# Patient Record
Sex: Male | Born: 1940 | Race: White | Hispanic: No | Smoking: Never smoker
Health system: Southern US, Community
[De-identification: ages and names within clinical notes are randomized; demographics above are authoritative.]

## PROBLEM LIST (undated history)

## (undated) DIAGNOSIS — I10 Essential (primary) hypertension: Secondary | ICD-10-CM

## (undated) DIAGNOSIS — I639 Cerebral infarction, unspecified: Secondary | ICD-10-CM

## (undated) DIAGNOSIS — I679 Cerebrovascular disease, unspecified: Secondary | ICD-10-CM

## (undated) DIAGNOSIS — K4090 Unilateral inguinal hernia, without obstruction or gangrene, not specified as recurrent: Secondary | ICD-10-CM

## (undated) DIAGNOSIS — N400 Enlarged prostate without lower urinary tract symptoms: Secondary | ICD-10-CM

## (undated) DIAGNOSIS — K625 Hemorrhage of anus and rectum: Secondary | ICD-10-CM

## (undated) DIAGNOSIS — G459 Transient cerebral ischemic attack, unspecified: Secondary | ICD-10-CM

## (undated) DIAGNOSIS — N183 Chronic kidney disease, stage 3 unspecified: Secondary | ICD-10-CM

## (undated) DIAGNOSIS — I8390 Asymptomatic varicose veins of unspecified lower extremity: Secondary | ICD-10-CM

## (undated) DIAGNOSIS — I219 Acute myocardial infarction, unspecified: Secondary | ICD-10-CM

## (undated) DIAGNOSIS — I2510 Atherosclerotic heart disease of native coronary artery without angina pectoris: Secondary | ICD-10-CM

## (undated) DIAGNOSIS — M542 Cervicalgia: Secondary | ICD-10-CM

## (undated) DIAGNOSIS — M79609 Pain in unspecified limb: Secondary | ICD-10-CM

## (undated) DIAGNOSIS — M1990 Unspecified osteoarthritis, unspecified site: Secondary | ICD-10-CM

## (undated) DIAGNOSIS — I6529 Occlusion and stenosis of unspecified carotid artery: Secondary | ICD-10-CM

## (undated) DIAGNOSIS — I509 Heart failure, unspecified: Secondary | ICD-10-CM

## (undated) DIAGNOSIS — F4321 Adjustment disorder with depressed mood: Secondary | ICD-10-CM

## (undated) DIAGNOSIS — L408 Other psoriasis: Secondary | ICD-10-CM

## (undated) DIAGNOSIS — K649 Unspecified hemorrhoids: Secondary | ICD-10-CM

## (undated) DIAGNOSIS — Z950 Presence of cardiac pacemaker: Secondary | ICD-10-CM

## (undated) DIAGNOSIS — F419 Anxiety disorder, unspecified: Secondary | ICD-10-CM

## (undated) HISTORY — DX: Benign prostatic hyperplasia without lower urinary tract symptoms: N40.0

## (undated) HISTORY — PX: COLONOSCOPY: SHX174

## (undated) HISTORY — PX: INGUINAL HERNIA REPAIR: SUR1180

## (undated) HISTORY — DX: Pain in unspecified limb: M79.609

## (undated) HISTORY — DX: Occlusion and stenosis of unspecified carotid artery: I65.29

## (undated) HISTORY — DX: Other psoriasis: L40.8

## (undated) HISTORY — DX: Unilateral inguinal hernia, without obstruction or gangrene, not specified as recurrent: K40.90

## (undated) HISTORY — DX: Cervicalgia: M54.2

## (undated) HISTORY — DX: Essential (primary) hypertension: I10

## (undated) HISTORY — DX: Transient cerebral ischemic attack, unspecified: G45.9

## (undated) HISTORY — PX: INSERT / REPLACE / REMOVE PACEMAKER: SUR710

## (undated) HISTORY — PX: VASCULAR SURGERY: SHX849

## (undated) HISTORY — PX: BACK SURGERY: SHX140

## (undated) HISTORY — DX: Unspecified hemorrhoids: K64.9

## (undated) HISTORY — DX: Asymptomatic varicose veins of unspecified lower extremity: I83.90

## (undated) HISTORY — DX: Cerebrovascular disease, unspecified: I67.9

## (undated) HISTORY — DX: Cerebral infarction, unspecified: I63.9

## (undated) HISTORY — PX: VASECTOMY: SHX75

---

## 1990-10-27 HISTORY — PX: LUMBAR DISC SURGERY: SHX700

## 1999-01-07 ENCOUNTER — Inpatient Hospital Stay (HOSPITAL_COMMUNITY): Admission: AD | Admit: 1999-01-07 | Discharge: 1999-01-15 | Payer: Self-pay | Admitting: *Deleted

## 1999-01-17 ENCOUNTER — Encounter (HOSPITAL_COMMUNITY): Admission: RE | Admit: 1999-01-17 | Discharge: 1999-04-17 | Payer: Self-pay

## 2000-02-19 ENCOUNTER — Inpatient Hospital Stay (HOSPITAL_COMMUNITY): Admission: EM | Admit: 2000-02-19 | Discharge: 2000-02-26 | Payer: Self-pay | Admitting: *Deleted

## 2000-02-27 ENCOUNTER — Other Ambulatory Visit: Admission: RE | Admit: 2000-02-27 | Discharge: 2000-03-06 | Payer: Self-pay

## 2002-03-21 ENCOUNTER — Ambulatory Visit (HOSPITAL_COMMUNITY): Admission: RE | Admit: 2002-03-21 | Discharge: 2002-03-21 | Payer: Self-pay | Admitting: Internal Medicine

## 2009-06-16 ENCOUNTER — Inpatient Hospital Stay (HOSPITAL_COMMUNITY): Admission: EM | Admit: 2009-06-16 | Discharge: 2009-06-19 | Payer: Self-pay | Admitting: Emergency Medicine

## 2009-06-18 ENCOUNTER — Encounter (INDEPENDENT_AMBULATORY_CARE_PROVIDER_SITE_OTHER): Payer: Self-pay | Admitting: Neurology

## 2011-02-01 LAB — LIPID PANEL
Cholesterol: 151 mg/dL (ref 0–200)
HDL: 28 mg/dL — ABNORMAL LOW (ref >39)
Total CHOL/HDL Ratio: 5.4 RATIO
VLDL: 42 mg/dL — ABNORMAL HIGH (ref 0–40)

## 2011-02-01 LAB — COMPREHENSIVE METABOLIC PANEL
AST: 30 U/L (ref 0–37)
BUN: 10 mg/dL (ref 6–23)
CO2: 28 mEq/L (ref 19–32)
Calcium: 8.6 mg/dL (ref 8.4–10.5)
Chloride: 105 mEq/L (ref 96–112)
Creatinine, Ser: 1.03 mg/dL (ref 0.4–1.5)
GFR calc Af Amer: >60 mL/min (ref >60)
GFR calc non Af Amer: >60 mL/min (ref >60)
Glucose, Bld: 179 mg/dL — ABNORMAL HIGH (ref 70–99)
Total Bilirubin: 1.4 mg/dL — ABNORMAL HIGH (ref 0.3–1.2)

## 2011-02-01 LAB — CBC
HCT: 42.6 % (ref 39.0–52.0)
MCHC: 34.7 g/dL (ref 30.0–36.0)
MCV: 92.9 fL (ref 78.0–100.0)
RBC: 4.58 MIL/uL (ref 4.22–5.81)
WBC: 12.8 10*3/uL — ABNORMAL HIGH (ref 4.0–10.5)

## 2011-02-01 LAB — GLUCOSE, CAPILLARY
Glucose-Capillary: 107 mg/dL — ABNORMAL HIGH (ref 70–99)
Glucose-Capillary: 121 mg/dL — ABNORMAL HIGH (ref 70–99)
Glucose-Capillary: 128 mg/dL — ABNORMAL HIGH (ref 70–99)

## 2011-02-01 LAB — PROTIME-INR
INR: 1 (ref 0.00–1.49)
Prothrombin Time: 13.4 seconds (ref 11.6–15.2)

## 2011-02-01 LAB — CARDIAC PANEL(CRET KIN+CKTOT+MB+TROPI): Total CK: 207 U/L (ref 7–232)

## 2011-02-01 LAB — HEMOGLOBIN A1C
Hgb A1c MFr Bld: 5.7 % (ref 4.6–6.1)
Mean Plasma Glucose: 117 mg/dL

## 2011-03-11 NOTE — Discharge Summary (Signed)
NAMEHILDRETH, ROBART               ACCOUNT NO.:  [id number]   MEDICAL RECORD NO.:  [id number]          PATIENT TYPE:  INP   LOCATION:  3017                         FACILITY:  [facility]   PHYSICIAN:  Pramod P. Pearlean Brownie, MD    DATE OF BIRTH:  09/24/1941   DATE OF ADMISSION:  06/16/2009  DATE OF DISCHARGE:  06/19/2009                               DISCHARGE SUMMARY   DIAGNOSES AT THE TIME OF DISCHARGE:  1. 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  2. Hypertension.  3. Benign prostatic hypertrophy.  4. Chronic pain involving right lower extremity.  5. Skin rash, bilateral lower extremities.   MEDICINES AT THE TIME OF DISCHARGE:  1. Amlodipine 5 mg a day.  2. Aspirin 325 mg a day.   STUDIES PERFORMED:  1. CT of the brain initially performed at [facility] shows no      acute abnormality.  2. CT angio of the neck shows 35% stenosis right ICA, 50% stenosis      proximal left ICA.  3. CT angio of the head shows mild chronic microvascular ischemic      changes throughout.  No acute hemorrhage, no significant stenosis,      or large vessel occlusion.  4. Chest x-ray shows no acute disease.  5. MRI of the brain shows scattered small acute infarct in left      hemisphere, 2 largest within the left parietal deep white matter,      cluster of infarct in the left occipital lobe 2 mm or smaller, no      sign of hemorrhage, swelling, or mass effect.  There is no chronic      small vessel disease elsewhere.  6. MRA of the head shows intracranial atherosclerotic diseases      affecting the small vessel, correctable proximal stenosis, fetal      origin of posterior cerebral arteries with dimunitive posterior      circulations.  7. A 2-D echocardiogram shows EF of 55% to 60% with no obvious source      of embolus.  8. Carotid Doppler not performed.  9. EKG shows normal sinus rhythm with sinus  arrhythmia, left axis      deviation.  No old EKGs to compare.   LABORATORY STUDIES:  Homocysteine 11.5, cholesterol 151, triglycerides  210, HDL 28, LDL 81, hemoglobin A1c 5.7.  Coagulations on admission  normal.  Chemistry with glucose 179, potassium 3.3, total bilirubin 1.4.  Liver function tests otherwise normal.  Cardiac enzymes with troponin-I  of 0.15 on admission with CK-MB of 4.8, otherwise normal.  CBC with  white blood cells of 12.8, otherwise normal.   HISTORY OF PRESENT ILLNESS:  [name] is a 70 year old  Caucasian male who experienced sudden onset of weakness involving the  right arm and leg as well as slurred speech.  Symptoms worsened by the  time he got to the hospital.  He had some difficulty with walking.  He  had marked weakness in the function  of right upper extremity.  He had no  previous history of stroke or TIA.  He had not been on antiplatelet  therapy.  CT of the brain showed no acute abnormality.  The patient was  deemed to be a candidate for t-PA at [facility] where he was  initially seen.  They said he was last seen normal around 2 p.m.  He  arrived at their hospital within 1 hour of his symptoms.  He was given  two-thirds t-PA and transferred to [facility] for further evaluation  including possible interventional radiology procedure if the patient did  not improve.  Fortunately, the patient did improve on his ride via  [facility] to [employer].  By the time he arrived, his speech was  essentially back to normal and the patient had good strength of his  right upper extremity and right lower extremity.  He was admitted to the  neuro ICU for further evaluation.   HOSPITAL COURSE:  Time of onset was clarified to be 11:30 when the  patient was last seen normal.  The patient first knew he had symptoms at  2 p.m. when he woke from a nap.  He was taken to [facility] where he was  given the two-thirds dose t-PA.  Per Dr. Marca Ancona admitting note  his  plan was to give an additional one-third t-PA upon arrival to [facility].  There is no documentation that this additional one-third was  given.  The drug was not mixed by the pharmacy nor charted given.  In  the neuro ICU, the patient continued to improve.  24 hours post t-PA, he  had an MRI, which showed no hemorrhage.  His deficits almost 100%  resolved when he was moved to the floor.  He was evaluated by PT and OT  and felt to have no rehab needs.  He was placed on aspirin for secondary  stroke prevention and educated on the IRIS trial for which he may be  involved in the future.   CONDITION AT DISCHARGE:  The patient alert and oriented x3.  No aphasia.  No dysarthria.  Eye movements are full.  Face is symmetric.  Tongue is  midline.  He has no drift in his upper extremities.  No focal weakness.  His heart rate is regular.  His breath sounds are clear.   DISCHARGE PLAN:  1. Discharged home with family.  2. No rehab needs.  3. Aspirin for secondary stroke prevention.  4. Consider IRIS trial.  5. Follow up primary care physician within 1 month.  6. Follow up Dr. Pearlean Brownie in 2 months.      Annie Main, N.P.    ______________________________  Sunny Schlein. Pearlean Brownie, MD    SB/MEDQ  D:  06/19/2009  T:  06/20/2009  Job:  [id number]

## 2011-03-11 NOTE — H&P (Signed)
NAMEJACARI, [name] NO.:  [id number]   MEDICAL RECORD NO.:  [id number]          PATIENT TYPE:  INP   LOCATION:  3111                         FACILITY:  [facility]   PHYSICIAN:  Noel Christmas, MD    DATE OF BIRTH:  [birth year]   DATE OF ADMISSION:  06/16/2009  DATE OF DISCHARGE:                              HISTORY & PHYSICAL   CHIEF COMPLAINT:  Acute onset of right-sided weakness and speech  difficulty.   HISTORY OF PRESENT ILLNESS:  This is a 70 year old man who experienced  sudden onset of weakness involving his right arm and right leg as well  as slurred speech.  Symptoms worsened by the time he came to the  hospital.  He had some difficulty with walking.  He had marked weakness  and dysfunction of his right upper extremity.  No previous history of  stroke or TIA.  He has not been on antiplatelet therapy.  CT scan of his  head showed no acute intracranial abnormality.  The patient was deemed a  candidate for t-PA.  He had last been seen normal at 2 o'clock this  afternoon.  He arrived at the hospital within 1 hour of onset of his  symptoms.  He was given t-PA at two-thirds dose with plans to transfer  to [facility] for further evaluation including possible  interventional radiology procedure if the patient did not improve  significantly with t-PA.  Fortunately, he did improve significantly with  marked improvement in speech as well as right-sided weakness.  By the  time he arrived in the emergency room at [facility], his speech was  essentially back to normal and the patient had good strength of his  right upper extremity as well as right lower extremity.  His only  complaint was residual feeling of numbness in his right upper extremity.   PAST MEDICAL HISTORY:  Remarkable for,  1. Hypertension.  2. Benign prostate hypertrophy.  3. Chronic pain involving his right lower extremity.  4. The patient is also being treated for a skin rash distally in  both      lower extremities.   CURRENT MEDICATIONS:  1. Amlodipine 5 mg per day.  2. Doxycycline 100 mg b.i.d.  3. Darvocet-N 100 one q.4 h. p.r.n. pain.   ALLERGIES:  PENICILLIN.   FAMILY HISTORY:  Positive for coronary artery disease and myocardial  infarctions as well as diabetes mellitus.  Family history is negative  for stroke.   SOCIAL HISTORY:  The patient is widowed.  He is employed as a Holiday representative at  [employer].  There is no history of tobacco abuse nor consumption of  alcohol.  There is also no history of illicit drug use.   REVIEW OF SYSTEMS:  NEUROLOGIC:  As above.  CARDIOVASCULAR:  Negative.  PULMONARY:  Negative.  GASTROINTESTINAL:  Negative.  GENITOURINARY:  Negative except for symptoms of BPH with reduced urinary stream.  MUSCULOSKELETAL:  Chronic right lower extremity pain.  ENDOCRINE:  Negative.  REPRODUCTIVE:  Negative.  HEMATOLOGIC/LYMPHATIC:  Negative.  IMMUNOLOGIC:  Negative. PSYCHIATRIC:  Negative.   PHYSICAL EXAMINATION:  VITAL SIGNS: Temperature was 97.5, blood pressure  141/88, heart rate 61 per minute and regular, respirations 12 per  minute, and oxygen saturation was 98% on 2 L of oxygen by nasal cannula.  GENERAL:  Appearance was that of moderately obese, late middle-aged  slightly elderly-appearing man who was alert and cooperative and in no  acute distress.  He had no receptive nor expressive aphasia.  Mental  status was normal.  HEAD, EYES, EARS, NOSE, AND THROAT:  Normal.  NECK:  Supple without tenderness.  CHEST:  Clear to auscultation.  CARDIAC:  Normal.  ABDOMEN:  Somewhat distended, but soft and nontender.  Bowel sounds were  normal.  SKIN:  Skin and mucosa were normal except for ring distribution area of  erythema involving both lower extremities, the distal legs.  EXTREMITIES:  Normal.  He had 2+ dorsal pedis pulses bilaterally.  GU:  Evaluation of genitalia and rectum was deferred.  NEUROLOGIC:  Pupils were equal.  Extraocular movements  and visual fields  were normal.  There is no facial numbness nor facial weakness.  Hearing  and speech were normal.  Coordination of his extremities was normal.  He  had no pronator drift of his upper extremities.  Strength was normal on  manual testing bilaterally.  Deep tendon reflexes were 2+ and  symmetrical in the upper extremities at the knees and absent at ankles.  Plantar responses were mute.  Sensory exam was normal except for reduced  tactile sensation of his right upper extremity compared to the left.  Carotid auscultation was normal.   LABORATORY STUDIES:  WBC count was 10.7, hemoglobin 15.1, hematocrit  43.9, and platelet count was 286,000.  Serum sodium was 138, potassium  3.9, chloride 106, CO2 of 21, BUN 13, creatinine 0.99, glucose 105, and  calcium 9.08.   IMPRESSION:  Acute ischemic event, transient ischemic attack versus  stroke, involving the left middle cerebral artery territory.  The  patient has had a good response to t-PA.   PLAN:  1. We will give remaining one-third of t-PA IV.  2. MRI of the brain.  3. CTA of the head as well as of the neck to rule out any significant      signs of occlusive disease.  4. Echocardiogram.  5. Aspirin daily after 48 hours.  6. No indications for rehab intervention at this point.      Noel Christmas, MD  Electronically Signed     CS/MEDQ  D:  06/16/2009  T:  06/17/2009  Job:  [id number]

## 2011-03-14 NOTE — Op Note (Signed)
[facility]  Patient:    [name], [name] Visit Number: [id number] MRN: [id number]          Service Type: END Location: DAY Attending Physician:  [name] Dictated by:   [name], [name] Proc. Date: 03/21/02 Admit Date:  03/21/2002   CC:         [name], [name], [place], [place]   Operative Report  PROCEDURE:  Diagnostic EGD, followed by colonoscopy.  INDICATIONS:  The patient is a 70 year old gentleman referred at the courtesy of Dr. Christian Jefferson to further evaluate hematochezia and intermittent dark stools per rectum.  He moves his bowels on the order of 2-3 times daily.  He denies constipation.  He denies frank diarrhea.  He has not had any upper abdominal symptoms such as odynophagia, dysphagia, early satiety, reflux symptoms, nausea, or vomiting.  He has not lost any weight.  He tells me he had some rectal bleeding a couple of years ago for which he underwent a colonoscopy at [facility]. No significant findings were given.  There is no family history of colorectal neoplasia.  He not taking any nonsteroidal agents.  EGD and colonoscopy are now being done to further evaluate his symptoms.  It is notable that on Mar 18, 2002, his white count was 11.3.  H and H was 13.4 and 38.3.  MCV was 87.9.  Platelet count was 316,000.  This approach has been discussed with [name].  The potential risks, benefits, and alternatives have been reviewed and questions answered.  He is at low risk for conscious sedation.  Please see my handwritten for more information.  PROCEDURE NOTE:  O2 saturation, blood pressure, pulse, and respirations were monitored throughout the entire procedure.  Conscious sedation--Versed 3 mg IV, Demerol 75 mg IV in divided doses, Cetacaine spray for topical oropharyngeal anesthesia.  INSTRUMENTS:  Olympus video chip gastroscope and colonoscope.  EGD FINDINGS:  Examination of the tubular esophagus revealed no  mucosal abnormalities.  The EG junction was easily traversed in the stomach.  The gastric cavity was emptied and insufflated well with air.  Thorough examination of the gastric mucosa including retroflexed view of the proximal stomach and esophagogastric junction demonstrated no abnormalities.  The pylorus was patent and easily traversed.  DUODENUM:  The bulb and second portion appeared normal.  THERAPEUTIC/DIAGNOSTIC MANEUVERS PERFORMED:  None.  The patient tolerated the procedure well and was prepared for colonoscopy.  COLONOSCOPY:  Digital rectal examination revealed no abnormalities.  ENDOSCOPIC FINDINGS:  The prep was good.  RECTUM:  Examination of the rectal mucosa including retroflexed view of the anal verge revealed friable internal hemorrhoidal tissue as did the on-face view of the anal canal.  The rectal mucosa otherwise appeared normal.  COLON: The colonic mucosa was surveyed from the rectosigmoid junction through the left transverse and right colon to the area of the appendiceal orifice. The colonic mucosa all the way to the cecum appeared normal.  The cecum, ileocecal valve, and appendiceal orifice were well-seen and photographed for the record.  The terminal ileum, the distal 10 cm, appeared normal.  From this level, the scope was slowly withdrawn.  All previously mentioned mucosal surfaces were again seen, and no other abnormalities were observed.  The patient tolerated both procedures well and was reacted in endoscopy.  IMPRESSION:  Esophagogastroduodenoscopy, normal esophagus, stomach, and duodenum through the second portion.  COLONOSCOPY FINDINGS: 1. Anal canal hemorrhoids; otherwise, normal rectum. 2. Normal colon. 3. Normal terminal ileum.  I suspect the  patient has experienced some bleeding largely from the inner rectum.  RECOMMENDATIONS: 1. Hemorrhoid literature given to [name]. 2. A 10-day course of Anusol-HC suppositories, one per rectum at  bedtime. 3. Followup with [name]. 4. If bleeding recurs, he is to let me know. Dictated by:   [name], [name] Attending Physician:  [name] DD:  03/21/02 TD:  03/22/02 Job: [id number]
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
## 2013-06-07 DIAGNOSIS — R1310 Dysphagia, unspecified: Secondary | ICD-10-CM

## 2013-06-17 ENCOUNTER — Encounter (INDEPENDENT_AMBULATORY_CARE_PROVIDER_SITE_OTHER): Payer: Self-pay | Admitting: *Deleted

## 2013-06-24 ENCOUNTER — Other Ambulatory Visit: Payer: Self-pay | Admitting: *Deleted

## 2013-06-24 ENCOUNTER — Other Ambulatory Visit (INDEPENDENT_AMBULATORY_CARE_PROVIDER_SITE_OTHER): Payer: Self-pay | Admitting: *Deleted

## 2013-06-24 DIAGNOSIS — I6350 Cerebral infarction due to unspecified occlusion or stenosis of unspecified cerebral artery: Secondary | ICD-10-CM

## 2013-06-24 DIAGNOSIS — Z1211 Encounter for screening for malignant neoplasm of colon: Secondary | ICD-10-CM

## 2013-06-28 ENCOUNTER — Encounter: Payer: Self-pay | Admitting: Surgery

## 2013-07-01 ENCOUNTER — Encounter: Payer: Self-pay | Admitting: Surgery

## 2013-07-04 ENCOUNTER — Encounter: Payer: Self-pay | Admitting: Surgery

## 2013-07-04 ENCOUNTER — Ambulatory Visit (INDEPENDENT_AMBULATORY_CARE_PROVIDER_SITE_OTHER): Payer: Medicare Other | Admitting: Surgery

## 2013-07-04 ENCOUNTER — Other Ambulatory Visit: Payer: Self-pay

## 2013-07-04 ENCOUNTER — Other Ambulatory Visit (INDEPENDENT_AMBULATORY_CARE_PROVIDER_SITE_OTHER): Payer: Medicare Other | Admitting: *Deleted

## 2013-07-04 DIAGNOSIS — I6350 Cerebral infarction due to unspecified occlusion or stenosis of unspecified cerebral artery: Secondary | ICD-10-CM

## 2013-07-04 DIAGNOSIS — Z01810 Encounter for preprocedural cardiovascular examination: Secondary | ICD-10-CM

## 2013-07-04 DIAGNOSIS — I6529 Occlusion and stenosis of unspecified carotid artery: Secondary | ICD-10-CM

## 2013-07-04 MED ORDER — ATORVASTATIN CALCIUM 10 MG PO TABS: 10.0000 mg | ORAL_TABLET | Freq: Every day | ORAL | Status: DC

## 2013-07-04 NOTE — Progress Notes (Addendum)
[facility] [place]   Patient name: [name] MRN: [id number] DOB: 1941-07-31 Sex: male   Referred by: Dr. Sherryll Burger  Reason for referral:  Chief Complaint  Patient presents with  . Carotid    new pt, carotid stenosis - Dr. Sherryll Burger    HISTORY OF PRESENT ILLNESS: This is a very pleasant gentleman who was referred for evaluation of carotid stenosis in the setting of a stroke. The patient has a history of a left brain stroke in 2010. At that time he presented with right-sided weakness and slurred speech. He was given TPA. His symptoms resolved. He had a CT angiogram which showed 30% right carotid stenosis and 50% left carotid stenosis. In early August of this year the patient had a similar episode with similar symptoms. An MRI showed a small left brain infarct without mass effect or hemorrhage. Duplex ultrasound showed greater than 70% bilateral carotid stenosis He was started on Plavix. He is continuing to get rehabilitation and physical therapy. His strength has improved. He still has residual numbness in his right arm.  The patient is medically managed for his hypertension. He is now taking Plavix as his antiplatelet agent. He stopped his aspirin. The patient is a nonsmoker.  Past Medical History  Diagnosis Date  . Carotid artery occlusion   . Cervicalgia   . Depressive disorder, not elsewhere classified   . BPH (benign prostatic hypertrophy)   . Pain in limb   . Unspecified transient cerebral ischemia   . Varicose veins   . Inguinal hernia without mention of obstruction or gangrene, unilateral or unspecified, (not specified as recurrent)   . Unspecified hemorrhoids without mention of complication   . Cerebrovascular disease, unspecified   . Hypertension   . Other psoriasis   . Backache, unspecified   . Stroke     Past Surgical History  Procedure Laterality Date  . Spine surgery  1992    Dr. Arletha Grippe  . Back surgery    . Hernia repair      History    Social History  . Marital Status: Widowed    Spouse Name: N/A    Number of Children: N/A  . Years of Education: N/A   Occupational History  . Not on file.   Social History Main Topics  . Smoking status: Never Smoker   . Smokeless tobacco: Never Used  . Alcohol Use: No  . Drug Use: No  . Sexual Activity: Not on file   Other Topics Concern  . Not on file   Social History Narrative  . No narrative on file    Family History  Problem Relation Age of Onset  . Heart attack Father   . Cancer Daughter     Allergies as of 07/04/2013 - Review Complete 07/04/2013  Allergen Reaction Noted  . Ace inhibitors  06/28/2013  . Cardura [doxazosin mesylate] Other (See Comments) 06/28/2013  . Penicillins Rash 06/28/2013    Current Outpatient Prescriptions on File Prior to Visit  Medication Sig Dispense Refill  . amLODipine (NORVASC) 10 MG tablet Take 10 mg by mouth daily.      . clopidogrel (PLAVIX) 75 MG tablet Take 75 mg by mouth daily.      . finasteride (PROSCAR) 5 MG tablet Take 5 mg by mouth daily.      . hydrochlorothiazide (HYDRODIURIL) 25 MG tablet Take 25 mg by mouth daily.      . hydrocortisone (ANUSOL-HC) 2.5 % rectal cream Place rectally 2 (two) times  daily.      . traMADol (ULTRAM) 50 MG tablet Take 50 mg by mouth as needed.       Marland Kitchen aspirin 81 MG tablet Take 81 mg by mouth daily.      . betamethasone dipropionate (DIPROLENE) 0.05 % cream Apply topically 2 (two) times daily.      . fexofenadine (ALLEGRA) 180 MG tablet Take 180 mg by mouth daily.      . metoprolol succinate (TOPROL-XL) 25 MG 24 hr tablet Take 25 mg by mouth daily.      . sertraline (ZOLOFT) 25 MG tablet Take 25 mg by mouth daily.       No current facility-administered medications on file prior to visit.     REVIEW OF SYSTEMS: Positive for pain in his legs with walking as well as swelling. Positive for right arm and leg weakness, slurred speech, dizziness. Positive for blood in his stool.  All other  systems are negative  PHYSICAL EXAMINATION: General: The patient appears their stated age.  Vital signs are BP 139/81  Pulse 75  Ht 5\' 8"  (1.727 m)  Wt 218 lb (98.884 kg)  BMI 33.15 kg/m2  SpO2 96% HEENT:  No gross abnormalities Pulmonary: Respirations are non-labored Abdomen: Soft and non-tender  Musculoskeletal: There are no major deformities.   Neurologic: Right upper extremity and lower extremity weakness Skin: There are no ulcer or rashes noted. Psychiatric: The patient has normal affect. Cardiovascular: There is a regular rate and rhythm without significant murmur appreciated. Palpable pedal pulses. No carotid bruit  Diagnostic Studies: Carotid duplex was repeated here today. This shows greater than 80% bilateral carotid stenosis. He has normal anatomy with a high bifurcation  Medication Changes: I started the patient on Lipitor 10 mg today. I also restarted his baby aspirin  Assessment:  Symptomatic left carotid stenosis Plan: The patient is approximately one month out from a left brain stroke. Imaging studies have revealed bilateral carotid stenosis greater than 80%. For stroke prevention, I have recommended left carotid endarterectomy and possible right carotid endarterectomy at a later date. We discussed the risks and benefits of surgery which include but are not limited to the risk of stroke, the risk of nerve injury, bleeding, cardiopulmonary complications. He is not on a statin, and therefore I started him on Lipitor. In addition I told him to continue taking his Plavix, but restart his baby aspirin. I have scheduled his left carotid endarterectomy for Thursday, September 18. The patient is going out of town this weekend and once to have his operation following his trip to [place]     V. Charlena Cross, [name] [facility] [place] Office: [phone] Pager:  [phone]

## 2013-07-07 ENCOUNTER — Encounter (HOSPITAL_COMMUNITY): Payer: Self-pay | Admitting: Respiratory Therapy

## 2013-07-12 ENCOUNTER — Encounter (HOSPITAL_COMMUNITY): Payer: Self-pay

## 2013-07-12 ENCOUNTER — Encounter (HOSPITAL_COMMUNITY)
Admission: RE | Admit: 2013-07-12 | Discharge: 2013-07-12 | Disposition: A | Discharge status: Home / Self Care | Payer: Medicare Other | Source: Ambulatory Visit | Attending: Surgery | Admitting: Surgery

## 2013-07-12 ENCOUNTER — Encounter (HOSPITAL_COMMUNITY)
Admission: RE | Admit: 2013-07-12 | Discharge: 2013-07-12 | Disposition: A | Discharge status: Home / Self Care | Payer: Medicare Other | Source: Ambulatory Visit | Attending: Anesthesiology | Admitting: Anesthesiology

## 2013-07-12 HISTORY — DX: Anxiety disorder, unspecified: F41.9

## 2013-07-12 HISTORY — DX: Unspecified osteoarthritis, unspecified site: M19.90

## 2013-07-12 HISTORY — DX: Benign prostatic hyperplasia without lower urinary tract symptoms: N40.0

## 2013-07-12 LAB — URINALYSIS, ROUTINE W REFLEX MICROSCOPIC
Bilirubin Urine: NEGATIVE
Hgb urine dipstick: NEGATIVE
Nitrite: NEGATIVE
Protein, ur: NEGATIVE mg/dL
Urobilinogen, UA: 0.2 mg/dL (ref 0.0–1.0)

## 2013-07-12 LAB — CBC
Hemoglobin: 15.4 g/dL (ref 13.0–17.0)
MCH: 31.6 pg (ref 26.0–34.0)
MCHC: 35.4 g/dL (ref 30.0–36.0)
Platelets: 324 10*3/uL (ref 150–400)
RDW: 12.9 % (ref 11.5–15.5)

## 2013-07-12 LAB — TYPE AND SCREEN
ABO/RH(D): O POS
Antibody Screen: NEGATIVE

## 2013-07-12 LAB — COMPREHENSIVE METABOLIC PANEL
ALT: 20 U/L (ref 0–53)
Albumin: 4.5 g/dL (ref 3.5–5.2)
Calcium: 9.8 mg/dL (ref 8.4–10.5)
GFR calc Af Amer: 58 mL/min — ABNORMAL LOW (ref >90)
Glucose, Bld: 113 mg/dL — ABNORMAL HIGH (ref 70–99)
Potassium: 3 mEq/L — ABNORMAL LOW (ref 3.5–5.1)
Sodium: 136 mEq/L (ref 135–145)
Total Protein: 8.2 g/dL (ref 6.0–8.3)

## 2013-07-12 LAB — APTT: aPTT: 31 seconds (ref 24–37)

## 2013-07-12 NOTE — Progress Notes (Signed)
Denies seeing a cardiologist. Denies having a card cath, stress test, ekg, cxr, or echo. PCP is Dr Kirstie Peri Dr [facility] office called (spoke with Darel Hong) about what time the pt needs to be here for surgery.  Darel Hong states that pt could be here at 0930 instead of 0730 as instructed before.  Pt voices understanding of pre-admit orders and the time to be here is 0930.

## 2013-07-12 NOTE — Pre-Procedure Instructions (Addendum)
[name]  07/12/2013   Your procedure is scheduled on:  Sept 18 1135  Report to [facility] at 0930 AM.  Call this number if you have problems the morning of surgery: [phone]   Remember:   Do not eat food or drink liquids after midnight.   Take these medicines the morning of surgery with A SIP OF WATER: Norvasc( amlodipine)  , Proscar (finasteride), Plavix, and Aspirin as directed by your Dr.  Stop taking Aleve, BC's, Goody's, Ibuprofen, Herbal medications, and Fish oil.   Do not wear jewelry, make-up or nail polish.  Do not wear lotions, powders, or perfumes. You may wear deodorant.  Do not shave 48 hours prior to surgery. Men may shave face and neck.  Do not bring valuables to the hospital.  [facility] is not responsible                   for any belongings or valuables.  Contacts, dentures or bridgework may not be worn into surgery.  Leave suitcase in the car. After surgery it may be brought to your room.  For patients admitted to the hospital, checkout time is 11:00 AM the day of  discharge.   Patients discharged the day of surgery will not be allowed to drive  home.    Special Instructions: Shower using CHG 2 nights before surgery and the night before surgery.  If you shower the day of surgery use CHG.  Use special wash - you have one bottle of CHG for all showers.  You should use approximately 1/3 of the bottle for each shower.   Please read over the following fact sheets that you were given: Pain Booklet, Coughing and Deep Breathing, Blood Transfusion Information, MRSA Information and Surgical Site Infection Prevention

## 2013-07-13 ENCOUNTER — Encounter (HOSPITAL_COMMUNITY): Payer: Self-pay

## 2013-07-13 ENCOUNTER — Other Ambulatory Visit: Payer: Self-pay | Admitting: *Deleted

## 2013-07-13 DIAGNOSIS — Z22322 Carrier or suspected carrier of Methicillin resistant Staphylococcus aureus: Secondary | ICD-10-CM

## 2013-07-13 MED ORDER — MUPIROCIN 2 % EX OINT: TOPICAL_OINTMENT | CUTANEOUS | Status: DC

## 2013-07-13 MED ORDER — VANCOMYCIN HCL IN DEXTROSE 1-5 GM/200ML-% IV SOLN
1000.0000 mg | INTRAVENOUS | Status: AC
  Administered 2013-07-14: 1000 mg via INTRAVENOUS
  Filled 2013-07-13: qty 200

## 2013-07-13 NOTE — Progress Notes (Signed)
Anesthesia Chart Review:  Patient is a 72 year old male scheduled for left CEA on 07/14/13 by Dr. Myra Gianotti.  History includes left brain CVA '10 s/p TPA and 05/2013 with mild residual RLE weakness, depression, anxiety, HTN, back and hernia surgeris, BPH, psoriasis.  PCP is Dr. Kirstie Peri.  EKG on 07/12/13 showed NSR, LAD, reverse r wave progression in V2-V3.  He denies history of MI, CHF, known CAD, prior stress or cath.  Echo on 06/23/13 (ordered by Dr. Sherryll Burger @ [facility]): Left ventricle normal in size and shape, EF 60-65%, abnormal diastolic function with E to A reversal, question of grade 1 diastolic dysfunction.  Grossly normal size RV with suspected prominent moderator band.  Mildly thickened MV with trace MR.  Mild TR, RVSP 20-30 mm Hg.  Mild to moderate AV sclerosis.  Aortic root is mildly sclerotic but normal in size.  Interatrial septum was not well seen. Consider repeat echo in 2-3 years or as clinically indicated, consider cardiac MRI to exclude significant pathology involving RV moderator band, consider TEE with saline contrast (Dr. Johny Shears).  Carotid duplex on 07/04/13 showed > 80% bilateral ICA stenosis.  CXR on 07/12/13 showed no acute cardiopulmonary abnormality seen.  Preoperative labs noted.  WBC 16.2, Cr 1.35, glucose 113. UA WNL.  Patient denies fever, cough, diarrhea.  He denies any "sick" symptoms.  Patient has mild leukocytosis but no obvious symptoms of infection.  Echo findings reviewed with anesthesiologist Dr. Sampson Goon.  Patient denies known cardiac history.  He will be further evaluated by his assigned anesthesiologist on the day of surgery.  If no acute changes then I would anticipate that he could proceed from an anesthesia standpoint.  Velna Ochs [facility]/Anesthesiology Phone [phone] 07/13/2013 12:20 PM

## 2013-07-14 ENCOUNTER — Encounter (HOSPITAL_COMMUNITY)
Admission: RE | Disposition: A | Discharge status: Home / Self Care | Payer: Self-pay | Source: Ambulatory Visit | Attending: Surgery

## 2013-07-14 ENCOUNTER — Inpatient Hospital Stay (HOSPITAL_COMMUNITY)
Admission: RE | Admit: 2013-07-14 | Discharge: 2013-07-16 | DRG: 038 | Disposition: A | Discharge status: Home / Self Care | Payer: Medicare Other | Source: Ambulatory Visit | Attending: Surgery | Admitting: Surgery

## 2013-07-14 ENCOUNTER — Encounter (HOSPITAL_COMMUNITY): Payer: Self-pay | Admitting: Surgery

## 2013-07-14 ENCOUNTER — Inpatient Hospital Stay (HOSPITAL_COMMUNITY): Payer: Medicare Other | Admitting: Anesthesiology

## 2013-07-14 ENCOUNTER — Encounter (HOSPITAL_COMMUNITY): Payer: Self-pay | Admitting: Vascular Surgery

## 2013-07-14 DIAGNOSIS — Z8673 Personal history of transient ischemic attack (TIA), and cerebral infarction without residual deficits: Secondary | ICD-10-CM

## 2013-07-14 DIAGNOSIS — I8390 Asymptomatic varicose veins of unspecified lower extremity: Secondary | ICD-10-CM | POA: Diagnosis present

## 2013-07-14 DIAGNOSIS — F3289 Other specified depressive episodes: Secondary | ICD-10-CM | POA: Diagnosis present

## 2013-07-14 DIAGNOSIS — I6529 Occlusion and stenosis of unspecified carotid artery: Secondary | ICD-10-CM

## 2013-07-14 DIAGNOSIS — K4090 Unilateral inguinal hernia, without obstruction or gangrene, not specified as recurrent: Secondary | ICD-10-CM | POA: Diagnosis present

## 2013-07-14 DIAGNOSIS — I10 Essential (primary) hypertension: Secondary | ICD-10-CM | POA: Diagnosis present

## 2013-07-14 DIAGNOSIS — F329 Major depressive disorder, single episode, unspecified: Secondary | ICD-10-CM | POA: Diagnosis present

## 2013-07-14 DIAGNOSIS — Z7902 Long term (current) use of antithrombotics/antiplatelets: Secondary | ICD-10-CM

## 2013-07-14 DIAGNOSIS — L408 Other psoriasis: Secondary | ICD-10-CM | POA: Diagnosis present

## 2013-07-14 DIAGNOSIS — N400 Enlarged prostate without lower urinary tract symptoms: Secondary | ICD-10-CM | POA: Diagnosis present

## 2013-07-14 DIAGNOSIS — D62 Acute posthemorrhagic anemia: Secondary | ICD-10-CM | POA: Diagnosis present

## 2013-07-14 DIAGNOSIS — E876 Hypokalemia: Secondary | ICD-10-CM | POA: Diagnosis present

## 2013-07-14 DIAGNOSIS — I658 Occlusion and stenosis of other precerebral arteries: Secondary | ICD-10-CM | POA: Diagnosis present

## 2013-07-14 DIAGNOSIS — R4789 Other speech disturbances: Secondary | ICD-10-CM

## 2013-07-14 HISTORY — PX: PATCH ANGIOPLASTY: SHX6230

## 2013-07-14 HISTORY — PX: ENDARTERECTOMY: SHX5162

## 2013-07-14 LAB — CREATININE, SERUM
Creatinine, Ser: 1.19 mg/dL (ref 0.50–1.35)
GFR calc Af Amer: 68 mL/min — ABNORMAL LOW (ref >90)
GFR calc non Af Amer: 58 mL/min — ABNORMAL LOW (ref >90)

## 2013-07-14 LAB — CBC
Platelets: 248 10*3/uL (ref 150–400)
RBC: 3.85 MIL/uL — ABNORMAL LOW (ref 4.22–5.81)
RDW: 12.9 % (ref 11.5–15.5)
WBC: 20.9 10*3/uL — ABNORMAL HIGH (ref 4.0–10.5)

## 2013-07-14 SURGERY — ENDARTERECTOMY, CAROTID
Anesthesia: General | Site: Neck | Laterality: Left | Wound class: Clean

## 2013-07-14 MED ORDER — SODIUM CHLORIDE 0.9 % IV SOLN
INTRAVENOUS | Status: DC
  Administered 2013-07-14: 18:00:00 via INTRAVENOUS

## 2013-07-14 MED ORDER — SUCCINYLCHOLINE CHLORIDE 20 MG/ML IJ SOLN
INTRAMUSCULAR | Status: DC | PRN
  Administered 2013-07-14: 120 mg via INTRAVENOUS

## 2013-07-14 MED ORDER — VANCOMYCIN HCL IN DEXTROSE 1-5 GM/200ML-% IV SOLN
1000.0000 mg | Freq: Once | INTRAVENOUS | Status: AC
  Administered 2013-07-15: 1000 mg via INTRAVENOUS
  Filled 2013-07-14: qty 200

## 2013-07-14 MED ORDER — TRIAMCINOLONE ACETONIDE 0.5 % EX CREA: TOPICAL_CREAM | Freq: Two times a day (BID) | CUTANEOUS | Status: DC | PRN

## 2013-07-14 MED ORDER — CLOPIDOGREL BISULFATE 75 MG PO TABS
75.0000 mg | ORAL_TABLET | Freq: Every day | ORAL | Status: DC
  Administered 2013-07-15 – 2013-07-16 (×2): 75 mg via ORAL
  Filled 2013-07-14 (×3): qty 1

## 2013-07-14 MED ORDER — 0.9 % SODIUM CHLORIDE (POUR BTL) OPTIME
TOPICAL | Status: DC | PRN
  Administered 2013-07-14: 2000 mL

## 2013-07-14 MED ORDER — NEOSTIGMINE METHYLSULFATE 1 MG/ML IJ SOLN
INTRAMUSCULAR | Status: DC | PRN
  Administered 2013-07-14: 4 mg via INTRAVENOUS

## 2013-07-14 MED ORDER — METOPROLOL TARTRATE 1 MG/ML IV SOLN: 2.0000 mg | INTRAVENOUS | Status: DC | PRN

## 2013-07-14 MED ORDER — PROPOFOL 10 MG/ML IV BOLUS
INTRAVENOUS | Status: DC | PRN
  Administered 2013-07-14: 200 mg via INTRAVENOUS
  Administered 2013-07-14: 20 mg via INTRAVENOUS

## 2013-07-14 MED ORDER — PHENOL 1.4 % MT LIQD: 1.0000 | OROMUCOSAL | Status: DC | PRN

## 2013-07-14 MED ORDER — ASPIRIN EC 81 MG PO TBEC
81.0000 mg | DELAYED_RELEASE_TABLET | Freq: Every day | ORAL | Status: DC
  Administered 2013-07-15 – 2013-07-16 (×2): 81 mg via ORAL
  Filled 2013-07-14 (×2): qty 1

## 2013-07-14 MED ORDER — PHENYLEPHRINE HCL 10 MG/ML IJ SOLN: 30.0000 ug/min | INTRAVENOUS | Status: DC

## 2013-07-14 MED ORDER — OXYCODONE-ACETAMINOPHEN 5-325 MG PO TABS
1.0000 | ORAL_TABLET | ORAL | Status: DC | PRN
  Administered 2013-07-15: 1 via ORAL
  Filled 2013-07-14: qty 1

## 2013-07-14 MED ORDER — METOPROLOL SUCCINATE ER 25 MG PO TB24
25.0000 mg | ORAL_TABLET | Freq: Every day | ORAL | Status: DC
  Administered 2013-07-15 – 2013-07-16 (×2): 25 mg via ORAL
  Filled 2013-07-14 (×3): qty 1

## 2013-07-14 MED ORDER — PROMETHAZINE HCL 25 MG/ML IJ SOLN: 6.2500 mg | INTRAMUSCULAR | Status: DC | PRN

## 2013-07-14 MED ORDER — SODIUM CHLORIDE 0.9 % IV SOLN
500.0000 mL | Freq: Once | INTRAVENOUS | Status: AC | PRN
  Administered 2013-07-14: 500 mL via INTRAVENOUS

## 2013-07-14 MED ORDER — LIDOCAINE HCL (PF) 1 % IJ SOLN
INTRAMUSCULAR | Status: AC
  Filled 2013-07-14: qty 30

## 2013-07-14 MED ORDER — LORATADINE 10 MG PO TABS
10.0000 mg | ORAL_TABLET | Freq: Every day | ORAL | Status: DC
  Administered 2013-07-14 – 2013-07-16 (×3): 10 mg via ORAL
  Filled 2013-07-14 (×3): qty 1

## 2013-07-14 MED ORDER — ACETAMINOPHEN 650 MG RE SUPP: 325.0000 mg | RECTAL | Status: DC | PRN

## 2013-07-14 MED ORDER — DEXTROSE 5 % IV SOLN
30.0000 ug/min | INTRAVENOUS | Status: AC
  Filled 2013-07-14: qty 2

## 2013-07-14 MED ORDER — MORPHINE SULFATE 2 MG/ML IJ SOLN
2.0000 mg | INTRAMUSCULAR | Status: DC | PRN
  Administered 2013-07-14: 4 mg via INTRAVENOUS
  Filled 2013-07-14: qty 2

## 2013-07-14 MED ORDER — OXYCODONE HCL 5 MG/5ML PO SOLN: 5.0000 mg | Freq: Once | ORAL | Status: DC | PRN

## 2013-07-14 MED ORDER — PROTAMINE SULFATE 10 MG/ML IV SOLN
INTRAVENOUS | Status: DC | PRN
  Administered 2013-07-14: 50 mg via INTRAVENOUS

## 2013-07-14 MED ORDER — SODIUM CHLORIDE 0.9 % IR SOLN
Status: DC | PRN
  Administered 2013-07-14: 13:00:00

## 2013-07-14 MED ORDER — ATORVASTATIN CALCIUM 10 MG PO TABS
10.0000 mg | ORAL_TABLET | Freq: Every day | ORAL | Status: DC
  Administered 2013-07-15 – 2013-07-16 (×2): 10 mg via ORAL
  Filled 2013-07-14 (×2): qty 1

## 2013-07-14 MED ORDER — DOPAMINE-DEXTROSE 3.2-5 MG/ML-% IV SOLN: 3.0000 ug/kg/min | INTRAVENOUS | Status: DC

## 2013-07-14 MED ORDER — ALBUMIN HUMAN 5 % IV SOLN
INTRAVENOUS | Status: DC | PRN
  Administered 2013-07-14 (×2): via INTRAVENOUS

## 2013-07-14 MED ORDER — LABETALOL HCL 5 MG/ML IV SOLN: 10.0000 mg | INTRAVENOUS | Status: DC | PRN

## 2013-07-14 MED ORDER — ROCURONIUM BROMIDE 100 MG/10ML IV SOLN
INTRAVENOUS | Status: DC | PRN
  Administered 2013-07-14: 20 mg via INTRAVENOUS
  Administered 2013-07-14: 50 mg via INTRAVENOUS
  Administered 2013-07-14: 10 mg via INTRAVENOUS

## 2013-07-14 MED ORDER — HYDRALAZINE HCL 20 MG/ML IJ SOLN: 10.0000 mg | INTRAMUSCULAR | Status: DC | PRN

## 2013-07-14 MED ORDER — LACTATED RINGERS IV SOLN
INTRAVENOUS | Status: DC | PRN
  Administered 2013-07-14 (×2): via INTRAVENOUS

## 2013-07-14 MED ORDER — POTASSIUM CHLORIDE CRYS ER 20 MEQ PO TBCR: 20.0000 meq | EXTENDED_RELEASE_TABLET | Freq: Once | ORAL | Status: AC | PRN

## 2013-07-14 MED ORDER — LIDOCAINE HCL (CARDIAC) 20 MG/ML IV SOLN
INTRAVENOUS | Status: DC | PRN
  Administered 2013-07-14: 80 mg via INTRAVENOUS

## 2013-07-14 MED ORDER — SODIUM CHLORIDE 0.9 % IV SOLN: INTRAVENOUS | Status: DC

## 2013-07-14 MED ORDER — GUAIFENESIN-DM 100-10 MG/5ML PO SYRP: 15.0000 mL | ORAL_SOLUTION | ORAL | Status: DC | PRN

## 2013-07-14 MED ORDER — ONDANSETRON HCL 4 MG/2ML IJ SOLN: 4.0000 mg | Freq: Four times a day (QID) | INTRAMUSCULAR | Status: DC | PRN

## 2013-07-14 MED ORDER — FINASTERIDE 5 MG PO TABS
5.0000 mg | ORAL_TABLET | Freq: Every day | ORAL | Status: DC
  Administered 2013-07-15 – 2013-07-16 (×2): 5 mg via ORAL
  Filled 2013-07-14 (×2): qty 1

## 2013-07-14 MED ORDER — FENTANYL CITRATE 0.05 MG/ML IJ SOLN
INTRAMUSCULAR | Status: DC | PRN
  Administered 2013-07-14: 50 ug via INTRAVENOUS
  Administered 2013-07-14: 25 ug via INTRAVENOUS
  Administered 2013-07-14: 100 ug via INTRAVENOUS
  Administered 2013-07-14: 25 ug via INTRAVENOUS
  Administered 2013-07-14: 50 ug via INTRAVENOUS

## 2013-07-14 MED ORDER — HYDROCORTISONE 2.5 % RE CREA: TOPICAL_CREAM | Freq: Two times a day (BID) | RECTAL | Status: DC | PRN

## 2013-07-14 MED ORDER — HEMOSTATIC AGENTS (NO CHARGE) OPTIME
TOPICAL | Status: DC | PRN
  Administered 2013-07-14: 1 via TOPICAL

## 2013-07-14 MED ORDER — DOCUSATE SODIUM 100 MG PO CAPS
100.0000 mg | ORAL_CAPSULE | Freq: Every day | ORAL | Status: DC
  Administered 2013-07-15 – 2013-07-16 (×2): 100 mg via ORAL
  Filled 2013-07-14 (×2): qty 1

## 2013-07-14 MED ORDER — ACETAMINOPHEN 325 MG PO TABS: 325.0000 mg | ORAL_TABLET | ORAL | Status: DC | PRN

## 2013-07-14 MED ORDER — ENOXAPARIN SODIUM 30 MG/0.3ML ~~LOC~~ SOLN
30.0000 mg | SUBCUTANEOUS | Status: DC
  Administered 2013-07-15: 30 mg via SUBCUTANEOUS
  Filled 2013-07-14 (×2): qty 0.3

## 2013-07-14 MED ORDER — MUPIROCIN 2 % EX OINT: TOPICAL_OINTMENT | Freq: Two times a day (BID) | CUTANEOUS | Status: DC

## 2013-07-14 MED ORDER — BISACODYL 10 MG RE SUPP: 10.0000 mg | Freq: Every day | RECTAL | Status: DC | PRN

## 2013-07-14 MED ORDER — PHENYLEPHRINE HCL 10 MG/ML IJ SOLN
INTRAMUSCULAR | Status: DC | PRN
  Administered 2013-07-14: 40 ug via INTRAVENOUS
  Administered 2013-07-14 (×3): 80 ug via INTRAVENOUS
  Administered 2013-07-14 (×3): 40 ug via INTRAVENOUS

## 2013-07-14 MED ORDER — MUPIROCIN 2 % EX OINT
TOPICAL_OINTMENT | Freq: Two times a day (BID) | CUTANEOUS | Status: DC
  Filled 2013-07-14: qty 22

## 2013-07-14 MED ORDER — HYDROMORPHONE HCL PF 1 MG/ML IJ SOLN: 0.2500 mg | INTRAMUSCULAR | Status: DC | PRN

## 2013-07-14 MED ORDER — HEPARIN SODIUM (PORCINE) 1000 UNIT/ML IJ SOLN
INTRAMUSCULAR | Status: DC | PRN
  Administered 2013-07-14: 9000 [IU] via INTRAVENOUS
  Administered 2013-07-14: 1000 [IU] via INTRAVENOUS
  Administered 2013-07-14: 2000 [IU] via INTRAVENOUS

## 2013-07-14 MED ORDER — MAGNESIUM SULFATE 40 MG/ML IJ SOLN: 2.0000 g | Freq: Once | INTRAMUSCULAR | Status: DC | PRN

## 2013-07-14 MED ORDER — MUPIROCIN 2 % EX OINT
TOPICAL_OINTMENT | CUTANEOUS | Status: AC
  Administered 2013-07-14: 1 via NASAL
  Filled 2013-07-14: qty 22

## 2013-07-14 MED ORDER — ASPIRIN 81 MG PO TABS: 81.0000 mg | ORAL_TABLET | Freq: Every day | ORAL | Status: DC

## 2013-07-14 MED ORDER — LACTATED RINGERS IV SOLN
INTRAVENOUS | Status: DC
  Administered 2013-07-14: 11:00:00 via INTRAVENOUS

## 2013-07-14 MED ORDER — SODIUM CHLORIDE 0.9 % IV SOLN
10.0000 mg | INTRAVENOUS | Status: DC | PRN
  Administered 2013-07-14: 30 ug/min via INTRAVENOUS

## 2013-07-14 MED ORDER — PANTOPRAZOLE SODIUM 40 MG PO TBEC
40.0000 mg | DELAYED_RELEASE_TABLET | Freq: Every day | ORAL | Status: DC
  Administered 2013-07-14 – 2013-07-16 (×3): 40 mg via ORAL
  Filled 2013-07-14 (×3): qty 1

## 2013-07-14 MED ORDER — MUPIROCIN 2 % EX OINT
TOPICAL_OINTMENT | Freq: Two times a day (BID) | CUTANEOUS | Status: DC
  Administered 2013-07-14: 1 via NASAL
  Administered 2013-07-14 – 2013-07-15 (×3): via NASAL
  Administered 2013-07-16: 1 via NASAL
  Filled 2013-07-14: qty 22

## 2013-07-14 MED ORDER — OXYCODONE HCL 5 MG PO TABS: 5.0000 mg | ORAL_TABLET | Freq: Four times a day (QID) | ORAL | Status: DC | PRN

## 2013-07-14 MED ORDER — ONDANSETRON HCL 4 MG/2ML IJ SOLN
INTRAMUSCULAR | Status: DC | PRN
  Administered 2013-07-14 (×2): 4 mg via INTRAVENOUS

## 2013-07-14 MED ORDER — AMLODIPINE BESYLATE 10 MG PO TABS
10.0000 mg | ORAL_TABLET | Freq: Every day | ORAL | Status: DC
  Administered 2013-07-15 – 2013-07-16 (×2): 10 mg via ORAL
  Filled 2013-07-14 (×2): qty 1

## 2013-07-14 MED ORDER — GLYCOPYRROLATE 0.2 MG/ML IJ SOLN
INTRAMUSCULAR | Status: DC | PRN
  Administered 2013-07-14: 0.6 mg via INTRAVENOUS
  Administered 2013-07-14: 0.2 mg via INTRAVENOUS

## 2013-07-14 MED ORDER — ALUM & MAG HYDROXIDE-SIMETH 200-200-20 MG/5ML PO SUSP: 15.0000 mL | ORAL | Status: DC | PRN

## 2013-07-14 MED ORDER — OXYCODONE HCL 5 MG PO TABS: 5.0000 mg | ORAL_TABLET | Freq: Once | ORAL | Status: DC | PRN

## 2013-07-14 MED ORDER — SENNOSIDES-DOCUSATE SODIUM 8.6-50 MG PO TABS
1.0000 | ORAL_TABLET | Freq: Every evening | ORAL | Status: DC | PRN
  Filled 2013-07-14: qty 1

## 2013-07-14 MED ORDER — HYDROCHLOROTHIAZIDE 25 MG PO TABS
25.0000 mg | ORAL_TABLET | Freq: Every day | ORAL | Status: DC
  Administered 2013-07-15 – 2013-07-16 (×2): 25 mg via ORAL
  Filled 2013-07-14 (×3): qty 1

## 2013-07-14 MED ORDER — ARTIFICIAL TEARS OP OINT
TOPICAL_OINTMENT | OPHTHALMIC | Status: DC | PRN
  Administered 2013-07-14: 1 via OPHTHALMIC

## 2013-07-14 SURGICAL SUPPLY — 49 items
ADH SKN CLS APL DERMABOND .7 (GAUZE/BANDAGES/DRESSINGS) ×1
CANISTER SUCTION 2500CC (MISCELLANEOUS) ×2 IMPLANT
CATH ROBINSON RED A/P 18FR (CATHETERS) ×2 IMPLANT
CATH SUCT 10FR WHISTLE TIP (CATHETERS) ×2 IMPLANT
CLIP TI MEDIUM 24 (CLIP) ×2 IMPLANT
CLIP TI WIDE RED SMALL 24 (CLIP) ×2 IMPLANT
COVER SURGICAL LIGHT HANDLE (MISCELLANEOUS) ×2 IMPLANT
CRADLE DONUT ADULT HEAD (MISCELLANEOUS) ×2 IMPLANT
DERMABOND ADVANCED (GAUZE/BANDAGES/DRESSINGS) ×1
DERMABOND ADVANCED .7 DNX12 (GAUZE/BANDAGES/DRESSINGS) ×1 IMPLANT
DRAIN CHANNEL 15F RND FF W/TCR (WOUND CARE) ×1 IMPLANT
DRAPE WARM FLUID 44X44 (DRAPE) ×2 IMPLANT
ELECT REM PT RETURN 9FT ADLT (ELECTROSURGICAL) ×2
ELECTRODE REM PT RTRN 9FT ADLT (ELECTROSURGICAL) ×1 IMPLANT
EVACUATOR SILICONE 100CC (DRAIN) ×1 IMPLANT
FELT TEFLON 4 X1 (Mesh General) ×1 IMPLANT
GLOVE BIO SURGEON STRL SZ 6.5 (GLOVE) ×2 IMPLANT
GLOVE BIO SURGEON STRL SZ7.5 (GLOVE) ×2 IMPLANT
GLOVE BIOGEL PI IND STRL 6.5 (GLOVE) IMPLANT
GLOVE BIOGEL PI IND STRL 7.5 (GLOVE) ×1 IMPLANT
GLOVE BIOGEL PI INDICATOR 6.5 (GLOVE) ×1
GLOVE BIOGEL PI INDICATOR 7.5 (GLOVE) ×1
GLOVE SURG SS PI 6.5 STRL IVOR (GLOVE) ×1 IMPLANT
GLOVE SURG SS PI 7.5 STRL IVOR (GLOVE) ×2 IMPLANT
GOWN PREVENTION PLUS XXLARGE (GOWN DISPOSABLE) ×2 IMPLANT
GOWN STRL NON-REIN LRG LVL3 (GOWN DISPOSABLE) ×7 IMPLANT
HEMOSTAT SNOW SURGICEL 2X4 (HEMOSTASIS) ×1 IMPLANT
INSERT FOGARTY SM (MISCELLANEOUS) IMPLANT
KIT BASIN OR (CUSTOM PROCEDURE TRAY) ×2 IMPLANT
KIT ROOM TURNOVER OR (KITS) ×2 IMPLANT
NS IRRIG 1000ML POUR BTL (IV SOLUTION) ×4 IMPLANT
PACK CAROTID (CUSTOM PROCEDURE TRAY) ×2 IMPLANT
PAD ARMBOARD 7.5X6 YLW CONV (MISCELLANEOUS) ×4 IMPLANT
PATCH VASCULAR VASCU GUARD 1X6 (Vascular Products) ×1 IMPLANT
SHUNT CAROTID BYPASS 10 (VASCULAR PRODUCTS) IMPLANT
SHUNT CAROTID BYPASS 12FRX15.5 (VASCULAR PRODUCTS) IMPLANT
SPONGE INTESTINAL PEANUT (DISPOSABLE) ×2 IMPLANT
SUT ETHILON 3 0 PS 1 (SUTURE) ×1 IMPLANT
SUT PROLENE 6 0 BV (SUTURE) ×9 IMPLANT
SUT PROLENE 7 0 BV 1 (SUTURE) IMPLANT
SUT PROLENE 7 0 BV1 MDA (SUTURE) ×1 IMPLANT
SUT SILK 3 0 TIES 17X18 (SUTURE)
SUT SILK 3-0 18XBRD TIE BLK (SUTURE) IMPLANT
SUT VIC AB 3-0 SH 27 (SUTURE) ×4
SUT VIC AB 3-0 SH 27X BRD (SUTURE) ×2 IMPLANT
SUT VICRYL 4-0 PS2 18IN ABS (SUTURE) ×2 IMPLANT
TOWEL OR 17X24 6PK STRL BLUE (TOWEL DISPOSABLE) ×2 IMPLANT
TOWEL OR 17X26 10 PK STRL BLUE (TOWEL DISPOSABLE) ×2 IMPLANT
WATER STERILE IRR 1000ML POUR (IV SOLUTION) ×2 IMPLANT

## 2013-07-14 NOTE — Progress Notes (Signed)
Pt seen in the PACU.  He is following commands and is able to wiggle fingers on both hands as well as grip with both hands and wiggles toes on both feet.  His tongue is midline.  Pt is doing well post operatively.  Doreatha Massed 07/14/2013 3:50 PM

## 2013-07-14 NOTE — OR Nursing (Signed)
Carotid duplex completed/  ICA open. Doreatha Massed PA at bedside,  She communicated same to physician. Speech is more fluid and cleare, orientation improved though thinks is in ICU

## 2013-07-14 NOTE — Op Note (Signed)
[facility] [place]  Patient name: [name] MRN: [id number] DOB: 1941-08-10 Sex: male  07/14/2013 Pre-operative Diagnosis: Symptomatic   left carotid stenosis Post-operative diagnosis:  Same Surgeon:  Jorge Ny Assistants:  S. Rhyne Procedure:    left carotid Endarterectomy with bovine pericardial patch angioplasty Anesthesia:  General Blood Loss:  See anesthesia record Specimens:  Carotid Plaque to pathology  Findings:  90 %stenosis; Thrombus:  none  Indications:  This is a 72 year old gentleman who suffered a stroke approximately 6 weeks ago. During his workup, he was found to have high grade, bilateral carotid stenosis. He has a previous stroke in 2010. He has residual right arm and leg weakness. He is able to function at a normal level. He comes in today for endarterectomy.  Procedure:  The patient was identified in the holding area and taken to [facility] OR ROOM 11  The patient was then placed supine on the table.   General endotrachial anesthesia was administered.  The patient was prepped and draped in the usual sterile fashion.  A time out was called and antibiotics were administered.  The incision was made along the anterior border of the left sternocleidomastoid muscle.  Cautery was used to dissect through the subcutaneous tissue.  The platysma muscle was divided with cautery.  The internal jugular vein was exposed along its anterior medial border.  The common facial vein was exposed and then divided between 2-0 silk ties and metal clips.  The common carotid artery was then circumferentially exposed and encircled with an umbilical tape.  The vagus nerve was identified and protected.  Next sharp dissection was used to expose the external carotid artery and the superior thyroid artery.  The were encircled with a blue vessel loop and a 2-0 silk tie respectively.  Finally, the internal carotid was carefully dissected free.  An umbilical tape was placed around  the internal carotid artery distal to the diseased segment.  The hypoglossal nerve was visualized throughout and protected.  The patient was given systemic heparinization.  A bovine carotid patch was selected and prepared on the back table.  A 10 french shunt was also prepared.  After blood pressure readings were appropriate and the heparin had been given time to circulate, the internal carotid artery was occluded with a baby Gregory clamp.  The external and common carotid arteries were then occluded with vascular clamps and the 2-0 tie tightened on the superior thyroid artery.  A #11 blade was used to make an arteriotomy in the common carotid artery.  This was extended with Potts scissors along the anterior and lateral border of the common and internal carotid artery.  Approximately 90% stenosis was identified.  There was no thrombus identified.  The 10 french shunt was then placed.  A kleiner kuntz elevator was used to perform endarterectomy.  An eversion endarterectomy was performed in the external carotid artery.  A good distal endpoint was obtained in the internal carotid artery.  The specimen was removed and sent to pathology.  Heparinized saline was used to irrigate the endarterectomized field.  All potential embolic debris was removed.  Bovine pericardial patch angioplasty was then performed using a running 6-0 Prolene. Just prior to completion of the repair, the shunt was removed. The common internal and external carotid arteries were all appropriately flushed. The artery was again irrigated with heparin saline.  The anastomosis was then secured. The clamp was first released on the external carotid artery followed by the common carotid  artery approximately 30 seconds later, bloodflow was reestablish through the internal carotid artery.  Next, a hand-held  Doppler was used to evaluate the signals in the common, external, and internal  carotid arteries, all of which had appropriate signals. I then  administered  50 mg protamine. The wound was then irrigated. The patient had oozing from raw surface areas. There was no surgical bleeding. I elected to place a 5 [place] Blake drain which was brought out through a separate stab incision and sutured into place with 2-0 nylon. After hemostasis was achieved, the carotid sheath was reapproximated with 3-0 Vicryl. The  platysma muscle was reapproximated with running 3-0 Vicryl. The skin  was closed with 4-0 Vicryl. Dermabond was placed on the skin. The  patient was then successfully extubated. His neurologic exam was  similar to his preprocedural exam. The patient was then taken to recovery room  in stable condition. There were no complications.     Disposition:  To PACU in stable condition.  Relevant Operative Details:  The patient had a normal location of his bifurcation. The hypoglossal nerve had to be mobilized but was fully protected. A 10 French shunt was placed. The patient had a ruptured plaque within the proximal internal carotid artery, creating approximately a 90% stenosis. I used a 10 [place] shunt for the procedure. Endarterectomy was performed with bovine pericardial patch angioplasty.  Juleen China, [name] [facility] [place] Office: [phone] Pager:  [phone]

## 2013-07-14 NOTE — Transfer of Care (Signed)
Immediate Anesthesia Transfer of Care Note  Patient: [name]  Procedure(s) Performed: Procedure(s): ENDARTERECTOMY CAROTID-LEFT (Left) PATCH ANGIOPLASTY using Vascu-Guard Vascular Patch (Left)  Patient Location: PACU  Anesthesia Type:General  Level of Consciousness: awake, alert , oriented and sedated  Airway & Oxygen Therapy: Patient Spontanous Breathing and Patient connected to nasal cannula oxygen  Post-op Assessment: Report given to PACU RN, Post -op Vital signs reviewed and stable and Patient moving all extremities  Post vital signs: Reviewed and stable  Complications: No apparent anesthesia complications

## 2013-07-14 NOTE — Progress Notes (Signed)
Pt admitted to 3300 from PACU. Oriented to unit and room. Safety discussed and call bell with in reach. VSS. Pt has jp x1 to left neck. Pt has garbled speech but MD at bedside and aware.  Elijah Birk, RN

## 2013-07-14 NOTE — Progress Notes (Signed)
Pharmacy Consult for Vancomycin for 1 dose post-op: Mr. Cartlidge is a 72 yo M s/p L CEA.  He was given vancomycin 1000 mg preop at 1230pm.  His wt is 97.9 kg and his creat cl is ~ 55 ml/min.  Plan: vancomycin 1000 mg IV x 1 dose tomorrow at 12 noon. Herby Abraham, Pharm.D. [phone] 07/14/2013 4:09 PM

## 2013-07-14 NOTE — Anesthesia Preprocedure Evaluation (Addendum)
Anesthesia Evaluation  Patient identified by MRN, date of birth, ID band Patient awake    Reviewed: Allergy & Precautions, H&P , NPO status , Patient's Chart, lab work & pertinent test results  Airway Mallampati: I  Neck ROM: Full    Dental   Pulmonary neg pulmonary ROS,  breath sounds clear to auscultation        Cardiovascular hypertension, + Peripheral Vascular Disease Rhythm:Regular Rate:Normal     Neuro/Psych Anxiety Depression CVA, Residual Symptoms    GI/Hepatic negative GI ROS, Neg liver ROS,   Endo/Other  negative endocrine ROS  Renal/GU      Musculoskeletal   Abdominal   Peds  Hematology negative hematology ROS (+)   Anesthesia Other Findings   Reproductive/Obstetrics                          Anesthesia Physical Anesthesia Plan  ASA: III  Anesthesia Plan: General   Post-op Pain Management:    Induction: Intravenous  Airway Management Planned: Oral ETT  Additional Equipment: Arterial line  Intra-op Plan:   Post-operative Plan: Extubation in OR  Informed Consent: I have reviewed the patients History and Physical, chart, labs and discussed the procedure including the risks, benefits and alternatives for the proposed anesthesia with the patient or authorized representative who has indicated his/her understanding and acceptance.     Plan Discussed with:   Anesthesia Plan Comments:         Anesthesia Quick Evaluation

## 2013-07-14 NOTE — Progress Notes (Signed)
VASCULAR LAB PRELIMINARY  PRELIMINARY  PRELIMINARY  PRELIMINARY   Left carotid duplex completed.    Preliminary report:  Patent ICA with no flow detected in the ECA.  Results given to [facility], PA  Casar, Teryn Boerema, RVT 07/14/2013, 5:43 PM

## 2013-07-14 NOTE — H&P (View-Only) (Signed)
[facility] [place]   Patient name: [name] MRN: [id number] DOB: 04/17/1941 Sex: male   Referred by: Dr. Shah  Reason for referral:  Chief Complaint  Patient presents with  . Carotid    new pt, carotid stenosis - Dr. Shah    HISTORY OF PRESENT ILLNESS: This is a very pleasant gentleman who was referred for evaluation of carotid stenosis in the setting of a stroke. The patient has a history of a left brain stroke in 2010. At that time he presented with right-sided weakness and slurred speech. He was given TPA. His symptoms resolved. He had a CT angiogram which showed 30% right carotid stenosis and 50% left carotid stenosis. In early August of this year the patient had a similar episode with similar symptoms. An MRI showed a small left brain infarct without mass effect or hemorrhage. Duplex ultrasound showed greater than 70% bilateral carotid stenosis He was started on Plavix. He is continuing to get rehabilitation and physical therapy. His strength has improved. He still has residual numbness in his right arm.  The patient is medically managed for his hypertension. He is now taking Plavix as his antiplatelet agent. He stopped his aspirin. The patient is a nonsmoker.  Past Medical History  Diagnosis Date  . Carotid artery occlusion   . Cervicalgia   . Depressive disorder, not elsewhere classified   . BPH (benign prostatic hypertrophy)   . Pain in limb   . Unspecified transient cerebral ischemia   . Varicose veins   . Inguinal hernia without mention of obstruction or gangrene, unilateral or unspecified, (not specified as recurrent)   . Unspecified hemorrhoids without mention of complication   . Cerebrovascular disease, unspecified   . Hypertension   . Other psoriasis   . Backache, unspecified   . Stroke     Past Surgical History  Procedure Laterality Date  . Spine surgery  1992    Dr. Plomaritis  . Back surgery    . Hernia repair      History    Social History  . Marital Status: Widowed    Spouse Name: N/A    Number of Children: N/A  . Years of Education: N/A   Occupational History  . Not on file.   Social History Main Topics  . Smoking status: Never Smoker   . Smokeless tobacco: Never Used  . Alcohol Use: No  . Drug Use: No  . Sexual Activity: Not on file   Other Topics Concern  . Not on file   Social History Narrative  . No narrative on file    Family History  Problem Relation Age of Onset  . Heart attack Father   . Cancer Daughter     Allergies as of 07/04/2013 - Review Complete 07/04/2013  Allergen Reaction Noted  . Ace inhibitors  06/28/2013  . Cardura [doxazosin mesylate] Other (See Comments) 06/28/2013  . Penicillins Rash 06/28/2013    Current Outpatient Prescriptions on File Prior to Visit  Medication Sig Dispense Refill  . amLODipine (NORVASC) 10 MG tablet Take 10 mg by mouth daily.      . clopidogrel (PLAVIX) 75 MG tablet Take 75 mg by mouth daily.      . finasteride (PROSCAR) 5 MG tablet Take 5 mg by mouth daily.      . hydrochlorothiazide (HYDRODIURIL) 25 MG tablet Take 25 mg by mouth daily.      . hydrocortisone (ANUSOL-HC) 2.5 % rectal cream Place rectally 2 (two) times   daily.      . traMADol (ULTRAM) 50 MG tablet Take 50 mg by mouth as needed.       . aspirin 81 MG tablet Take 81 mg by mouth daily.      . betamethasone dipropionate (DIPROLENE) 0.05 % cream Apply topically 2 (two) times daily.      . fexofenadine (ALLEGRA) 180 MG tablet Take 180 mg by mouth daily.      . metoprolol succinate (TOPROL-XL) 25 MG 24 hr tablet Take 25 mg by mouth daily.      . sertraline (ZOLOFT) 25 MG tablet Take 25 mg by mouth daily.       No current facility-administered medications on file prior to visit.     REVIEW OF SYSTEMS: Positive for pain in his legs with walking as well as swelling. Positive for right arm and leg weakness, slurred speech, dizziness. Positive for blood in his stool.  All other  systems are negative  PHYSICAL EXAMINATION: General: The patient appears their stated age.  Vital signs are BP 139/81  Pulse 75  Ht 5' 8" (1.727 m)  Wt 218 lb (98.884 kg)  BMI 33.15 kg/m2  SpO2 96% HEENT:  No gross abnormalities Pulmonary: Respirations are non-labored Abdomen: Soft and non-tender  Musculoskeletal: There are no major deformities.   Neurologic: Right upper extremity and lower extremity weakness Skin: There are no ulcer or rashes noted. Psychiatric: The patient has normal affect. Cardiovascular: There is a regular rate and rhythm without significant murmur appreciated. Palpable pedal pulses. No carotid bruit  Diagnostic Studies: Carotid duplex was repeated here today. This shows greater than 80% bilateral carotid stenosis. He has normal anatomy with a high bifurcation  Medication Changes: I started the patient on Lipitor 10 mg today. I also restarted his baby aspirin  Assessment:  Symptomatic left carotid stenosis Plan: The patient is approximately one month out from a left brain stroke. Imaging studies have revealed bilateral carotid stenosis greater than 80%. For stroke prevention, I have recommended left carotid endarterectomy and possible right carotid endarterectomy at a later date. We discussed the risks and benefits of surgery which include but are not limited to the risk of stroke, the risk of nerve injury, bleeding, cardiopulmonary complications. He is not on a statin, and therefore I started him on Lipitor. In addition I told him to continue taking his Plavix, but restart his baby aspirin. I have scheduled his left carotid endarterectomy for Thursday, September 18. The patient is going out of town this weekend and once to have his operation following his trip to [place]     V. Wells Andrian Sabala IV, [name] [facility] Office: [phone] Pager:  [phone]   

## 2013-07-14 NOTE — OR Nursing (Signed)
wrinlkes brow/looks puzzled, intermittently follows comands yet is seen to have purposeful movement x 4 extremities by myself and vasc PA, Sam Rhyne. DUPLEX doppler pending

## 2013-07-14 NOTE — Anesthesia Postprocedure Evaluation (Signed)
  Anesthesia Post-op Note  Patient: [name]  Procedure(s) Performed: Procedure(s): ENDARTERECTOMY CAROTID-LEFT (Left) PATCH ANGIOPLASTY using Vascu-Guard Vascular Patch (Left)  Patient Location: PACU  Anesthesia Type:General  Level of Consciousness: awake, oriented and sedated  Airway and Oxygen Therapy: Patient Spontanous Breathing and Patient connected to nasal cannula oxygen  Post-op Pain: none  Post-op Assessment: Post-op Vital signs reviewed, Patient's Cardiovascular Status Stable, Respiratory Function Stable, Patent Airway and Pain level controlled  Post-op Vital Signs: stable  Complications: No apparent anesthesia complications

## 2013-07-14 NOTE — Preoperative (Signed)
Beta Blockers   Reason not to administer Beta Blockers:Not Applicable 

## 2013-07-14 NOTE — Interval H&P Note (Signed)
History and Physical Interval Note:  07/14/2013 10:58 AM  Milon Score  has presented today for surgery, with the diagnosis of Left Internal Carotid Artery Stenosis   The various methods of treatment have been discussed with the patient and family. After consideration of risks, benefits and other options for treatment, the patient has consented to  Procedure(s): ENDARTERECTOMY CAROTID-LEFT (Left) as a surgical intervention .  The patient's history has been reviewed, patient examined, no change in status, stable for surgery.  I have reviewed the patient's chart and labs.  Questions were answered to the patient's satisfaction.     Kauri Garson IV, V. WELLS

## 2013-07-14 NOTE — Progress Notes (Signed)
S/p left CEA.  Pt had pre-operative stroke x2 with sx'x of right sided weakness and slurred speech.  He has been somewhat slow to wake up and intermittently follows commands.  Initially post-op he did squeeze his right hand, although it was a little weak.  He was also weak on the left side.  I ordered a stat carotid duplex which showed a patent CCA and ICA.  He has progressively improved.  I sa him again at 545.  This time, his right arm strength was significantly improved and very similar to baseline.  He did have slurred speech.  His mouth was very dry, and it appeared to also be improving from when he was in the PACU.  I feel that he is having symptoms of reperfusion that are not unexpected given his history of stroke.  I do not feel that he has had a new stroke at this time.  I discussed this with his family.  He will be closely monitored overnight.  I do not feel that angiography is warrented at this time as the patient is improving.  Durene Cal

## 2013-07-15 ENCOUNTER — Telehealth: Payer: Self-pay | Admitting: Surgery

## 2013-07-15 LAB — BASIC METABOLIC PANEL
CO2: 29 mEq/L (ref 19–32)
Calcium: 8.2 mg/dL — ABNORMAL LOW (ref 8.4–10.5)
Creatinine, Ser: 1.06 mg/dL (ref 0.50–1.35)
GFR calc Af Amer: 78 mL/min — ABNORMAL LOW (ref >90)
GFR calc non Af Amer: 67 mL/min — ABNORMAL LOW (ref >90)
Sodium: 140 mEq/L (ref 135–145)

## 2013-07-15 LAB — CBC
MCH: 31.9 pg (ref 26.0–34.0)
MCHC: 35.8 g/dL (ref 30.0–36.0)
MCV: 88.9 fL (ref 78.0–100.0)
Platelets: 241 10*3/uL (ref 150–400)
RBC: 3.61 MIL/uL — ABNORMAL LOW (ref 4.22–5.81)
RDW: 12.9 % (ref 11.5–15.5)

## 2013-07-15 MED ORDER — POTASSIUM CHLORIDE CRYS ER 20 MEQ PO TBCR
20.0000 meq | EXTENDED_RELEASE_TABLET | Freq: Two times a day (BID) | ORAL | Status: AC
  Administered 2013-07-15 (×2): 20 meq via ORAL
  Filled 2013-07-15 (×2): qty 1

## 2013-07-15 MED ORDER — INFLUENZA VAC SPLIT QUAD 0.5 ML IM SUSP
0.5000 mL | INTRAMUSCULAR | Status: AC
  Administered 2013-07-16: 0.5 mL via INTRAMUSCULAR
  Filled 2013-07-15: qty 0.5

## 2013-07-15 MED ORDER — PNEUMOCOCCAL VAC POLYVALENT 25 MCG/0.5ML IJ INJ
0.5000 mL | INJECTION | INTRAMUSCULAR | Status: AC
  Administered 2013-07-16: 0.5 mL via INTRAMUSCULAR
  Filled 2013-07-15: qty 0.5

## 2013-07-15 NOTE — Progress Notes (Signed)
Utilization review completed.  P.J. Alazar Cherian,RN,BSN Case Manager [phone]  

## 2013-07-15 NOTE — Progress Notes (Addendum)
[facility] Progress Note  07/15/2013 7:19 AM 1 Day Post-Op  Subjective:  Pt states that he cannot get any water, but has water at his bedside.  States he just got this.  RN states he has had water all night.  Afebrile VSS 91% RA  Filed Vitals:   07/15/13 0700  BP:   Pulse:   Temp: 98.5 F (36.9 C)  Resp:      Physical Exam: Neuro:  Right hand grip is improved from yesterday; pt's speech is normal this am and much improved from post op.  Moving both lower extremities.  Drinking water while I am in the room and drinking without difficulty.  Tongue is midline. Incision:  C/d/i  CBC    Component Value Date/Time   WBC 16.9* 07/15/2013 0419   RBC 3.61* 07/15/2013 0419   HGB 11.5* 07/15/2013 0419   HCT 32.1* 07/15/2013 0419   PLT 241 07/15/2013 0419   MCV 88.9 07/15/2013 0419   MCH 31.9 07/15/2013 0419   MCHC 35.8 07/15/2013 0419   RDW 12.9 07/15/2013 0419    BMET    Component Value Date/Time   NA 140 07/15/2013 0419   K 2.9* 07/15/2013 0419   CL 102 07/15/2013 0419   CO2 29 07/15/2013 0419   GLUCOSE 134* 07/15/2013 0419   BUN 17 07/15/2013 0419   CREATININE 1.06 07/15/2013 0419   CALCIUM 8.2* 07/15/2013 0419   GFRNONAA 67* 07/15/2013 0419   GFRAA 78* 07/15/2013 0419     Intake/Output Summary (Last 24 hours) at 07/15/13 0719 Last data filed at 07/15/13 0433  Gross per 24 hour  Intake   3185 ml  Output   1374 ml  Net   1811 ml   JP output:  25cc and minimal serosanguinous fluid in bulb    Assessment/Plan:  This is a 72 y.o. male who is s/p left CEA 1 Day Post-Op  -pt is doing well this am.  RN states that he has been confused intermittently over night having periods where he is in tact.  When he wakes, he is confused. -pt right hand grip is much improved from post op and speech has improved -pt has not ambulated-mobilize pt -hypokalemia-supplement (has been hypokalemic the past 3 blood draws over a period of time) will give K-dur 20 mEq po bid x 2  doses -WBC is improved this am, but still above normal -acute surgical blood loss anemia-pt tolerating -possible transfer to [facility]-will d/w Dr. Vista Lawman, PA-C [facility] [phone]

## 2013-07-15 NOTE — Telephone Encounter (Addendum)
sched f/u for 9 days, 2 and 3 weeks unavailable, next available was 4 weeks  Message copied by Jena Gauss on Fri Jul 15, 2013  9:55 AM ------      Message from: Sharee Pimple      Created: Fri Jul 15, 2013  8:16 AM      Regarding: schedule                   ----- Message -----         From: Dara Lords, PA-C         Sent: 07/14/2013   3:13 PM           To: Sharee Pimple, CMA            S/p left CEA 07/16/13.  F/u with Dr. Myra Gianotti in 2 weeks.            Thanks,      Lelon Mast ------

## 2013-07-16 LAB — BASIC METABOLIC PANEL
BUN: 13 mg/dL (ref 6–23)
Chloride: 86 mEq/L — ABNORMAL LOW (ref 96–112)
Creatinine, Ser: 1.22 mg/dL (ref 0.50–1.35)
GFR calc Af Amer: 66 mL/min — ABNORMAL LOW (ref >90)
GFR calc non Af Amer: 57 mL/min — ABNORMAL LOW (ref >90)
Glucose, Bld: 150 mg/dL — ABNORMAL HIGH (ref 70–99)
Potassium: 2.9 mEq/L — ABNORMAL LOW (ref 3.5–5.1)

## 2013-07-16 MED ORDER — OXYCODONE-ACETAMINOPHEN 5-325 MG PO TABS: 1.0000 | ORAL_TABLET | ORAL | Status: DC | PRN

## 2013-07-16 NOTE — Progress Notes (Signed)
Pt feels he is back to baseline with exception of mild right hand weakness  Filed Vitals:   07/16/13 0031 07/16/13 0300 07/16/13 0343 07/16/13 0800  BP: 119/64  137/69 125/68  Pulse:      Temp:  99.4 F (37.4 C)    TempSrc:  Oral    Resp: 16  15   Height:      Weight:      SpO2: 92%  93% 91%    Left neck incision healing JP drain minimal, no hematoma Neuro UE 4/5 right UE left 5/5 lower extremities 5/5 bilat  A: Neuro status continues to improve P: d/c drain D/c home  Fabienne Bruns, MD [facility] [place] [facility] Office: [phone] Pager: [phone]

## 2013-07-16 NOTE — Progress Notes (Signed)
Patient has been discharged home with daughter. Have removed IV. Patient has received his flu and pneumonia shots. Have reviewed all discharge instructions with the patient and his daughter. All questions and concerns have been addressed. Instructed the daughter that the patient cannot drive yet.

## 2013-07-17 ENCOUNTER — Encounter (HOSPITAL_COMMUNITY): Payer: Self-pay | Admitting: Surgery

## 2013-07-18 ENCOUNTER — Telehealth: Payer: Self-pay

## 2013-07-18 DIAGNOSIS — G8918 Other acute postprocedural pain: Secondary | ICD-10-CM

## 2013-07-18 MED ORDER — TRAMADOL HCL 50 MG PO TABS: 50.0000 mg | ORAL_TABLET | Freq: Four times a day (QID) | ORAL | Status: DC | PRN

## 2013-07-18 NOTE — Telephone Encounter (Signed)
rec'd phone call from pt's daughter.  Reported that the Percocet he was given upon discharge from the hospital, "makes him loopy."  Is requesting an alternative to this for pain.  States pt. Has had success with using Tramadol.  States pt. Has some swelling in the left neck incisional area.  No report of redness or warmth.  States he is eating and swallowing ok, and denies any difficulty breathing.  Advised to monitor swelling and to report any signs of increased redness or warmth or drainage from the incisional area.  Also advised to monitor for pulsatile mass.  Daughter verb. Understanding, and will continue to monitor for worsening of symptoms.  Discussed w/ Dr. Myra Gianotti.  Rec'd. verbal okay to order Tramadol per standing orders.  Rx. will be sent to the pharmacy.

## 2013-07-21 NOTE — Discharge Summary (Signed)
[facility] Discharge Summary   Patient ID:  [name] MRN: [id number] DOB/AGE: [birth year] 74 y.o.  Admit date: 07/14/2013 Discharge date: 07/15/2013 Date of Surgery: 07/14/2013 Surgeon: Surgeon(s): Nada Libman, MD  Admission Diagnosis: Left Internal Carotid Artery Stenosis   Discharge Diagnoses:  Left Internal Carotid Artery Stenosis   Secondary Diagnoses: Past Medical History  Diagnosis Date  . Carotid artery occlusion   . Cervicalgia   . Depressive disorder, not elsewhere classified   . BPH (benign prostatic hypertrophy)   . Pain in limb   . Unspecified transient cerebral ischemia   . Varicose veins   . Inguinal hernia without mention of obstruction or gangrene, unilateral or unspecified, (not specified as recurrent)   . Unspecified hemorrhoids without mention of complication   . Cerebrovascular disease, unspecified   . Hypertension   . Other psoriasis   . Backache, unspecified   . Stroke   . Anxiety   . Enlarged prostate   . Arthritis     Procedure(s): ENDARTERECTOMY CAROTID-LEFT PATCH ANGIOPLASTY using Vascu-Guard Vascular Patch  Discharged Condition: good  HPI:  [name] is a 72 y.o. male  is a very pleasant gentleman who was referred for evaluation of carotid stenosis in the setting of a stroke. The patient has a history of a left brain stroke in 2010. At that time he presented with right-sided weakness and slurred speech. He was given TPA. His symptoms resolved. He had a CT angiogram which showed 30% right carotid stenosis and 50% left carotid stenosis. In early August of this year the patient had a similar episode with similar symptoms. An MRI showed a small left brain infarct without mass effect or hemorrhage. Duplex ultrasound showed greater than 70% bilateral carotid stenosis He was started on Plavix. He is continuing to get rehabilitation and physical therapy. His strength has improved. He still has residual numbness in his  right arm.  The patient is medically managed for his hypertension. He is now taking Plavix as his antiplatelet agent. He stopped his aspirin. The patient is a nonsmoker. Imaging studies have revealed bilateral carotid stenosis greater than 80%. For stroke prevention, Dr. Myra Gianotti recommended left carotid endarterectomy and possible right carotid endarterectomy at a later date. We discussed the risks and benefits of surgery which include but are not limited to the risk of stroke, the risk of nerve injury, bleeding, cardiopulmonary complications. He is not on a statin, and therefore I started him on Lipitor. He was to continue taking his Plavix, but restart his baby aspirin. He is scheduled for left carotid endarterectomy for Thursday, September 18. The patient is going out of town this weekend and once to have his operation following his trip to [facility] Course:  [name] is a 72 y.o. male is S/PENDARTERECTOMY CAROTID-LEFT PATCH ANGIOPLASTY using Vascu-Guard Vascular Patch Extubated: POD # 0 Physical exam: Pt is A&O x 3 Gait is normal Speech is fluent left Neck Wound is healing Negative tongue deviation Negative facial droop Pt has good and equal strength in all extremities except 4+/5 strength in RUE Post-op wounds healing well Pt. Ambulating, voiding and taking PO diet without difficulty. Pt pain controlled with PO pain meds. Labs as below Complications:none  Consults:     Significant Diagnostic Studies: CBC Lab Results  Component Value Date   WBC 16.9* 07/15/2013   HGB 11.5* 07/15/2013   HCT 32.1* 07/15/2013   MCV 88.9 07/15/2013   PLT 241 07/15/2013  BMET    Component Value Date/Time   NA 126* 07/16/2013 0530   K 2.9* 07/16/2013 0530   CL 86* 07/16/2013 0530   CO2 27 07/16/2013 0530   GLUCOSE 150* 07/16/2013 0530   BUN 13 07/16/2013 0530   CREATININE 1.22 07/16/2013 0530   CALCIUM 9.0 07/16/2013 0530   GFRNONAA 57* 07/16/2013 0530   GFRAA 66* 07/16/2013 0530    COAG Lab Results  Component Value Date   INR 0.98 07/12/2013   INR 1.0 06/16/2009     Disposition:  Discharge to :Home Discharge Orders   Future Appointments Provider Department Dept Phone   07/25/2013 10:15 AM Nada Libman, MD [facility] [phone]   Future Orders Complete By Expires   Call MD for:  redness, tenderness, or signs of infection (pain, swelling, bleeding, redness, odor or green/yellow discharge around incision site)  As directed    Call MD for:  redness, tenderness, or signs of infection (pain, swelling, bleeding, redness, odor or green/yellow discharge around incision site)  As directed    Call MD for:  severe or increased pain, loss or decreased feeling  in affected limb(s)  As directed    Call MD for:  severe or increased pain, loss or decreased feeling  in affected limb(s)  As directed    Call MD for:  temperature >100.5  As directed    Call MD for:  temperature >100.5  As directed    CAROTID Sugery: Call MD for difficulty swallowing or speaking; weakness in arms or legs that is a new symtom; severe headache.  If you have increased swelling in the neck and/or  are having difficulty breathing, CALL 911  As directed    Discharge wound care:  As directed    Comments:     Shower daily with soap and water starting 07/16/13   Driving Restrictions  As directed    Comments:     No driving for 2 weeks   Driving Restrictions  As directed    Comments:     No driving for 2 weeks   Increase activity slowly  As directed    Comments:     Walk with assistance use walker or cane as needed   Lifting restrictions  As directed    Comments:     No lifting for 2 weeks   Lifting restrictions  As directed    Comments:     No lifting for 2 weeks   Nursing communication  As directed    Scheduling Instructions:     Please give paper Rx to patient at discharge.  It is located on the paper chart.   Resume previous diet  As directed    Resume  previous diet  As directed        Medication List    STOP taking these medications       traMADol 50 MG tablet  Commonly known as:  ULTRAM      TAKE these medications       amLODipine 10 MG tablet  Commonly known as:  NORVASC  Take 10 mg by mouth daily.     aspirin 81 MG tablet  Take 81 mg by mouth daily.     atorvastatin 10 MG tablet  Commonly known as:  LIPITOR  Take 1 tablet (10 mg total) by mouth daily.     betamethasone dipropionate 0.05 % cream  Commonly known as:  DIPROLENE  Apply topically 2 (two) times daily.  clopidogrel 75 MG tablet  Commonly known as:  PLAVIX  Take 75 mg by mouth daily.     fexofenadine 180 MG tablet  Commonly known as:  ALLEGRA  Take 180 mg by mouth daily.     finasteride 5 MG tablet  Commonly known as:  PROSCAR  Take 5 mg by mouth daily.     hydrochlorothiazide 25 MG tablet  Commonly known as:  HYDRODIURIL  Take 25 mg by mouth daily.     hydrocortisone 2.5 % rectal cream  Commonly known as:  ANUSOL-HC  Place rectally 2 (two) times daily.     metoprolol succinate 25 MG 24 hr tablet  Commonly known as:  TOPROL-XL  Take 25 mg by mouth daily as needed.     mupirocin ointment 2 %  Commonly known as:  BACTROBAN  Using a q-tip apply to each nasal area three times daily.     oxyCODONE 5 MG immediate release tablet  Commonly known as:  ROXICODONE  Take 1 tablet (5 mg total) by mouth every 6 (six) hours as needed for pain.     oxyCODONE-acetaminophen 5-325 MG per tablet  Commonly known as:  PERCOCET/ROXICET  Take 1-2 tablets by mouth every 4 (four) hours as needed.       Verbal and written Discharge instructions given to the patient. Wound care per Discharge AVS     Follow-up Information   Follow up with Myra Gianotti IV, Lala Lund, MD In 2 weeks. (Office will call you to arrange your appt (sent))    Specialty:  Vascular Surgery   Contact information:   [address] [place] [address] [phone]        Signed: Marlowe Shores 07/21/2013, 9:18 AM  --- For VQI Registry use --- Instructions: Press F2 to tab through selections.  Delete question if not applicable.   Modified Rankin score at D/C (0-6): Rankin Score=1  IV medication needed for:  1. Hypertension: No 2. Hypotension: No  Post-op Complications: No  1. Post-op CVA or TIA: No  2. CN injury: No  3. Myocardial infarction: No  4.  CHF: No  5.  Dysrhythmia (new): No  6. Wound infection: No  7. Reperfusion symptoms: No  8. Return to OR: No  Discharge medications: Statin use:  Yes ASA use:  Yes Beta blocker use:  Yes ACE-Inhibitor use:  Yes P2Y12 Antagonist use: [ ]  None, [x ] Plavix, [ ]  Plasugrel, [ ]  Ticlopinine, [ ]  Ticagrelor, [ ]  Other, [ ]  No for medical reason, [ ]  Non-compliant, [ ]  Not-indicated Anti-coagulant use:  [x ] None, [ ]  Warfarin, [ ]  Rivaroxaban, [ ]  Dabigatran, [ ]  Other, [ ]  No for medical reason, [ ]  Non-compliant, [ ]  Not-indicated

## 2013-07-22 ENCOUNTER — Encounter: Payer: Self-pay | Admitting: Surgery

## 2013-07-24 NOTE — Discharge Summary (Signed)
I agree with the above  Wells Alwyn Cordner 

## 2013-07-24 NOTE — Progress Notes (Signed)
I agree with the above  [name] 

## 2013-07-25 ENCOUNTER — Encounter: Payer: Self-pay | Admitting: Surgery

## 2013-07-25 ENCOUNTER — Ambulatory Visit (INDEPENDENT_AMBULATORY_CARE_PROVIDER_SITE_OTHER): Payer: Medicare Other | Admitting: Surgery

## 2013-07-25 DIAGNOSIS — Z48812 Encounter for surgical aftercare following surgery on the circulatory system: Secondary | ICD-10-CM

## 2013-07-25 DIAGNOSIS — R4702 Dysphasia: Secondary | ICD-10-CM

## 2013-07-25 DIAGNOSIS — I6529 Occlusion and stenosis of unspecified carotid artery: Secondary | ICD-10-CM

## 2013-07-25 DIAGNOSIS — R4789 Other speech disturbances: Secondary | ICD-10-CM

## 2013-07-25 NOTE — Progress Notes (Signed)
The patient is back today for followup. On 07/14/2013, he underwent left carotid endarterectomy with patch angioplasty. This was done for symptomatic left carotid stenosis. The patient had had a preoperative stroke which has left him with residual right arm and leg weakness as well as some cognitive issues. Intraoperative findings included 90% stenosis. The patient was somewhat slow to wake up. I felt that he had reverted back to his stroke like to state. This required him to spend an extra night in the hospital, however he did improve to the point where he was able to be discharged. He has been having some memory issues which are a little more pronounced postoperatively.  On examination, his incision is healing nicely. His grip strength is good bilaterally. He appears to have adequate memory and cognitive function with my questioning.  The patient does have right carotid stenosis that needs to be addressed, however I do not feel that this should be done in the immediate future because of his recovery from his preoperative stroke. I am setting him up with speech therapy to help with his memory and cognitive function. He also has physical therapy which is being performed as an outpatient. He will followup with me in 6 weeks with a repeat carotid ultrasound. At that time I will reevaluate his cognitive function and memory. Also, I will have to make a decision regarding his right carotid artery.

## 2013-07-26 ENCOUNTER — Telehealth: Payer: Self-pay | Admitting: Surgery

## 2013-07-26 NOTE — Telephone Encounter (Signed)
informed patient of appt. for speech therapy on 08-03-13 10 am at [facility], scheduled  with [facility] [phone]

## 2013-07-26 NOTE — Addendum Note (Signed)
Addended by: Sharee Pimple on: 07/26/2013 10:03 AM   Modules accepted: Orders

## 2013-08-03 ENCOUNTER — Ambulatory Visit: Payer: Medicare Other | Attending: Surgery | Admitting: Speech Pathology

## 2013-08-09 ENCOUNTER — Other Ambulatory Visit: Payer: Self-pay | Admitting: Surgery

## 2013-08-24 ENCOUNTER — Ambulatory Visit: Admit: 2013-08-24 | Payer: Self-pay | Admitting: Internal Medicine

## 2013-08-24 SURGERY — COLONOSCOPY
Anesthesia: Moderate Sedation

## 2013-08-26 ENCOUNTER — Telehealth: Payer: Self-pay

## 2013-08-26 NOTE — Telephone Encounter (Signed)
Pt. reported symptoms of dizziness and numbness in right upper and lower extremities that occurred this AM.  Stated he almost fell down, because he stood up before he noticed the numbness in his right leg.  Denies any dizziness at this time.  Stated that his right arm and leg continue to feel different.  Denies any speech problems. Denies any confusion.  States the symptoms of numbness in extremities have occurred about 3 times since surgery.  Stated he did not report this to Dr. Myra Gianotti on 9/29, because the episodes happened after his appt.  Advised pt. to call his PCP at this time to report the above symptoms.  Stated he would call his PCP.

## 2013-09-02 ENCOUNTER — Encounter: Payer: Self-pay | Admitting: Surgery

## 2013-09-05 ENCOUNTER — Ambulatory Visit: Payer: Medicare Other | Admitting: Surgery

## 2013-09-05 ENCOUNTER — Encounter: Payer: Self-pay | Admitting: Surgery

## 2013-09-05 ENCOUNTER — Ambulatory Visit (HOSPITAL_COMMUNITY)
Admission: RE | Admit: 2013-09-05 | Discharge: 2013-09-05 | Disposition: A | Discharge status: Home / Self Care | Payer: Medicare Other | Source: Ambulatory Visit | Attending: Surgery | Admitting: Surgery

## 2013-09-05 ENCOUNTER — Ambulatory Visit (INDEPENDENT_AMBULATORY_CARE_PROVIDER_SITE_OTHER): Payer: Self-pay | Admitting: Surgery

## 2013-09-05 DIAGNOSIS — Z48812 Encounter for surgical aftercare following surgery on the circulatory system: Secondary | ICD-10-CM

## 2013-09-05 DIAGNOSIS — I6529 Occlusion and stenosis of unspecified carotid artery: Secondary | ICD-10-CM

## 2013-09-05 NOTE — Progress Notes (Signed)
[facility] [place]   Patient name: [name] MRN: [id number] DOB: Feb 04, 1941 Sex: male     Chief Complaint  Patient presents with  . Carotid    f/u  s/p left carotid endar  07/14/13  lab study prior    HISTORY OF PRESENT ILLNESS: The patient is back today for followup.  The patient initially had a stroke in August of 2014.  During his workup, he was found to have high-grade bilateral carotid stenosis.  His symptoms included right arm and leg weakness as well as a decline in his cognition.  On 07/14/2013 he underwent left carotid endarterectomy.  He was somewhat slow to wake up after his operation.  He is ultimately able to discharge home 2 days after his endarterectomy.  When I saw him at his first postoperative visit I did not feel like he was quite back to his baseline and elected to delay his right-sided endarterectomy. Since I had last seen him he feels like his memory is back to normal.  He is doing regular activities.  He denies having symptoms in his left side.  Past Medical History  Diagnosis Date  . Carotid artery occlusion   . Cervicalgia   . Depressive disorder, not elsewhere classified   . BPH (benign prostatic hypertrophy)   . Pain in limb   . Unspecified transient cerebral ischemia   . Varicose veins   . Inguinal hernia without mention of obstruction or gangrene, unilateral or unspecified, (not specified as recurrent)   . Unspecified hemorrhoids without mention of complication   . Cerebrovascular disease, unspecified   . Hypertension   . Other psoriasis   . Backache, unspecified   . Stroke   . Anxiety   . Enlarged prostate   . Arthritis     Past Surgical History  Procedure Laterality Date  . Spine surgery  1992    Dr. Arletha Grippe  . Back surgery    . Hernia repair    . Vasectomy    . Colonoscopy    . Endarterectomy Left 07/14/2013    Procedure: ENDARTERECTOMY CAROTID-LEFT;  Surgeon: Nada Libman, MD;  Location: [facility] OR;  Service:  Vascular;  Laterality: Left;  . Patch angioplasty Left 07/14/2013    Procedure: PATCH ANGIOPLASTY using Vascu-Guard Vascular Patch;  Surgeon: Nada Libman, MD;  Location[facility] OR;  Service: Vascular;  Laterality: Left;    History   Social History  . Marital Status: Widowed    Spouse Name: N/A    Number of Children: N/A  . Years of Education: N/A   Occupational History  . Not on file.   Social History Main Topics  . Smoking status: Never Smoker   . Smokeless tobacco: Never Used  . Alcohol Use: No  . Drug Use: No  . Sexual Activity: Not on file   Other Topics Concern  . Not on file   Social History Narrative  . No narrative on file    Family History  Problem Relation Age of Onset  . Heart attack Father   . Cancer Daughter     Allergies as of 09/05/2013 - Review Complete 09/05/2013  Allergen Reaction Noted  . Ace inhibitors  06/28/2013  . Cardura [doxazosin mesylate] Other (See Comments) 06/28/2013  . Penicillins Rash 06/28/2013    Current Outpatient Prescriptions on File Prior to Visit  Medication Sig Dispense Refill  . amLODipine (NORVASC) 10 MG tablet Take 10 mg by mouth daily.      Marland Kitchen  aspirin 81 MG tablet Take 81 mg by mouth daily.      Marland Kitchen atorvastatin (LIPITOR) 10 MG tablet Take 1 tablet (10 mg total) by mouth daily.  30 tablet  0  . betamethasone dipropionate (DIPROLENE) 0.05 % cream Apply topically 2 (two) times daily.      . clopidogrel (PLAVIX) 75 MG tablet Take 75 mg by mouth daily.      . fexofenadine (ALLEGRA) 180 MG tablet Take 180 mg by mouth daily.      . finasteride (PROSCAR) 5 MG tablet Take 5 mg by mouth daily.      . hydrochlorothiazide (HYDRODIURIL) 25 MG tablet Take 25 mg by mouth daily.      . hydrocortisone (ANUSOL-HC) 2.5 % rectal cream Place rectally 2 (two) times daily.      . metoprolol succinate (TOPROL-XL) 25 MG 24 hr tablet Take 25 mg by mouth daily as needed.       . mupirocin ointment (BACTROBAN) 2 % Using a q-tip apply to each nasal  area three times daily.  22 g  0  . oxyCODONE (ROXICODONE) 5 MG immediate release tablet Take 1 tablet (5 mg total) by mouth every 6 (six) hours as needed for pain.  30 tablet  0  . oxyCODONE-acetaminophen (PERCOCET/ROXICET) 5-325 MG per tablet Take 1-2 tablets by mouth every 4 (four) hours as needed.  30 tablet  0  . traMADol (ULTRAM) 50 MG tablet Take 1 tablet (50 mg total) by mouth every 6 (six) hours as needed for pain.  20 tablet  0   No current facility-administered medications on file prior to visit.     REVIEW OF SYSTEMS: Cardiovascular: No chest pain, chest pressure, palpitations, orthopnea, or dyspnea on exertion. No claudication or rest pain,  No history of DVT or phlebitis. Pulmonary: No productive cough, asthma or wheezing. Neurologic: No weakness, paresthesias, aphasia, or amaurosis. No dizziness. Hematologic: No bleeding problems or clotting disorders. Musculoskeletal: No joint pain or joint swelling. Gastrointestinal: No blood in stool or hematemesis Genitourinary: No dysuria or hematuria. Psychiatric:: No history of major depression. Integumentary: No rashes or ulcers. Constitutional: No fever or chills.  PHYSICAL EXAMINATION:   Vital signs are BP 130/86  Pulse 69  Resp 16  Ht 5\' 8"  (1.727 m)  Wt 208 lb (94.348 kg)  BMI 31.63 kg/m2 General: The patient appears their stated age. HEENT:  No gross abnormalities Pulmonary:  Non labored breathing Musculoskeletal: There are no major deformities. Neurologic: No focal weakness or paresthesias are detected, Skin: There are no ulcer or rashes noted. Psychiatric: The patient has normal affect. Cardiovascular: There is a regular rate and rhythm without significant murmur appreciated.   Diagnostic Studies Carotid ultrasound today shows greater than 80% right carotid stenosis.  The anatomy is normal past the bifurcation which on ultrasound was described as high  Assessment: Asymptomatic right carotid stenosis Plan: I  feel the patient has shown dramatic improvement since I last saw him.  He is functioning about at his new baseline.  He has a high-grade right carotid stenosis.  I have recommended right carotid endarterectomy for stroke prevention.  We again went over the risks and benefits of the operation which he fully understands.  He does not show any signs of persistent, and therefore I elected not to send him for ENT evaluation.  He has an old CT angiogram for several years ago which shows that his right carotid bifurcation is easily accessible.  I have scheduled his operation for Thursday, December  18.  He will continue with his aspirin and Plavix  V. Charlena Cross, [name] [facility] [place] Office: [phone] Pager:  [phone]

## 2013-09-07 ENCOUNTER — Encounter: Payer: Self-pay | Admitting: *Deleted

## 2013-09-07 ENCOUNTER — Other Ambulatory Visit: Payer: Self-pay | Admitting: *Deleted

## 2013-10-04 ENCOUNTER — Encounter (HOSPITAL_COMMUNITY)
Admission: RE | Admit: 2013-10-04 | Discharge: 2013-10-04 | Disposition: A | Discharge status: Home / Self Care | Payer: Medicare Other | Source: Ambulatory Visit | Attending: Surgery | Admitting: Surgery

## 2013-10-04 ENCOUNTER — Encounter (HOSPITAL_COMMUNITY): Payer: Self-pay

## 2013-10-04 ENCOUNTER — Encounter (HOSPITAL_COMMUNITY): Payer: Self-pay | Admitting: Pharmacy Technician

## 2013-10-04 DIAGNOSIS — Z01812 Encounter for preprocedural laboratory examination: Secondary | ICD-10-CM | POA: Insufficient documentation

## 2013-10-04 DIAGNOSIS — Z01818 Encounter for other preprocedural examination: Secondary | ICD-10-CM | POA: Insufficient documentation

## 2013-10-04 LAB — CBC
HCT: 43.5 % (ref 39.0–52.0)
Hemoglobin: 15.2 g/dL (ref 13.0–17.0)
MCH: 31.1 pg (ref 26.0–34.0)
MCHC: 34.9 g/dL (ref 30.0–36.0)
Platelets: 332 10*3/uL (ref 150–400)
RBC: 4.89 MIL/uL (ref 4.22–5.81)
WBC: 16.2 10*3/uL — ABNORMAL HIGH (ref 4.0–10.5)

## 2013-10-04 LAB — URINALYSIS, ROUTINE W REFLEX MICROSCOPIC
Glucose, UA: NEGATIVE mg/dL
Ketones, ur: NEGATIVE mg/dL
Leukocytes, UA: NEGATIVE
Protein, ur: NEGATIVE mg/dL
Specific Gravity, Urine: 1.019 (ref 1.005–1.030)
Urobilinogen, UA: 0.2 mg/dL (ref 0.0–1.0)

## 2013-10-04 LAB — COMPREHENSIVE METABOLIC PANEL
ALT: 12 U/L (ref 0–53)
AST: 20 U/L (ref 0–37)
Albumin: 4.2 g/dL (ref 3.5–5.2)
Alkaline Phosphatase: 91 U/L (ref 39–117)
CO2: 28 mEq/L (ref 19–32)
Calcium: 9.4 mg/dL (ref 8.4–10.5)
GFR calc non Af Amer: 53 mL/min — ABNORMAL LOW (ref >90)
Glucose, Bld: 103 mg/dL — ABNORMAL HIGH (ref 70–99)
Potassium: 3 mEq/L — ABNORMAL LOW (ref 3.5–5.1)
Sodium: 136 mEq/L (ref 135–145)

## 2013-10-04 LAB — APTT: aPTT: 31 seconds (ref 24–37)

## 2013-10-04 LAB — TYPE AND SCREEN
ABO/RH(D): O POS
Antibody Screen: NEGATIVE

## 2013-10-04 NOTE — Pre-Procedure Instructions (Signed)
URIAH PHILIPSON  10/04/2013   Your procedure is scheduled on:  Thursday December 18 th at 1018 AM  Report to [facility] Short Stay Main Entrance "A"at 0730 AM.  Call this number if you have problems the morning of surgery: [phone]   Remember:   Do not eat food or drink liquids after midnight.   Take these medicines the morning of surgery with A SIP OF WATER: amlodipine (NORVASC), fexofenadine ALLEGRA), metoprolol (TOPROL_XL), and pain medication if needed   Do not wear jewelry, make-up or nail polish.  Do not wear lotions, powders, or perfumes. You may wear deodorant.  Do not shave 48 hours prior to surgery. Men may shave face and neck.  Do not bring valuables to the hospital.  [facility] is not responsible for any belongings or valuables.               Contacts, dentures or bridgework may not be worn into surgery.  Leave suitcase in the car. After surgery it may be brought to your room.  For patients admitted to the hospital, discharge time is determined by your treatment team.               Patients discharged the day of surgery will not be allowed to drive home.    Special Instructions: Shower using CHG 2 nights before surgery and the night before surgery.  If you shower the day of surgery use CHG.  Use special wash - you have one bottle of CHG for all showers.  You should use approximately 1/3 of the bottle for each shower.   Please read over the following fact sheets that you were given: Pain Booklet, Coughing and Deep Breathing, Blood Transfusion Information, MRSA Information and Surgical Site Infection Prevention

## 2013-10-04 NOTE — Progress Notes (Signed)
Called Dr Estanislado Spire office for instructions for Plavix, patient is to stop Plavix on 10/07/13 and continue taking Aspirin.

## 2013-10-05 NOTE — Progress Notes (Signed)
Anesthesia Chart Review: Patient is a 72 year old male scheduled for right CEA on 10/13/13 by Dr. Myra Gianotti.   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 He is s/p left CEA on 10/13/13.  PCP is Dr. Kirstie Peri.   EKG on 07/12/13 showed NSR, LAD, reverse r wave progression in V2-V3. He denied history of MI, CHF, known CAD, prior stress or cath.   Echo on 06/23/13 (ordered by Dr. Sherryll Burger @ [facility]; scanned under Media tab, Correspondence 07/14/13): Left ventricle normal in size and shape, EF 60-65%, abnormal diastolic function with E to A reversal, question of grade 1 diastolic dysfunction. Grossly normal size RV with suspected prominent moderator band. Mildly thickened MV with trace MR. Mild TR, RVSP 20-30 mm Hg. Mild to moderate AV sclerosis. Aortic root is mildly sclerotic but normal in size. Interatrial septum was not well seen. Consider repeat echo in 2-3 years or as clinically indicated, consider cardiac MRI to exclude significant pathology involving RV moderator band, consider TEE with saline contrast (Dr. Johny Shears).   Carotid duplex on 07/26/13 showed 80-99% right ICA stenosis, patent left CEA site without stenosis.    CXR on 07/12/13 showed no acute cardiopulmonary abnormality seen.   Preoperative labs noted. WBC 16.2, Cr 1.29, K 3.0, glucose 103. UA WNL.   Patient has mild leukocytosis which was present prior to his previous left CEA (WBC 16.2 on 07/12/13).  UA WNL.  No reported sick symptoms during his PAT visit. If no acute changes then I would anticipate that he could proceed from an anesthesia standpoint.  Velna Ochs [facility] Short Stay Center/Anesthesiology Phone [phone] 10/05/2013 5:16 PM

## 2013-10-12 MED ORDER — SODIUM CHLORIDE 0.9 % IV SOLN
1500.0000 mg | INTRAVENOUS | Status: AC
  Administered 2013-10-13: 1500 mg via INTRAVENOUS
  Filled 2013-10-12: qty 1500

## 2013-10-13 ENCOUNTER — Encounter (HOSPITAL_COMMUNITY)
Admission: RE | Disposition: A | Discharge status: Home / Self Care | Payer: Self-pay | Source: Ambulatory Visit | Attending: Surgery

## 2013-10-13 ENCOUNTER — Inpatient Hospital Stay (HOSPITAL_COMMUNITY): Payer: Medicare Other | Admitting: Certified Registered Nurse Anesthetist

## 2013-10-13 ENCOUNTER — Encounter (HOSPITAL_COMMUNITY): Payer: Medicare Other | Admitting: Vascular Surgery

## 2013-10-13 ENCOUNTER — Encounter (HOSPITAL_COMMUNITY): Payer: Self-pay | Admitting: *Deleted

## 2013-10-13 ENCOUNTER — Inpatient Hospital Stay (HOSPITAL_COMMUNITY)
Admission: RE | Admit: 2013-10-13 | Discharge: 2013-10-14 | DRG: 039 | Disposition: A | Discharge status: Home / Self Care | Payer: Medicare Other | Source: Ambulatory Visit | Attending: Surgery | Admitting: Surgery

## 2013-10-13 DIAGNOSIS — Z8673 Personal history of transient ischemic attack (TIA), and cerebral infarction without residual deficits: Secondary | ICD-10-CM

## 2013-10-13 DIAGNOSIS — I10 Essential (primary) hypertension: Secondary | ICD-10-CM | POA: Diagnosis present

## 2013-10-13 DIAGNOSIS — I6529 Occlusion and stenosis of unspecified carotid artery: Principal | ICD-10-CM | POA: Diagnosis present

## 2013-10-13 DIAGNOSIS — N400 Enlarged prostate without lower urinary tract symptoms: Secondary | ICD-10-CM | POA: Diagnosis present

## 2013-10-13 HISTORY — PX: ENDARTERECTOMY: SHX5162

## 2013-10-13 HISTORY — PX: CAROTID ENDARTERECTOMY: SUR193

## 2013-10-13 SURGERY — ENDARTERECTOMY, CAROTID
Anesthesia: General | Site: Neck | Laterality: Right

## 2013-10-13 MED ORDER — ONDANSETRON HCL 4 MG/2ML IJ SOLN: 4.0000 mg | Freq: Four times a day (QID) | INTRAMUSCULAR | Status: DC | PRN

## 2013-10-13 MED ORDER — FINASTERIDE 5 MG PO TABS
5.0000 mg | ORAL_TABLET | Freq: Every day | ORAL | Status: DC
  Administered 2013-10-14: 5 mg via ORAL
  Filled 2013-10-13: qty 1

## 2013-10-13 MED ORDER — ROCURONIUM BROMIDE 100 MG/10ML IV SOLN
INTRAVENOUS | Status: DC | PRN
  Administered 2013-10-13: 50 mg via INTRAVENOUS
  Administered 2013-10-13: 20 mg via INTRAVENOUS

## 2013-10-13 MED ORDER — LACTATED RINGERS IV SOLN
INTRAVENOUS | Status: DC
  Administered 2013-10-13 (×2): via INTRAVENOUS

## 2013-10-13 MED ORDER — ASPIRIN 81 MG PO TABS: 81.0000 mg | ORAL_TABLET | Freq: Every day | ORAL | Status: DC

## 2013-10-13 MED ORDER — PROMETHAZINE HCL 25 MG/ML IJ SOLN: 6.2500 mg | INTRAMUSCULAR | Status: DC | PRN

## 2013-10-13 MED ORDER — OXYCODONE HCL 5 MG PO TABS: 5.0000 mg | ORAL_TABLET | Freq: Once | ORAL | Status: DC | PRN

## 2013-10-13 MED ORDER — HEPARIN SODIUM (PORCINE) 1000 UNIT/ML IJ SOLN
INTRAMUSCULAR | Status: DC | PRN
  Administered 2013-10-13: 7000 [IU] via INTRAVENOUS
  Administered 2013-10-13 (×2): 1000 [IU] via INTRAVENOUS

## 2013-10-13 MED ORDER — DOPAMINE-DEXTROSE 3.2-5 MG/ML-% IV SOLN: 3.0000 ug/kg/min | INTRAVENOUS | Status: DC

## 2013-10-13 MED ORDER — DOCUSATE SODIUM 100 MG PO CAPS
100.0000 mg | ORAL_CAPSULE | Freq: Every day | ORAL | Status: DC
  Administered 2013-10-14: 100 mg via ORAL
  Filled 2013-10-13: qty 1

## 2013-10-13 MED ORDER — ATORVASTATIN CALCIUM 10 MG PO TABS
10.0000 mg | ORAL_TABLET | Freq: Every day | ORAL | Status: DC
  Filled 2013-10-13: qty 1

## 2013-10-13 MED ORDER — MORPHINE SULFATE 2 MG/ML IJ SOLN: 2.0000 mg | INTRAMUSCULAR | Status: DC | PRN

## 2013-10-13 MED ORDER — ONDANSETRON HCL 4 MG/2ML IJ SOLN
INTRAMUSCULAR | Status: DC | PRN
  Administered 2013-10-13: 4 mg via INTRAVENOUS

## 2013-10-13 MED ORDER — HEMOSTATIC AGENTS (NO CHARGE) OPTIME
TOPICAL | Status: DC | PRN
  Administered 2013-10-13: 1 via TOPICAL

## 2013-10-13 MED ORDER — GLYCOPYRROLATE 0.2 MG/ML IJ SOLN
INTRAMUSCULAR | Status: DC | PRN
  Administered 2013-10-13: 0.2 mg via INTRAVENOUS
  Administered 2013-10-13: .8 mg via INTRAVENOUS

## 2013-10-13 MED ORDER — EPHEDRINE SULFATE 50 MG/ML IJ SOLN
INTRAMUSCULAR | Status: DC | PRN
  Administered 2013-10-13: 10 mg via INTRAVENOUS
  Administered 2013-10-13 (×2): 5 mg via INTRAVENOUS

## 2013-10-13 MED ORDER — SODIUM CHLORIDE 0.9 % IV SOLN: 500.0000 mL | Freq: Once | INTRAVENOUS | Status: AC | PRN

## 2013-10-13 MED ORDER — PANTOPRAZOLE SODIUM 40 MG PO TBEC
40.0000 mg | DELAYED_RELEASE_TABLET | Freq: Every day | ORAL | Status: DC
  Administered 2013-10-13 – 2013-10-14 (×2): 40 mg via ORAL
  Filled 2013-10-13 (×3): qty 1

## 2013-10-13 MED ORDER — OXYCODONE HCL 5 MG/5ML PO SOLN: 5.0000 mg | Freq: Once | ORAL | Status: DC | PRN

## 2013-10-13 MED ORDER — ALUM & MAG HYDROXIDE-SIMETH 200-200-20 MG/5ML PO SUSP: 15.0000 mL | ORAL | Status: DC | PRN

## 2013-10-13 MED ORDER — HYDROMORPHONE HCL PF 1 MG/ML IJ SOLN: 0.2500 mg | INTRAMUSCULAR | Status: DC | PRN

## 2013-10-13 MED ORDER — PHENOL 1.4 % MT LIQD: 1.0000 | OROMUCOSAL | Status: DC | PRN

## 2013-10-13 MED ORDER — PROTAMINE SULFATE 10 MG/ML IV SOLN
INTRAVENOUS | Status: DC | PRN
  Administered 2013-10-13: 20 mg via INTRAVENOUS
  Administered 2013-10-13: 10 mg via INTRAVENOUS
  Administered 2013-10-13: 20 mg via INTRAVENOUS

## 2013-10-13 MED ORDER — PHENYLEPHRINE HCL 10 MG/ML IJ SOLN
INTRAMUSCULAR | Status: DC | PRN
  Administered 2013-10-13: 80 ug via INTRAVENOUS

## 2013-10-13 MED ORDER — ASPIRIN EC 81 MG PO TBEC
81.0000 mg | DELAYED_RELEASE_TABLET | Freq: Every day | ORAL | Status: DC
  Administered 2013-10-14: 81 mg via ORAL
  Filled 2013-10-13: qty 1

## 2013-10-13 MED ORDER — AMLODIPINE BESYLATE 10 MG PO TABS
10.0000 mg | ORAL_TABLET | Freq: Every day | ORAL | Status: DC
  Administered 2013-10-14: 10 mg via ORAL
  Filled 2013-10-13: qty 1

## 2013-10-13 MED ORDER — HYDRALAZINE HCL 20 MG/ML IJ SOLN: 10.0000 mg | INTRAMUSCULAR | Status: DC | PRN

## 2013-10-13 MED ORDER — MAGNESIUM SULFATE 40 MG/ML IJ SOLN
2.0000 g | Freq: Once | INTRAMUSCULAR | Status: AC | PRN
  Filled 2013-10-13: qty 50

## 2013-10-13 MED ORDER — PHENYLEPHRINE HCL 10 MG/ML IJ SOLN
10.0000 mg | INTRAVENOUS | Status: DC | PRN
  Administered 2013-10-13: 15 ug/min via INTRAVENOUS

## 2013-10-13 MED ORDER — LORATADINE 10 MG PO TABS
10.0000 mg | ORAL_TABLET | Freq: Every day | ORAL | Status: DC
  Administered 2013-10-14: 10 mg via ORAL
  Filled 2013-10-13: qty 1

## 2013-10-13 MED ORDER — LABETALOL HCL 5 MG/ML IV SOLN: 10.0000 mg | INTRAVENOUS | Status: DC | PRN

## 2013-10-13 MED ORDER — METOPROLOL TARTRATE 1 MG/ML IV SOLN: 2.0000 mg | INTRAVENOUS | Status: DC | PRN

## 2013-10-13 MED ORDER — SODIUM CHLORIDE 0.9 % IV SOLN
INTRAVENOUS | Status: DC
  Administered 2013-10-13: 16:00:00 via INTRAVENOUS

## 2013-10-13 MED ORDER — PROPOFOL 10 MG/ML IV BOLUS
INTRAVENOUS | Status: DC | PRN
  Administered 2013-10-13: 50 mg via INTRAVENOUS
  Administered 2013-10-13: 150 mg via INTRAVENOUS

## 2013-10-13 MED ORDER — LIDOCAINE HCL (PF) 1 % IJ SOLN
INTRAMUSCULAR | Status: AC
  Filled 2013-10-13: qty 30

## 2013-10-13 MED ORDER — POTASSIUM CHLORIDE CRYS ER 20 MEQ PO TBCR
20.0000 meq | EXTENDED_RELEASE_TABLET | Freq: Once | ORAL | Status: AC | PRN
Start: 2013-10-13 — End: 2013-10-13

## 2013-10-13 MED ORDER — TRAMADOL HCL 50 MG PO TABS: 50.0000 mg | ORAL_TABLET | Freq: Four times a day (QID) | ORAL | Status: DC | PRN

## 2013-10-13 MED ORDER — 0.9 % SODIUM CHLORIDE (POUR BTL) OPTIME
TOPICAL | Status: DC | PRN
  Administered 2013-10-13: 1000 mL

## 2013-10-13 MED ORDER — NEOSTIGMINE METHYLSULFATE 1 MG/ML IJ SOLN
INTRAMUSCULAR | Status: DC | PRN
  Administered 2013-10-13: 5 mg via INTRAVENOUS

## 2013-10-13 MED ORDER — LIDOCAINE HCL (CARDIAC) 20 MG/ML IV SOLN
INTRAVENOUS | Status: DC | PRN
  Administered 2013-10-13: 90 mg via INTRAVENOUS

## 2013-10-13 MED ORDER — DEXTRAN 40 IN SALINE 10-0.9 % IV SOLN
INTRAVENOUS | Status: AC
  Filled 2013-10-13: qty 500

## 2013-10-13 MED ORDER — ACETAMINOPHEN 650 MG RE SUPP: 325.0000 mg | RECTAL | Status: DC | PRN

## 2013-10-13 MED ORDER — SODIUM CHLORIDE 0.9 % IR SOLN
Status: DC | PRN
  Administered 2013-10-13: 10:00:00

## 2013-10-13 MED ORDER — DEXTRAN 40 IN SALINE 10-0.9 % IV SOLN
INTRAVENOUS | Status: DC | PRN
  Administered 2013-10-13: 500 mL

## 2013-10-13 MED ORDER — GUAIFENESIN-DM 100-10 MG/5ML PO SYRP: 15.0000 mL | ORAL_SOLUTION | ORAL | Status: DC | PRN

## 2013-10-13 MED ORDER — CLOPIDOGREL BISULFATE 75 MG PO TABS
75.0000 mg | ORAL_TABLET | Freq: Every day | ORAL | Status: DC
  Administered 2013-10-14: 75 mg via ORAL
  Filled 2013-10-13 (×2): qty 1

## 2013-10-13 MED ORDER — SODIUM CHLORIDE 0.9 % IV SOLN: INTRAVENOUS | Status: DC

## 2013-10-13 MED ORDER — OXYCODONE-ACETAMINOPHEN 5-325 MG PO TABS: 1.0000 | ORAL_TABLET | Freq: Four times a day (QID) | ORAL | Status: DC | PRN

## 2013-10-13 MED ORDER — MIDAZOLAM HCL 5 MG/5ML IJ SOLN
INTRAMUSCULAR | Status: DC | PRN
  Administered 2013-10-13: 2 mg via INTRAVENOUS

## 2013-10-13 MED ORDER — LACTATED RINGERS IV SOLN: INTRAVENOUS | Status: DC | PRN

## 2013-10-13 MED ORDER — FENTANYL CITRATE 0.05 MG/ML IJ SOLN
INTRAMUSCULAR | Status: DC | PRN
  Administered 2013-10-13: 100 ug via INTRAVENOUS
  Administered 2013-10-13 (×2): 50 ug via INTRAVENOUS

## 2013-10-13 MED ORDER — VANCOMYCIN HCL IN DEXTROSE 1-5 GM/200ML-% IV SOLN
1000.0000 mg | Freq: Two times a day (BID) | INTRAVENOUS | Status: DC
  Administered 2013-10-13: 1000 mg via INTRAVENOUS
  Filled 2013-10-13 (×2): qty 200

## 2013-10-13 MED ORDER — ACETAMINOPHEN 325 MG PO TABS: 325.0000 mg | ORAL_TABLET | ORAL | Status: DC | PRN

## 2013-10-13 MED ORDER — HYDROCHLOROTHIAZIDE 25 MG PO TABS
25.0000 mg | ORAL_TABLET | Freq: Every day | ORAL | Status: DC
  Administered 2013-10-14: 25 mg via ORAL
  Filled 2013-10-13: qty 1

## 2013-10-13 SURGICAL SUPPLY — 50 items
ADH SKN CLS APL DERMABOND .7 (GAUZE/BANDAGES/DRESSINGS) ×1
BAG DECANTER FOR FLEXI CONT (MISCELLANEOUS) ×1 IMPLANT
CANISTER SUCTION 2500CC (MISCELLANEOUS) ×2 IMPLANT
CATH ROBINSON RED A/P 18FR (CATHETERS) ×2 IMPLANT
CATH SUCT 10FR WHISTLE TIP (CATHETERS) ×2 IMPLANT
CLIP TI MEDIUM 6 (CLIP) ×2 IMPLANT
CLIP TI WIDE RED SMALL 6 (CLIP) ×2 IMPLANT
COVER SURGICAL LIGHT HANDLE (MISCELLANEOUS) ×2 IMPLANT
CRADLE DONUT ADULT HEAD (MISCELLANEOUS) ×2 IMPLANT
DERMABOND ADVANCED (GAUZE/BANDAGES/DRESSINGS) ×1
DERMABOND ADVANCED .7 DNX12 (GAUZE/BANDAGES/DRESSINGS) ×1 IMPLANT
DRAIN CHANNEL 15F RND FF W/TCR (WOUND CARE) IMPLANT
DRAPE PROXIMA HALF (DRAPES) ×1 IMPLANT
DRAPE WARM FLUID 44X44 (DRAPE) ×2 IMPLANT
ELECT REM PT RETURN 9FT ADLT (ELECTROSURGICAL) ×2
ELECTRODE REM PT RTRN 9FT ADLT (ELECTROSURGICAL) ×1 IMPLANT
EVACUATOR SILICONE 100CC (DRAIN) IMPLANT
GLOVE BIO SURGEON STRL SZ 6.5 (GLOVE) ×2 IMPLANT
GLOVE BIOGEL PI IND STRL 7.0 (GLOVE) IMPLANT
GLOVE BIOGEL PI IND STRL 7.5 (GLOVE) ×1 IMPLANT
GLOVE BIOGEL PI INDICATOR 7.0 (GLOVE) ×1
GLOVE BIOGEL PI INDICATOR 7.5 (GLOVE) ×1
GLOVE ECLIPSE 6.5 STRL STRAW (GLOVE) ×1 IMPLANT
GLOVE SURG SS PI 7.5 STRL IVOR (GLOVE) ×2 IMPLANT
GOWN PREVENTION PLUS XXLARGE (GOWN DISPOSABLE) ×2 IMPLANT
GOWN STRL NON-REIN LRG LVL3 (GOWN DISPOSABLE) ×6 IMPLANT
HEMOSTAT SNOW SURGICEL 2X4 (HEMOSTASIS) IMPLANT
INSERT FOGARTY SM (MISCELLANEOUS) IMPLANT
KIT BASIN OR (CUSTOM PROCEDURE TRAY) ×2 IMPLANT
KIT ROOM TURNOVER OR (KITS) ×2 IMPLANT
NS IRRIG 1000ML POUR BTL (IV SOLUTION) ×6 IMPLANT
PACK CAROTID (CUSTOM PROCEDURE TRAY) ×2 IMPLANT
PAD ARMBOARD 7.5X6 YLW CONV (MISCELLANEOUS) ×4 IMPLANT
PATCH VASCULAR VASCU GUARD 1X6 (Vascular Products) ×1 IMPLANT
SHUNT CAROTID BYPASS 10 (VASCULAR PRODUCTS) IMPLANT
SHUNT CAROTID BYPASS 12FRX15.5 (VASCULAR PRODUCTS) IMPLANT
SPONGE INTESTINAL PEANUT (DISPOSABLE) ×2 IMPLANT
SUT ETHILON 3 0 PS 1 (SUTURE) IMPLANT
SUT PROLENE 6 0 BV (SUTURE) ×4 IMPLANT
SUT PROLENE 7 0 BV 1 (SUTURE) IMPLANT
SUT PROLENE 7 0 BV1 MDA (SUTURE) ×1 IMPLANT
SUT SILK 3 0 TIES 17X18 (SUTURE)
SUT SILK 3-0 18XBRD TIE BLK (SUTURE) IMPLANT
SUT VIC AB 3-0 SH 27 (SUTURE) ×4
SUT VIC AB 3-0 SH 27X BRD (SUTURE) ×2 IMPLANT
SUT VICRYL 4-0 PS2 18IN ABS (SUTURE) ×2 IMPLANT
SYR 30ML LL (SYRINGE) ×1 IMPLANT
TOWEL OR 17X24 6PK STRL BLUE (TOWEL DISPOSABLE) ×2 IMPLANT
TOWEL OR 17X26 10 PK STRL BLUE (TOWEL DISPOSABLE) ×2 IMPLANT
WATER STERILE IRR 1000ML POUR (IV SOLUTION) ×2 IMPLANT

## 2013-10-13 NOTE — Transfer of Care (Signed)
Immediate Anesthesia Transfer of Care Note  Patient: [name]  Procedure(s) Performed: Procedure(s): RIGHT CAROTID ARTERY ENDARTERECTOMY WITH VASCU GUARD PATCH ANGIOPLASTY (Right)  Patient Location: PACU  Anesthesia Type:General  Level of Consciousness: awake and alert   Airway & Oxygen Therapy: Patient Spontanous Breathing and Patient connected to nasal cannula oxygen  Post-op Assessment: Report given to PACU RN, Post -op Vital signs reviewed and stable and Patient moving all extremities X 4  Post vital signs: Reviewed and stable  Complications: No apparent anesthesia complications

## 2013-10-13 NOTE — Anesthesia Preprocedure Evaluation (Addendum)
Anesthesia Evaluation  Patient identified by MRN, date of birth, ID band Patient awake    Reviewed: Allergy & Precautions, H&P , NPO status , Patient's Chart, lab work & pertinent test results  History of Anesthesia Complications Negative for: history of anesthetic complications  Airway Mallampati: II TM Distance: >3 FB Neck ROM: Full    Dental  (+) Teeth Intact, Caps and Dental Advisory Given   Pulmonary neg pulmonary ROS,    Pulmonary exam normal       Cardiovascular hypertension, Pt. on medications + Peripheral Vascular Disease  Echo on 06/23/13 (ordered by Dr. Sherryll Burger @ [facility]; scanned under Media tab, Correspondence 07/14/13): Left ventricle normal in size and shape, EF 60-65%, abnormal diastolic function with E to A reversal, question of grade 1 diastolic dysfunction. Grossly normal size RV with suspected prominent moderator band. Mildly thickened MV with trace MR. Mild TR, RVSP 20-30 mm Hg. Mild to moderate AV sclerosis. Aortic root is mildly sclerotic but normal in size. Interatrial septum was not well seen. Consider repeat echo in 2-3 years or as clinically indicated, consider cardiac MRI to exclude significant pathology involving RV moderator band, consider TEE with saline contrast (Dr. Johny Shears).       Neuro/Psych PSYCHIATRIC DISORDERS Anxiety Depression CVA, Residual Symptoms    GI/Hepatic negative GI ROS, Neg liver ROS,   Endo/Other  negative endocrine ROS  Renal/GU negative Renal ROS     Musculoskeletal   Abdominal   Peds  Hematology   Anesthesia Other Findings   Reproductive/Obstetrics                        Anesthesia Physical Anesthesia Plan  ASA: III  Anesthesia Plan: General   Post-op Pain Management:    Induction: Intravenous  Airway Management Planned: Oral ETT  Additional Equipment: Arterial line  Intra-op Plan:   Post-operative Plan: Extubation in OR  Informed  Consent: I have reviewed the patients History and Physical, chart, labs and discussed the procedure including the risks, benefits and alternatives for the proposed anesthesia with the patient or authorized representative who has indicated his/her understanding and acceptance.   Dental advisory given  Plan Discussed with: CRNA, Anesthesiologist and Surgeon  Anesthesia Plan Comments:        Anesthesia Quick Evaluation

## 2013-10-13 NOTE — Anesthesia Postprocedure Evaluation (Signed)
Anesthesia Post Note  Patient: [name]  Procedure(s) Performed: Procedure(s) (LRB): RIGHT CAROTID ARTERY ENDARTERECTOMY WITH VASCU GUARD PATCH ANGIOPLASTY (Right)  Anesthesia type: general  Patient location: PACU  Post pain: Pain level controlled  Post assessment: Patient's Cardiovascular Status Stable  Post vital signs: Reviewed and stable  Level of consciousness: sedated  Complications: No apparent anesthesia complications

## 2013-10-13 NOTE — Progress Notes (Signed)
Pt with no post void after sx, pt with no urge to void and no c/o, bladder scan done, 120cc noted to be in pt's bladder, nursing will cont to monitor

## 2013-10-13 NOTE — H&P (Signed)
[facility] [place]   Patient name: [name] MRN: [id number] DOB: 08/29/1941 Sex: male     Chief Complaint  Patient presents with  . Carotid    f/u  s/p left carotid endar  07/14/13  lab study prior    HISTORY OF PRESENT ILLNESS: The patient is back today for followup.  The patient initially had a stroke in August of 2014.  During his workup, he was found to have high-grade bilateral carotid stenosis.  His symptoms included right arm and leg weakness as well as a decline in his cognition.  On 07/14/2013 he underwent left carotid endarterectomy.  He was somewhat slow to wake up after his operation.  He is ultimately able to discharge home 2 days after his endarterectomy.  When I saw him at his first postoperative visit I did not feel like he was quite back to his baseline and elected to delay his right-sided endarterectomy. Since I had last seen him he feels like his memory is back to normal.  He is doing regular activities.  He denies having symptoms in his left side.  Past Medical History  Diagnosis Date  . Carotid artery occlusion   . Cervicalgia   . Depressive disorder, not elsewhere classified   . BPH (benign prostatic hypertrophy)   . Pain in limb   . Unspecified transient cerebral ischemia   . Varicose veins   . Inguinal hernia without mention of obstruction or gangrene, unilateral or unspecified, (not specified as recurrent)   . Unspecified hemorrhoids without mention of complication   . Cerebrovascular disease, unspecified   . Hypertension   . Other psoriasis   . Backache, unspecified   . Stroke   . Anxiety   . Enlarged prostate   . Arthritis     Past Surgical History  Procedure Laterality Date  . Spine surgery  1992    Dr. Plomaritis  . Back surgery    . Hernia repair    . Vasectomy    . Colonoscopy    . Endarterectomy Left 07/14/2013    Procedure: ENDARTERECTOMY CAROTID-LEFT;  Surgeon: Vance W Brabham, MD;  Location: [facility] OR;  Service:  Vascular;  Laterality: Left;  . Patch angioplasty Left 07/14/2013    Procedure: PATCH ANGIOPLASTY using Vascu-Guard Vascular Patch;  Surgeon: Vance W Brabham, MD;  Location: [facility] OR;  Service: Vascular;  Laterality: Left;    History   Social History  . Marital Status: Widowed    Spouse Name: N/A    Number of Children: N/A  . Years of Education: N/A   Occupational History  . Not on file.   Social History Main Topics  . Smoking status: Never Smoker   . Smokeless tobacco: Never Used  . Alcohol Use: No  . Drug Use: No  . Sexual Activity: Not on file   Other Topics Concern  . Not on file   Social History Narrative  . No narrative on file    Family History  Problem Relation Age of Onset  . Heart attack Father   . Cancer Daughter     Allergies as of 09/05/2013 - Review Complete 09/05/2013  Allergen Reaction Noted  . Ace inhibitors  06/28/2013  . Cardura [doxazosin mesylate] Other (See Comments) 06/28/2013  . Penicillins Rash 06/28/2013    Current Outpatient Prescriptions on File Prior to Visit  Medication Sig Dispense Refill  . amLODipine (NORVASC) 10 MG tablet Take 10 mg by mouth daily.      .   aspirin 81 MG tablet Take 81 mg by mouth daily.      . atorvastatin (LIPITOR) 10 MG tablet Take 1 tablet (10 mg total) by mouth daily.  30 tablet  0  . betamethasone dipropionate (DIPROLENE) 0.05 % cream Apply topically 2 (two) times daily.      . clopidogrel (PLAVIX) 75 MG tablet Take 75 mg by mouth daily.      . fexofenadine (ALLEGRA) 180 MG tablet Take 180 mg by mouth daily.      . finasteride (PROSCAR) 5 MG tablet Take 5 mg by mouth daily.      . hydrochlorothiazide (HYDRODIURIL) 25 MG tablet Take 25 mg by mouth daily.      . hydrocortisone (ANUSOL-HC) 2.5 % rectal cream Place rectally 2 (two) times daily.      . metoprolol succinate (TOPROL-XL) 25 MG 24 hr tablet Take 25 mg by mouth daily as needed.       . mupirocin ointment (BACTROBAN) 2 % Using a q-tip apply to each nasal  area three times daily.  22 g  0  . oxyCODONE (ROXICODONE) 5 MG immediate release tablet Take 1 tablet (5 mg total) by mouth every 6 (six) hours as needed for pain.  30 tablet  0  . oxyCODONE-acetaminophen (PERCOCET/ROXICET) 5-325 MG per tablet Take 1-2 tablets by mouth every 4 (four) hours as needed.  30 tablet  0  . traMADol (ULTRAM) 50 MG tablet Take 1 tablet (50 mg total) by mouth every 6 (six) hours as needed for pain.  20 tablet  0   No current facility-administered medications on file prior to visit.     REVIEW OF SYSTEMS: Cardiovascular: No chest pain, chest pressure, palpitations, orthopnea, or dyspnea on exertion. No claudication or rest pain,  No history of DVT or phlebitis. Pulmonary: No productive cough, asthma or wheezing. Neurologic: No weakness, paresthesias, aphasia, or amaurosis. No dizziness. Hematologic: No bleeding problems or clotting disorders. Musculoskeletal: No joint pain or joint swelling. Gastrointestinal: No blood in stool or hematemesis Genitourinary: No dysuria or hematuria. Psychiatric:: No history of major depression. Integumentary: No rashes or ulcers. Constitutional: No fever or chills.  PHYSICAL EXAMINATION:   Vital signs are BP 130/86  Pulse 69  Resp 16  Ht 5' 8" (1.727 m)  Wt 208 lb (94.348 kg)  BMI 31.63 kg/m2 General: The patient appears their stated age. HEENT:  No gross abnormalities Pulmonary:  Non labored breathing Musculoskeletal: There are no major deformities. Neurologic: No focal weakness or paresthesias are detected, Skin: There are no ulcer or rashes noted. Psychiatric: The patient has normal affect. Cardiovascular: There is a regular rate and rhythm without significant murmur appreciated.   Diagnostic Studies Carotid ultrasound today shows greater than 80% right carotid stenosis.  The anatomy is normal past the bifurcation which on ultrasound was described as high  Assessment: Asymptomatic right carotid stenosis Plan: I  feel the patient has shown dramatic improvement since I last saw him.  He is functioning about at his new baseline.  He has a high-grade right carotid stenosis.  I have recommended right carotid endarterectomy for stroke prevention.  We again went over the risks and benefits of the operation which he fully understands.  He does not show any signs of persistent, and therefore I elected not to send him for ENT evaluation.  He has an old CT angiogram for several years ago which shows that his right carotid bifurcation is easily accessible.  I have scheduled his operation for Thursday, December   18.  He will continue with his aspirin and Plavix  V. Wells Brabham IV, [name] [facility] [place] Office: [phone] Pager:  [phone]    

## 2013-10-13 NOTE — Progress Notes (Signed)
Utilization review completed.  

## 2013-10-13 NOTE — Preoperative (Signed)
Beta Blockers   Reason not to administer Beta Blockers:Not Applicable 

## 2013-10-13 NOTE — Anesthesia Procedure Notes (Signed)
Procedure Name: Intubation Date/Time: 10/13/2013 10:56 AM Performed by: Reine Just Pre-anesthesia Checklist: Patient identified, Emergency Drugs available, Suction available, Patient being monitored and Timeout performed Patient Re-evaluated:Patient Re-evaluated prior to inductionOxygen Delivery Method: Circle system utilized and Simple face mask Preoxygenation: Pre-oxygenation with 100% oxygen Intubation Type: IV induction Ventilation: Mask ventilation without difficulty and Oral airway inserted - appropriate to patient size Laryngoscope Size: Mac and 4 Grade View: Grade II Tube type: Oral Tube size: 8.0 mm Number of attempts: 1 Airway Equipment and Method: Patient positioned with wedge pillow and Stylet Placement Confirmation: ETT inserted through vocal cords under direct vision,  positive ETCO2 and breath sounds checked- equal and bilateral Secured at: 24 cm Tube secured with: Tape Dental Injury: Teeth and Oropharynx as per pre-operative assessment

## 2013-10-13 NOTE — Progress Notes (Signed)
S/p right CEA Neuro intact post op Incision ok  Wells Brabham

## 2013-10-14 ENCOUNTER — Telehealth: Payer: Self-pay | Admitting: Surgery

## 2013-10-14 DIAGNOSIS — I6529 Occlusion and stenosis of unspecified carotid artery: Secondary | ICD-10-CM

## 2013-10-14 LAB — BASIC METABOLIC PANEL
Calcium: 9.3 mg/dL (ref 8.4–10.5)
GFR calc Af Amer: 64 mL/min — ABNORMAL LOW (ref >90)
GFR calc non Af Amer: 56 mL/min — ABNORMAL LOW (ref >90)
Glucose, Bld: 138 mg/dL — ABNORMAL HIGH (ref 70–99)
Sodium: 138 mEq/L (ref 135–145)

## 2013-10-14 LAB — CBC
Hemoglobin: 13.6 g/dL (ref 13.0–17.0)
MCH: 31.2 pg (ref 26.0–34.0)
MCHC: 34.8 g/dL (ref 30.0–36.0)

## 2013-10-14 MED ORDER — POTASSIUM CHLORIDE CRYS ER 20 MEQ PO TBCR
40.0000 meq | EXTENDED_RELEASE_TABLET | Freq: Once | ORAL | Status: AC
  Administered 2013-10-14: 40 meq via ORAL
  Filled 2013-10-14: qty 2

## 2013-10-14 MED ORDER — OXYCODONE-ACETAMINOPHEN 5-325 MG PO TABS: 1.0000 | ORAL_TABLET | Freq: Four times a day (QID) | ORAL | Status: DC | PRN

## 2013-10-14 NOTE — Op Note (Signed)
Patient name: [name] MRN: [id number] DOB: August 16, 1941 Sex: male  10/13/2013 Pre-operative Diagnosis: Asymptomatic   right carotid stenosis Post-operative diagnosis:  Same Surgeon:  Jorge Ny Assistants:  Doreatha Massed, PA Procedure:    right carotid Endarterectomy with bovine pericardial  patch angioplasty Anesthesia:  General Blood Loss:  See anesthesia record Specimens:  Carotid Plaque to pathology  Findings:  90 %stenosis; Thrombus:  Chronic  Indications:  The patient is previously undergone a left carotid endarterectomy.  He has had a stroke in the past, prior to carotid intervention.  He has recovered from this with some residual right leg weakness.  Ultrasound has identified at high-grade right carotid stenosis.  Procedure:  The patient was identified in the holding area and taken to [facility] OR ROOM 16  The patient was then placed supine on the table.   General endotrachial anesthesia was administered.  The patient was prepped and draped in the usual sterile fashion.  A time out was called and antibiotics were administered.  The incision was made along the anterior border of the right sternocleidomastoid muscle.  Cautery was used to dissect through the subcutaneous tissue.  The platysma muscle was divided with cautery.  The internal jugular vein was exposed along its anterior medial border.  The common facial vein was exposed and then divided between 2-0 silk ties and metal clips.  The common carotid artery was then circumferentially exposed and encircled with an umbilical tape.  The vagus nerve was identified and protected.  Next sharp dissection was used to expose the external carotid artery and the superior thyroid artery.  The were encircled with a blue vessel loop and a 2-0 silk tie respectively.  Finally, the internal carotid was carefully dissected free.  An umbilical tape was placed around the internal carotid artery distal to the diseased segment.  The hypoglossal  nerve was visualized throughout and protected.  The patient was given systemic heparinization.  A bovine carotid patch was selected and prepared on the back table.  A 10 french shunt was also prepared.  After blood pressure readings were appropriate and the heparin had been given time to circulate, the internal carotid artery was occluded with a baby Gregory clamp.  The external and common carotid arteries were then occluded with vascular clamps and the 2-0 tie tightened on the superior thyroid artery.  A #11 blade was used to make an arteriotomy in the common carotid artery.  This was extended with Potts scissors along the anterior and lateral border of the common and internal carotid artery.  Approximately 90% stenosis was identified.  There was chronic thrombus identified.  The 10 french shunt was not placed because of the difficulty of seeing the distal internal carotid artery underneath the hypoglossal nerve..  A kleiner kuntz elevator was used to perform endarterectomy.  An eversion endarterectomy was performed in the external carotid artery.  A good distal endpoint was obtained in the internal carotid artery.  The specimen was removed and sent to pathology.  Heparinized saline was used to irrigate the endarterectomized field.  All potential embolic debris was removed.  Bovine pericardial patch angioplasty was then performed using a running 6-0 Prolene.  The common internal and external carotid arteries were all appropriately flushed. The artery was again irrigated with heparin saline.  The anastomosis was then secured. The clamp was first released on the external carotid artery followed by the common carotid artery approximately 30 seconds later, bloodflow was reestablish through the internal  carotid artery.  Next, a hand-held  Doppler was used to evaluate the signals in the common, external, and internal  carotid arteries, all of which had appropriate signals. I then administered  50 mg protamine. The  wound was then irrigated.  After hemostasis was achieved, the carotid sheath was reapproximated with 3-0 Vicryl. The  platysma muscle was reapproximated with running 3-0 Vicryl. The skin  was closed with 4-0 Vicryl. Dermabond was placed on the skin. The  patient was then successfully extubated. His neurologic exam was  similar to his preprocedural exam. The patient was then taken to recovery room  in stable condition. There were no complications.     Disposition:  To PACU in stable condition.  Relevant Operative Details:  Ulcerated plaque which had ruptured and had evidence of chronic thrombus within the plaque.  This was at the proximal internal carotid artery.  This was nearly completely occlusive.  I did not place a shunt because the disease-free internal carotid artery was up underneath the hypoglossal nerve.  The patient did have pulsatile back bleeding  V. Durene Cal, [name] [facility] [place] Office: [phone] Pager:  [phone]

## 2013-10-14 NOTE — Progress Notes (Signed)
Patient discharge home via private vehicle. Percocet prescription given and wound care instructions explained.  Explained to patient that he should not drive or lift anything for 2 weeks.  Patient given time to ask question.

## 2013-10-14 NOTE — Progress Notes (Signed)
[facility] [place]  Subjective  -" I'm feeling fine."  Ambulated, eating and drinking fine.   Objective 119/75 78 98.4 F (36.9 C) (Oral) 20 94%  Intake/Output Summary (Last 24 hours) at 10/14/13 0726 Last data filed at 10/14/13 0640  Gross per 24 hour  Intake   3240 ml  Output   3020 ml  Net    220 ml    Palpable radial pulses equal bilateral Grip strength 5/5 equal No vision changes Symmetrical smile and no tongue deviation.  Assessment/Planning: POD #1 right CEA WBC 18.3 no fever, chills.  No recent cold or flu. Pre-op WBC 16.2 K 3.3 ordered 40 mEq of Kdur PO Plan to D/C home   Clinton Gallant [facility] 10/14/2013 7:26 AM --  Laboratory Lab Results:  Recent Labs  10/14/13 0430  WBC 18.3*  HGB 13.6  HCT 39.1  PLT 291   CBC    Component Value Date/Time   WBC 18.3* 10/14/2013 0430   RBC 4.36 10/14/2013 0430   HGB 13.6 10/14/2013 0430   HCT 39.1 10/14/2013 0430   PLT 291 10/14/2013 0430   MCV 89.7 10/14/2013 0430   MCH 31.2 10/14/2013 0430   MCHC 34.8 10/14/2013 0430   RDW 13.0 10/14/2013 0430      BMET  Recent Labs  10/14/13 0430  NA 138  K 3.3*  CL 98  CO2 27  GLUCOSE 138*  BUN 17  CREATININE 1.24  CALCIUM 9.3    COAG Lab Results  Component Value Date   INR 0.96 10/04/2013   INR 0.98 07/12/2013   INR 1.0 06/16/2009   No results found for this basename: PTT

## 2013-10-14 NOTE — Telephone Encounter (Addendum)
Message copied by Fredrich Birks on Fri Oct 14, 2013 10:24 AM ------      Message from: Dara Lords      Created: Thu Oct 13, 2013  1:27 PM       S/p right CEA 10/13/13.  F/u with Dr. Myra Gianotti in 2 weeks.            Thanks,      Lelon Mast ------  10/14/13: lm for pt on cell # re appt info. dpm

## 2013-10-14 NOTE — Discharge Summary (Signed)
I agree with the above.  I have seen and examined the patient.  He is postoperative day one from a right carotid endarterectomy.  He is neurologically intact.  He is set for discharge with followup in 2 weeks.  Durene Cal

## 2013-10-14 NOTE — Discharge Summary (Addendum)
[facility] Discharge Summary   Patient ID:  [name] MRN: [id number] DOB/AGE: 12/22/1940 72 y.o.  Admit date: 10/13/2013 Discharge date: 10/14/2013 Date of Surgery: 10/13/2013 Surgeon: Surgeon(s): Nada Libman, MD  Admission Diagnosis: RIGHT ICA STENOSIS  Discharge Diagnoses:  RIGHT ICA STENOSIS  Secondary Diagnoses: Past Medical History  Diagnosis Date  . Carotid artery occlusion   . Cervicalgia   . Depressive disorder, not elsewhere classified   . BPH (benign prostatic hypertrophy)   . Pain in limb   . Unspecified transient cerebral ischemia   . Varicose veins   . Inguinal hernia without mention of obstruction or gangrene, unilateral or unspecified, (not specified as recurrent)   . Unspecified hemorrhoids without mention of complication   . Cerebrovascular disease, unspecified   . Hypertension   . Other psoriasis   . Backache, unspecified   . Stroke   . Anxiety   . Enlarged prostate   . Arthritis     Procedure(s): RIGHT CAROTID ARTERY ENDARTERECTOMY WITH VASCU GUARD PATCH ANGIOPLASTY  Discharged Condition: good  HPI: The patient initially had a stroke in August of 2014. During his workup, he was found to have high-grade bilateral carotid stenosis. His symptoms included right arm and leg weakness as well as a decline in his cognition. On 07/14/2013 he underwent left carotid endarterectomy. He was somewhat slow to wake up after his operation. He is ultimately able to discharge home 2 days after his endarterectomy. When I saw him at his first postoperative visit I did not feel like he was quite back to his baseline and elected to delay his right-sided endarterectomy. Since I had last seen him he feels like his memory is back to normal. He is doing regular activities. He denies having symptoms in his left side.  Carotid ultrasound today shows greater than 80% right carotid stenosis. The anatomy is normal past the bifurcation which on ultrasound  was described as high.     He underwent surgery by Dr. Myra Gianotti on 10/13/2013.  S/P right carotid endarterectomy.  He was admitted to 3300 for close observation. Post-op day 1 he was alert and oriented, ambulated, independently voided.  He is ready to be discharged home.      Hospital Course:  DEMETRIES COIA is a 72 y.o. male is S/P Right Procedure(s): RIGHT CAROTID ARTERY ENDARTERECTOMY WITH VASCU GUARD PATCH ANGIOPLASTY Extubated: POD # 0 Physical exam: Palpable radial pulses equal bilateral  Grip strength 5/5 equal  No vision changes  Symmetrical smile and no tongue deviation.     Post-op wounds healing well Pt. Ambulating, voiding and taking PO diet without difficulty. Pt pain controlled with PO pain meds. Labs as below Complications:none  Consults:     Significant Diagnostic Studies: CBC Lab Results  Component Value Date   WBC 18.3* 10/14/2013   HGB 13.6 10/14/2013   HCT 39.1 10/14/2013   MCV 89.7 10/14/2013   PLT 291 10/14/2013    BMET    Component Value Date/Time   NA 138 10/14/2013 0430   K 3.3* 10/14/2013 0430   CL 98 10/14/2013 0430   CO2 27 10/14/2013 0430   GLUCOSE 138* 10/14/2013 0430   BUN 17 10/14/2013 0430   CREATININE 1.24 10/14/2013 0430   CALCIUM 9.3 10/14/2013 0430   GFRNONAA 56* 10/14/2013 0430   GFRAA 64* 10/14/2013 0430   COAG Lab Results  Component Value Date   INR 0.96 10/04/2013   INR 0.98 07/12/2013   INR 1.0 06/16/2009  Disposition:  Discharge to :Home Discharge Orders   Future Orders Complete By Expires   Call MD for:  redness, tenderness, or signs of infection (pain, swelling, bleeding, redness, odor or green/yellow discharge around incision site)  As directed    Call MD for:  severe or increased pain, loss or decreased feeling  in affected limb(s)  As directed    Call MD for:  temperature >100.5  As directed    CAROTID Sugery: Call MD for difficulty swallowing or speaking; weakness in arms or legs that is a new  symtom; severe headache.  If you have increased swelling in the neck and/or  are having difficulty breathing, CALL 911  As directed    Discharge patient  As directed    Comments:     Discharge pt to home once Dr. Myra Gianotti has seen patient   Discharge wound care:  As directed    Comments:     Shower daily with soap and water starting 10/15/13   Driving Restrictions  As directed    Comments:     No driving for 2 weeks   Lifting restrictions  As directed    Comments:     No lifting for 2 weeks   Nursing communication  As directed    Scheduling Instructions:     Please give paper Rx to patient at discharge.  It is located on the paper chart.   Resume previous diet  As directed        Medication List         amLODipine 10 MG tablet  Commonly known as:  NORVASC  Take 10 mg by mouth daily.     aspirin 81 MG tablet  Take 81 mg by mouth daily.     atorvastatin 10 MG tablet  Commonly known as:  LIPITOR  Take 1 tablet (10 mg total) by mouth daily.     betamethasone dipropionate 0.05 % cream  Commonly known as:  DIPROLENE  Apply 1 application topically 2 (two) times daily as needed (to affected area).     clopidogrel 75 MG tablet  Commonly known as:  PLAVIX  Take 75 mg by mouth daily.     fexofenadine 180 MG tablet  Commonly known as:  ALLEGRA  Take 180 mg by mouth daily as needed for allergies.     finasteride 5 MG tablet  Commonly known as:  PROSCAR  Take 5 mg by mouth daily.     hydrochlorothiazide 25 MG tablet  Commonly known as:  HYDRODIURIL  Take 25 mg by mouth daily.     oxyCODONE-acetaminophen 5-325 MG per tablet  Commonly known as:  PERCOCET/ROXICET  Take 1 tablet by mouth every 6 (six) hours as needed for moderate pain or severe pain.     oxyCODONE-acetaminophen 5-325 MG per tablet  Commonly known as:  PERCOCET/ROXICET  Take 1 tablet by mouth every 6 (six) hours as needed for moderate pain or severe pain.     traMADol 50 MG tablet  Commonly known as:   ULTRAM  Take 1 tablet (50 mg total) by mouth every 6 (six) hours as needed for pain.       Verbal and written Discharge instructions given to the patient. Wound care per Discharge AVS     Follow-up Information   Follow up with Myra Gianotti IV, Lala Lund, MD In 2 weeks. (Office will call you to arrange your appt (sent))    Specialty:  Vascular Surgery   Contact information:   [id number]  [address] [place] [address] [phone]       Signed: Mosetta Pigeon 10/14/2013, 7:51 AM  --- For VQI Registry use --- Instructions: Press F2 to tab through selections.  Delete question if not applicable.   Modified Rankin score at D/C (0-6): Rankin Score=0  IV medication needed for:  1. Hypertension: No 2. Hypotension: No  Post-op Complications: No  1. Post-op CVA or TIA: No  If yes: Event classification (right eye, left eye, right cortical, left cortical, verterobasilar, other):   If yes: Timing of event (intra-op, <6 hrs post-op, >=6 hrs post-op, unknown):   2. CN injury: No  If yes: CN  injuried   3. Myocardial infarction: No  If yes: Dx by (EKG or clinical, Troponin):   4.  CHF: No  5.  Dysrhythmia (new): No  6. Wound infection: No  7. Reperfusion symptoms: No  8. Return to OR: No  If yes: return to OR for (bleeding, neurologic, other CEA incision, other):   Discharge medications: Statin use:  Yes ASA use:  Yes Beta blocker use:  NO ACE-Inhibitor use:  Yes P2Y12 Antagonist use: [ ]  None, [x ] Plavix, [ ]  Plasugrel, [ ]  Ticlopinine, [ ]  Ticagrelor, [ ]  Other, [ ]  No for medical reason, [ ]  Non-compliant, [ ]  Not-indicated Anti-coagulant use:  [ x] None, [ ]  Warfarin, [ ]  Rivaroxaban, [ ]  Dabigatran, [ ]  Other, [ ]  No for medical reason, [ ]  Non-compliant, [ ]  Not-indicated  I agree with the above.  I have seen and examined the patient.  He is postoperative day one from a right carotid endarterectomy.  He is neurologically intact.  He is set for discharge with  followup in 2 weeks.  Durene Cal

## 2013-10-18 ENCOUNTER — Encounter (HOSPITAL_COMMUNITY): Payer: Self-pay | Admitting: Surgery

## 2013-10-18 ENCOUNTER — Telehealth: Payer: Self-pay | Admitting: *Deleted

## 2013-10-18 NOTE — Telephone Encounter (Signed)
Mr Spong stated he had a little more swelling with this side than he did on the other. He said he was not having trouble swallowing, moving his tongue; no headache, incision dry intact without reddness or drainage. I told him it was probably normal, just to give it alittle more time. However if it increased in size or anyother symptoms started to call us back or head to the ER. He verbalized understanding.

## 2013-10-28 ENCOUNTER — Encounter: Payer: Self-pay | Admitting: Surgery

## 2013-10-31 ENCOUNTER — Encounter: Payer: Medicare Other | Admitting: Surgery

## 2013-11-04 ENCOUNTER — Ambulatory Visit: Payer: Medicare Other | Admitting: Family

## 2013-11-07 ENCOUNTER — Encounter: Payer: Medicare Other | Admitting: Surgery

## 2013-11-08 ENCOUNTER — Encounter: Payer: Self-pay | Admitting: Family

## 2013-11-09 ENCOUNTER — Ambulatory Visit: Payer: Medicare Other | Admitting: Family

## 2013-11-10 ENCOUNTER — Ambulatory Visit (INDEPENDENT_AMBULATORY_CARE_PROVIDER_SITE_OTHER): Payer: Self-pay | Admitting: Family

## 2013-11-10 ENCOUNTER — Encounter: Payer: Self-pay | Admitting: Family

## 2013-11-10 VITALS — BP 119/77 | HR 77 | Resp 16 | Ht 69.0 in | Wt 210.0 lb

## 2013-11-10 DIAGNOSIS — I6529 Occlusion and stenosis of unspecified carotid artery: Secondary | ICD-10-CM

## 2013-11-10 DIAGNOSIS — Z48812 Encounter for surgical aftercare following surgery on the circulatory system: Secondary | ICD-10-CM | POA: Insufficient documentation

## 2013-11-10 NOTE — Patient Instructions (Signed)

## 2013-11-10 NOTE — Progress Notes (Signed)
Established Carotid Patient  History of Present Illness  [name] is a 73 y.o. male who is s/p right carotid endarterectomy with bovine pericardial patch angioplasty on 10/13/13, and left CEA on 06/13/13, both by Dr. Trula Slade. He returns today for follow up. He denies dysphagia, denies dysphonia, denies numbness at either CEA surgical site. 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 In August, 2014 he had a TIA before the left CEA, denies stroke or TIA symptoms since then. He does not use ETOH. He denies cardiac problems.   The patient denies amaurosis fugax or monocular blindness.  The patient  denies facial drooping.  The patient denies receptive or expressive aphasia.    Patient denies New Medical or Surgical History.  Pt Diabetic: No Pt smoker: non-smoker  Pt meds include: Statin : Yes ASA: Yes Other anticoagulants/antiplatelets: Plavix    Past Medical History  Diagnosis Date  . Carotid artery occlusion   . Cervicalgia   . Depressive disorder, not elsewhere classified   . BPH (benign prostatic hypertrophy)   . Pain in limb   . Unspecified transient cerebral ischemia   . Varicose veins   . Inguinal hernia without mention of obstruction or gangrene, unilateral or unspecified, (not specified as recurrent)   . Unspecified hemorrhoids without mention of complication   . Cerebrovascular disease, unspecified   . Hypertension   . Other psoriasis   . Backache, unspecified   . Stroke   . Anxiety   . Enlarged prostate   . Arthritis     Social History History  Substance Use Topics  . Smoking status: Never Smoker   . Smokeless tobacco: Never Used  . Alcohol Use: No    Family History Family History  Problem Relation Age of Onset  . Heart attack Father   . Cancer Daughter     Surgical History Past Surgical History  Procedure  Laterality Date  . Spine surgery  1992    Dr. Amado Coe  . Back surgery    . Hernia repair    . Vasectomy    . Colonoscopy    . Endarterectomy Left 07/14/2013    Procedure: ENDARTERECTOMY CAROTID-LEFT;  Surgeon: Serafina Mitchell, MD;  Location: [facility];  Service: Vascular;  Laterality: Left;  . Patch angioplasty Left 07/14/2013    Procedure: PATCH ANGIOPLASTY using Vascu-Guard Vascular Patch;  Surgeon: Serafina Mitchell, MD;  Location: [facility];  Service: Vascular;  Laterality: Left;  . Endarterectomy Right 10/13/2013    Procedure: RIGHT CAROTID ARTERY ENDARTERECTOMY WITH VASCU GUARD PATCH ANGIOPLASTY;  Surgeon: Serafina Mitchell, MD;  Location: [facility];  Service: Vascular;  Laterality: Right;  . Carotid endarterectomy Right 10-13-13    cea  . Carotid endarterectomy Left 07-14-13    cea    Allergies  Allergen Reactions  . Ace Inhibitors     POSSIBLE ANGIOEDEMA  . Cardura [Doxazosin Mesylate] Other (See Comments)    Erythema  . Penicillins Rash    RASH    Current Outpatient Prescriptions  Medication Sig Dispense Refill  . amLODipine (NORVASC) 10 MG tablet Take 10 mg by mouth daily.      Marland Kitchen aspirin 81 MG tablet Take 81 mg by mouth daily.      Marland Kitchen atorvastatin (LIPITOR) 10 MG tablet Take 1 tablet (10 mg total) by mouth daily.  30 tablet  0  . betamethasone  dipropionate (DIPROLENE) 0.05 % cream Apply 1 application topically 2 (two) times daily as needed (to affected area).       . clopidogrel (PLAVIX) 75 MG tablet Take 75 mg by mouth daily.      . finasteride (PROSCAR) 5 MG tablet Take 5 mg by mouth daily.      . hydrochlorothiazide (HYDRODIURIL) 25 MG tablet Take 25 mg by mouth daily.      . fexofenadine (ALLEGRA) 180 MG tablet Take 180 mg by mouth daily as needed for allergies.       Marland Kitchen oxyCODONE-acetaminophen (PERCOCET/ROXICET) 5-325 MG per tablet Take 1 tablet by mouth every 6 (six) hours as needed for moderate pain or severe pain.  30 tablet  0  . oxyCODONE-acetaminophen (PERCOCET/ROXICET)  5-325 MG per tablet Take 1 tablet by mouth every 6 (six) hours as needed for moderate pain or severe pain.  30 tablet  0  . traMADol (ULTRAM) 50 MG tablet Take 1 tablet (50 mg total) by mouth every 6 (six) hours as needed for pain.  20 tablet  0   No current facility-administered medications for this visit.    Review of Systems : See HPI for pertinent positives and negatives.  Physical Examination  Filed Vitals:   11/10/13 1104  BP: 119/77  Pulse: 77  Resp: 16   Filed Weights   11/10/13 1104  Weight: 210 lb (95.255 kg)   Body mass index is 31 kg/(m^2).   General: WDWN obese male in NAD GAIT: normal Eyes: PERRLA Pulmonary:  CTAB, Negative  Rales, Negative rhonchi, & Negative wheezing.  Cardiac: regular Rhythm ,  Negative Murmurs.  VASCULAR EXAM Carotid Bruits Left Right   Negative Negative   Radial pulses are 2+ palpable and equal.                                                                                                                            LE Pulses LEFT RIGHT       POPLITEAL  not palpable   not palpable       POSTERIOR TIBIAL   palpable    palpable        DORSALIS PEDIS      ANTERIOR TIBIAL  palpable   palpable     Gastrointestinal: soft, nontender, BS WNL, no r/g,  negative masses.  Musculoskeletal: Negative muscle atrophy/wasting. M/S 5/5 throughout except 4/5 in RUE and RLE, Extremities without ischemic changes.  Skin: Both CEA sites are well healed, minimal swelling in right CEA site, no redness at right CEA site, no drainage.  Neurologic: A&O X 3; Appropriate Affect ; SENSATION ;normal;  Speech is normal CN 2-12 intact, Pain and light touch intact in extremities, Motor exam as listed above.  Assessment: [name] is a 73 y.o. male who is s/p right carotid endarterectomy with bovine pericardial patch angioplasty on 10/13/13, and left CEA on 06/13/13. His most recent CEA incision is healing well, minimal swelling, no signs  of infection,  no loss of sensation at the incision; pt denies dysphagia, denies dysphonia. No TIA or stroke activity since the TIA before his left CEA in August. No neurological deficits other than occasional mild weakness in his right leg that is residual from 2010 CVA.  Plan:  Follow-up as scheduled with Dr. Trula Slade, if not scheduled, then follow up in 1 month with Dr. Trula Slade.   I discussed in depth with the patient the nature of atherosclerosis, and emphasized the importance of maximal medical management including strict control of blood pressure, blood glucose, and lipid levels, obtaining regular exercise, and continued cessation of smoking.  The patient is aware that without maximal medical management the underlying atherosclerotic disease process will progress, limiting the benefit of any interventions.  The patient was given information about stroke prevention and what symptoms should prompt the patient to seek immediate medical care.  Thank you for allowing [place] to participate in this patient's care.  Clemon Chambers, RN, MSN, FNP-C [facility] Office: [phone]  Clinic Physician: Oneida Alar  11/10/2013 11:14 AM

## 2013-11-15 ENCOUNTER — Ambulatory Visit (INDEPENDENT_AMBULATORY_CARE_PROVIDER_SITE_OTHER): Payer: Self-pay | Admitting: *Deleted

## 2013-11-15 DIAGNOSIS — I639 Cerebral infarction, unspecified: Secondary | ICD-10-CM

## 2013-11-15 DIAGNOSIS — I6350 Cerebral infarction due to unspecified occlusion or stenosis of unspecified cerebral artery: Secondary | ICD-10-CM

## 2013-11-15 NOTE — Progress Notes (Signed)
Participant in the office today for his IRIS 4th. Annual study visit. MMSE was completed successfully.  Blood was drawn and shipped. Participant is not on study drug because of the Bladder Cancer risk. Participant to continue with follow-up visits per IRIS protocol. Next visit should be in April, 2015.

## 2013-11-18 ENCOUNTER — Encounter: Payer: Self-pay | Admitting: Surgery

## 2013-11-21 ENCOUNTER — Encounter: Payer: Medicare Other | Admitting: Surgery

## 2013-12-02 ENCOUNTER — Encounter: Payer: Self-pay | Admitting: Surgery

## 2013-12-05 ENCOUNTER — Encounter: Payer: Self-pay | Admitting: Surgery

## 2013-12-05 ENCOUNTER — Ambulatory Visit (INDEPENDENT_AMBULATORY_CARE_PROVIDER_SITE_OTHER): Payer: Self-pay | Admitting: Surgery

## 2013-12-05 VITALS — BP 139/83 | HR 83 | Temp 98.3°F | Ht 69.0 in | Wt 215.3 lb

## 2013-12-05 DIAGNOSIS — I6529 Occlusion and stenosis of unspecified carotid artery: Secondary | ICD-10-CM

## 2013-12-05 NOTE — Progress Notes (Signed)
[facility] POST OPERATIVE OFFICE NOTE  CC:  F/u for surgery  HPI:  This is a 73 y.o. male who is s/p right CEA 10/14/13.  He is doing well this visit.  He states he still has some RLE weakness, but this is has not worsened since surgery and is the same as before surgery.  He is tearful today as he speaks of his daughter's battle with ovarian cancer.  Allergies  Allergen Reactions  . Ace Inhibitors     POSSIBLE ANGIOEDEMA  . Cardura [Doxazosin Mesylate] Other (See Comments)    Erythema  . Penicillins Rash    RASH    Current Outpatient Prescriptions  Medication Sig Dispense Refill  . amLODipine (NORVASC) 10 MG tablet Take 10 mg by mouth daily.      Marland Kitchen aspirin 81 MG tablet Take 81 mg by mouth daily.      Marland Kitchen atorvastatin (LIPITOR) 10 MG tablet Take 1 tablet (10 mg total) by mouth daily.  30 tablet  0  . betamethasone dipropionate (DIPROLENE) 0.05 % cream Apply 1 application topically 2 (two) times daily as needed (to affected area).       . clopidogrel (PLAVIX) 75 MG tablet Take 75 mg by mouth daily.      . fexofenadine (ALLEGRA) 180 MG tablet Take 180 mg by mouth daily as needed for allergies.       . finasteride (PROSCAR) 5 MG tablet Take 5 mg by mouth daily.      . hydrochlorothiazide (HYDRODIURIL) 25 MG tablet Take 25 mg by mouth daily.      Marland Kitchen oxyCODONE-acetaminophen (PERCOCET/ROXICET) 5-325 MG per tablet Take 1 tablet by mouth every 6 (six) hours as needed for moderate pain or severe pain.  30 tablet  0  . oxyCODONE-acetaminophen (PERCOCET/ROXICET) 5-325 MG per tablet Take 1 tablet by mouth every 6 (six) hours as needed for moderate pain or severe pain.  30 tablet  0  . traMADol (ULTRAM) 50 MG tablet Take 1 tablet (50 mg total) by mouth every 6 (six) hours as needed for pain.  20 tablet  0   No current facility-administered medications for this visit.     ROS:  See HPI  Physical Exam:  Filed Vitals:   12/05/13 1517  BP: 139/83  Pulse:   Temp:      Incision:  Right incision is c/d/i and healing nicely.  Left incision with a slightly raised area, but this is also healing nicely.  Extremities:  Motor strength is slightly less in the RLE than left Neuro: in tact with exception of RLE weakness  A/P:  This is a 73 y.o. male here for f/u to right CEA.  He continues to do well.  He does have a RLE weakness, but this is unchanged from before surgery.  W  We will see him back in 6 months with a carotid duplex scan.   Leontine Locket, PA-C [facility] [phone]  Clinic MD:  Pt seen and examined with Dr. Trula Slade  I agree with the above.  I have seen and examined the patient.  His right carotid incision has healed nicely.  He has no residual deficits from his operation.  Overall, he looks very healthy today.  I have scheduled him to come back in 6 months with a repeat carotid ultrasound.

## 2013-12-06 ENCOUNTER — Other Ambulatory Visit: Payer: Self-pay | Admitting: *Deleted

## 2013-12-06 DIAGNOSIS — I6529 Occlusion and stenosis of unspecified carotid artery: Secondary | ICD-10-CM

## 2014-01-13 ENCOUNTER — Telehealth: Payer: Self-pay | Admitting: *Deleted

## 2014-01-13 NOTE — Telephone Encounter (Signed)
Reviewed with [name] the information I had left on earlier detailed phone message.  Explained that on his office visit with Dr. Trula Slade on 12-05-2013 the documentation had stated that his carotid incision had healed nicely and that he had no residual deficits from his surgery.  From a surgical standpoint, he has a clean bill of health and can return to work.   Stated based on the written documentation from Dr. Trula Slade we could not extend his time off from work as he is three months post surgery with no new problems.

## 2014-01-13 NOTE — Telephone Encounter (Signed)
Left detailed telephone voice message that on 12-05-2013 when he had a post op visit with Dr. Trula Slade he was given a clean bill of health from a surgical standpoint and had no side effects from his surgical procedure.  Since he is 3 months post surgery, our office is unable to approve additional time off from work.  Encouraged him to call back if he had further questions.

## 2014-02-02 ENCOUNTER — Encounter: Payer: Self-pay | Admitting: *Deleted

## 2014-06-02 ENCOUNTER — Encounter: Payer: Self-pay | Admitting: Family

## 2014-06-05 ENCOUNTER — Other Ambulatory Visit (HOSPITAL_COMMUNITY): Payer: Medicare Other

## 2014-06-05 ENCOUNTER — Ambulatory Visit: Payer: Medicare Other | Admitting: Family

## 2014-08-02 ENCOUNTER — Other Ambulatory Visit (HOSPITAL_COMMUNITY): Payer: Medicare Other

## 2014-08-02 ENCOUNTER — Ambulatory Visit: Payer: Medicare Other | Admitting: Family

## 2015-03-14 ENCOUNTER — Encounter: Payer: Self-pay | Admitting: Cardiology

## 2015-04-23 ENCOUNTER — Other Ambulatory Visit: Payer: Self-pay

## 2015-05-16 ENCOUNTER — Encounter: Payer: Self-pay | Admitting: Cardiology

## 2015-06-02 ENCOUNTER — Encounter (HOSPITAL_COMMUNITY): Payer: Self-pay | Admitting: Emergency Medicine

## 2015-06-02 ENCOUNTER — Inpatient Hospital Stay (HOSPITAL_COMMUNITY)
Admission: EM | Admit: 2015-06-02 | Discharge: 2015-06-07 | DRG: 287 | Disposition: A | Discharge status: Home / Self Care | Payer: Medicare Other | Attending: Cardiology | Admitting: Cardiology

## 2015-06-02 DIAGNOSIS — I25119 Atherosclerotic heart disease of native coronary artery with unspecified angina pectoris: Secondary | ICD-10-CM | POA: Diagnosis present

## 2015-06-02 DIAGNOSIS — Z91018 Allergy to other foods: Secondary | ICD-10-CM

## 2015-06-02 DIAGNOSIS — I447 Left bundle-branch block, unspecified: Secondary | ICD-10-CM | POA: Diagnosis not present

## 2015-06-02 DIAGNOSIS — G459 Transient cerebral ischemic attack, unspecified: Secondary | ICD-10-CM

## 2015-06-02 DIAGNOSIS — Z8249 Family history of ischemic heart disease and other diseases of the circulatory system: Secondary | ICD-10-CM

## 2015-06-02 DIAGNOSIS — Z7982 Long term (current) use of aspirin: Secondary | ICD-10-CM

## 2015-06-02 DIAGNOSIS — R9431 Abnormal electrocardiogram [ECG] [EKG]: Secondary | ICD-10-CM

## 2015-06-02 DIAGNOSIS — M1990 Unspecified osteoarthritis, unspecified site: Secondary | ICD-10-CM | POA: Diagnosis present

## 2015-06-02 DIAGNOSIS — Z880 Allergy status to penicillin: Secondary | ICD-10-CM

## 2015-06-02 DIAGNOSIS — Z7902 Long term (current) use of antithrombotics/antiplatelets: Secondary | ICD-10-CM

## 2015-06-02 DIAGNOSIS — Z8673 Personal history of transient ischemic attack (TIA), and cerebral infarction without residual deficits: Secondary | ICD-10-CM

## 2015-06-02 DIAGNOSIS — R42 Dizziness and giddiness: Secondary | ICD-10-CM | POA: Diagnosis not present

## 2015-06-02 DIAGNOSIS — N400 Enlarged prostate without lower urinary tract symptoms: Secondary | ICD-10-CM | POA: Diagnosis present

## 2015-06-02 DIAGNOSIS — Z888 Allergy status to other drugs, medicaments and biological substances status: Secondary | ICD-10-CM

## 2015-06-02 DIAGNOSIS — R0600 Dyspnea, unspecified: Secondary | ICD-10-CM

## 2015-06-02 DIAGNOSIS — I77819 Aortic ectasia, unspecified site: Secondary | ICD-10-CM | POA: Diagnosis present

## 2015-06-02 DIAGNOSIS — Z79891 Long term (current) use of opiate analgesic: Secondary | ICD-10-CM

## 2015-06-02 DIAGNOSIS — Z79899 Other long term (current) drug therapy: Secondary | ICD-10-CM

## 2015-06-02 DIAGNOSIS — I440 Atrioventricular block, first degree: Secondary | ICD-10-CM | POA: Diagnosis present

## 2015-06-02 DIAGNOSIS — I679 Cerebrovascular disease, unspecified: Secondary | ICD-10-CM | POA: Diagnosis present

## 2015-06-02 DIAGNOSIS — I10 Essential (primary) hypertension: Secondary | ICD-10-CM | POA: Diagnosis present

## 2015-06-02 LAB — CBC
HCT: 42.6 % (ref 39.0–52.0)
HEMATOCRIT: 40.6 % (ref 39.0–52.0)
Hemoglobin: 14.7 g/dL (ref 13.0–17.0)
Hemoglobin: 13.7 g/dL (ref 13.0–17.0)
MCH: 31.1 pg (ref 26.0–34.0)
MCH: 30.5 pg (ref 26.0–34.0)
MCHC: 34.5 g/dL (ref 30.0–36.0)
MCHC: 33.7 g/dL (ref 30.0–36.0)
MCV: 90.1 fL (ref 78.0–100.0)
MCV: 90.4 fL (ref 78.0–100.0)
Platelets: 261 10*3/uL (ref 150–400)
Platelets: 265 10*3/uL (ref 150–400)
RBC: 4.73 MIL/uL (ref 4.22–5.81)
RBC: 4.49 MIL/uL (ref 4.22–5.81)
RDW: 13 % (ref 11.5–15.5)
RDW: 13 % (ref 11.5–15.5)
WBC: 13.1 10*3/uL — ABNORMAL HIGH (ref 4.0–10.5)
WBC: 11.9 10*3/uL — ABNORMAL HIGH (ref 4.0–10.5)

## 2015-06-02 LAB — BASIC METABOLIC PANEL
Anion gap: 12 (ref 5–15)
BUN: 15 mg/dL (ref 6–20)
CO2: 23 mmol/L (ref 22–32)
Calcium: 9.1 mg/dL (ref 8.9–10.3)
Chloride: 102 mmol/L (ref 101–111)
Creatinine, Ser: 1.26 mg/dL — ABNORMAL HIGH (ref 0.61–1.24)
GFR calc Af Amer: >60 mL/min (ref >60)
GFR calc non Af Amer: 54 mL/min — ABNORMAL LOW (ref >60)
Glucose, Bld: 126 mg/dL — ABNORMAL HIGH (ref 65–99)
Potassium: 3.7 mmol/L (ref 3.5–5.1)
Sodium: 137 mmol/L (ref 135–145)

## 2015-06-02 LAB — CREATININE, SERUM
CREATININE: 1.2 mg/dL (ref 0.61–1.24)
GFR calc non Af Amer: 57 mL/min — ABNORMAL LOW (ref >60)

## 2015-06-02 LAB — TROPONIN I
Troponin I: <0.03 ng/mL (ref <0.031)
Troponin I: <0.03 ng/mL (ref <0.031)

## 2015-06-02 LAB — BRAIN NATRIURETIC PEPTIDE: B Natriuretic Peptide: 36.1 pg/mL (ref 0.0–100.0)

## 2015-06-02 LAB — LIPID PANEL
CHOLESTEROL: 101 mg/dL (ref 0–200)
HDL: 37 mg/dL — ABNORMAL LOW (ref >40)
LDL Cholesterol: 42 mg/dL (ref 0–99)
TRIGLYCERIDES: 108 mg/dL (ref <150)
Total CHOL/HDL Ratio: 2.7 RATIO
VLDL: 22 mg/dL (ref 0–40)

## 2015-06-02 LAB — I-STAT TROPONIN, ED: Troponin i, poc: 0 ng/mL (ref 0.00–0.08)

## 2015-06-02 LAB — TSH: TSH: 2.838 u[IU]/mL (ref 0.350–4.500)

## 2015-06-02 MED ORDER — ENOXAPARIN SODIUM 40 MG/0.4ML ~~LOC~~ SOLN
40.0000 mg | SUBCUTANEOUS | Status: DC
  Administered 2015-06-02 – 2015-06-05 (×4): 40 mg via SUBCUTANEOUS
  Filled 2015-06-02 (×5): qty 0.4

## 2015-06-02 MED ORDER — ONDANSETRON HCL 4 MG/2ML IJ SOLN: 4.0000 mg | Freq: Four times a day (QID) | INTRAMUSCULAR | Status: DC | PRN

## 2015-06-02 MED ORDER — HYDROCHLOROTHIAZIDE 25 MG PO TABS
25.0000 mg | ORAL_TABLET | Freq: Every day | ORAL | Status: DC
  Administered 2015-06-02: 25 mg via ORAL
  Filled 2015-06-02 (×2): qty 1

## 2015-06-02 MED ORDER — NITROGLYCERIN 0.4 MG SL SUBL: 0.4000 mg | SUBLINGUAL_TABLET | SUBLINGUAL | Status: DC | PRN

## 2015-06-02 MED ORDER — ASPIRIN EC 81 MG PO TBEC
81.0000 mg | DELAYED_RELEASE_TABLET | Freq: Every day | ORAL | Status: DC
  Administered 2015-06-02 – 2015-06-07 (×5): 81 mg via ORAL
  Filled 2015-06-02 (×6): qty 1

## 2015-06-02 MED ORDER — ATORVASTATIN CALCIUM 40 MG PO TABS
40.0000 mg | ORAL_TABLET | Freq: Every day | ORAL | Status: DC
  Administered 2015-06-02 – 2015-06-07 (×6): 40 mg via ORAL
  Filled 2015-06-02 (×6): qty 1

## 2015-06-02 MED ORDER — HEPARIN (PORCINE) IN NACL 100-0.45 UNIT/ML-% IJ SOLN
1000.0000 [IU]/h | INTRAMUSCULAR | Status: DC
  Administered 2015-06-02: 1000 [IU]/h via INTRAVENOUS
  Filled 2015-06-02: qty 250

## 2015-06-02 MED ORDER — AMLODIPINE BESYLATE 10 MG PO TABS
10.0000 mg | ORAL_TABLET | Freq: Every day | ORAL | Status: DC
  Administered 2015-06-02: 10:00:00 10 mg via ORAL
  Filled 2015-06-02 (×2): qty 1

## 2015-06-02 MED ORDER — OXYCODONE-ACETAMINOPHEN 5-325 MG PO TABS: 1.0000 | ORAL_TABLET | Freq: Four times a day (QID) | ORAL | Status: DC | PRN

## 2015-06-02 MED ORDER — ACETAMINOPHEN 325 MG PO TABS: 650.0000 mg | ORAL_TABLET | ORAL | Status: DC | PRN

## 2015-06-02 MED ORDER — FINASTERIDE 5 MG PO TABS
5.0000 mg | ORAL_TABLET | Freq: Every day | ORAL | Status: DC
  Administered 2015-06-02 – 2015-06-07 (×6): 5 mg via ORAL
  Filled 2015-06-02 (×6): qty 1

## 2015-06-02 MED ORDER — ASPIRIN 81 MG PO TABS: 81.0000 mg | ORAL_TABLET | Freq: Every day | ORAL | Status: DC

## 2015-06-02 NOTE — ED Notes (Addendum)
Per [facility] Pt arrived to [facility] hospital 4 hrs prior to arrival to [facility]. Pt c/o dizziness and SOB. [facility] hospital showed 12 lead elevation in 2,3, and AVF. Negative troponins. Pt has received 324 of aspirin, 4,000 units heparin bolus, and heparin drip at 25mg /hr. BP 151/92, HR 62, 93% RA, 18 R.

## 2015-06-02 NOTE — ED Notes (Signed)
Dr Fuller Plan evaluated on arrival. Pt arrived on heparin drip, stopped on arrival due to different concentration and dial-a-flow.

## 2015-06-02 NOTE — H&P (Signed)
CC: Dizziness, LBBB on ECG  -- transferred from [facility]   HPI: 74 yo [place] man with HTN, CVA, cerebrovascular disease s/p bilateral CEA (left in Sep and right in Dec 2014), presented to [facility] ER with brief dizziness at rest. No fever, cough, N/V, CP, SOB, orthopnea, PND, edema, syncope. In the ER, ECG LBBB (new compared to 06/2013). They were also concerned for possible ST elevation and had started Heparin bolus 4000 U and then infusion, regular dose ASA. Negative troponins. BP 151/92, HR 62, 93% RA, 18 RR. Therefore he was transferred to our ER for emergent evaluation. He was symptoms free here. Able to walk around the hall without difficulty. Normal BP.    Review of Systems:  10 systems reviewed unremarkable except as noted in my HPI   Past Medical History  Diagnosis Date  . Carotid artery occlusion   . Cervicalgia   . Depressive disorder, not elsewhere classified   . BPH (benign prostatic hypertrophy)   . Pain in limb   . Unspecified transient cerebral ischemia   . Varicose veins   . Inguinal hernia without mention of obstruction or gangrene, unilateral or unspecified, (not specified as recurrent)   . Unspecified hemorrhoids without mention of complication   . Cerebrovascular disease, unspecified   . Hypertension   . Other psoriasis   . Backache, unspecified   . Stroke   . Anxiety   . Enlarged prostate   . Arthritis    No current facility-administered medications on file prior to encounter.   Current Outpatient Prescriptions on File Prior to Encounter  Medication Sig Dispense Refill  . amLODipine (NORVASC) 10 MG tablet Take 10 mg by mouth daily.    Marland Kitchen aspirin 81 MG tablet Take 81 mg by mouth daily.    Marland Kitchen atorvastatin (LIPITOR) 10 MG tablet Take 1 tablet (10 mg total) by mouth daily. 30 tablet 0  . betamethasone dipropionate (DIPROLENE) 0.05 % cream Apply 1 application topically 2 (two) times daily as needed (to affected area).     . clopidogrel  (PLAVIX) 75 MG tablet Take 75 mg by mouth daily.    . fexofenadine (ALLEGRA) 180 MG tablet Take 180 mg by mouth daily as needed for allergies.     . finasteride (PROSCAR) 5 MG tablet Take 5 mg by mouth daily.    . hydrochlorothiazide (HYDRODIURIL) 25 MG tablet Take 25 mg by mouth daily.    Marland Kitchen oxyCODONE-acetaminophen (PERCOCET/ROXICET) 5-325 MG per tablet Take 1 tablet by mouth every 6 (six) hours as needed for moderate pain or severe pain. 30 tablet 0  . oxyCODONE-acetaminophen (PERCOCET/ROXICET) 5-325 MG per tablet Take 1 tablet by mouth every 6 (six) hours as needed for moderate pain or severe pain. 30 tablet 0  . traMADol (ULTRAM) 50 MG tablet Take 1 tablet (50 mg total) by mouth every 6 (six) hours as needed for pain. 20 tablet 0     Allergies  Allergen Reactions  . Strawberry Shortness Of Breath and Swelling  . Ace Inhibitors     POSSIBLE ANGIOEDEMA  . Cardura [Doxazosin Mesylate] Other (See Comments)    Erythema  . Penicillins Rash    RASH    History   Social History  . Marital Status: Widowed    Spouse Name: N/A  . Number of Children: N/A  . Years of Education: N/A   Occupational History  . Not on file.   Social History Main Topics  . Smoking status: Never Smoker   .  Smokeless tobacco: Never Used  . Alcohol Use: No  . Drug Use: No  . Sexual Activity: Not on file   Other Topics Concern  . Not on file   Social History Narrative    Family History  Problem Relation Age of Onset  . Heart attack Father   . Cancer Daughter     PHYSICAL EXAM: There were no vitals filed for this visit. General:  Well appearing. No respiratory difficulty HEENT: normal Neck: supple. no JVD. Carotids 2+ bilat; +bruits. No lymphadenopathy or thryomegaly appreciated. Cor: PMI nondisplaced. Regular rate & rhythm. No rubs, gallops or murmurs. Lungs: clear Abdomen: soft, nontender, nondistended. No hepatosplenomegaly. No bruits or masses. Good bowel sounds. Extremities: no cyanosis,  clubbing, rash, edema Neuro: alert & oriented x 3, cranial nerves grossly intact. moves all 4 extremities w/o difficulty. Affect pleasant.  ECG: SR, LBBB   ASSESSMENT:  1. Dizziness/vertigo? - resolved  2. LBBB -- new ECG finding  - no angina or HF   3. 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

## 2015-06-02 NOTE — ED Provider Notes (Signed)
CSN: [id number]     Arrival date & time 06/02/15  0110 History   First MD Initiated Contact with Patient 06/02/15 0127     Chief Complaint  Patient presents with  . Chest Pain  . Code STEMI     (Consider location/radiation/quality/duration/timing/severity/associated sxs/prior Treatment) HPI Comments: 74 year old male with history of stroke on Plavix, high cholesterol, high blood pressure presents as possible STEMI from outside hospital. Patient initially accepted by cardiology service. Patient had mild shortness of breath and dizziness with new left bundle branch block. Patient currently has no shortness of breath, had no chest pain today. Symptoms were around 8 PM this evening. Patient received aspirin, heparin bolus. Patient had CT of the head with no acute findings or bleeding. Patient is very mild dizziness currently. No new neurologic complaints, patient has baseline mild numbness in the right leg from previous stroke. No gait changes no vision loss. No pulmonary embolism or DVT history, no recent surgery, no active cancer treatment.  Patient is a 74 y.o. male presenting with chest pain. The history is provided by the patient.  Chest Pain Associated symptoms: dizziness and shortness of breath   Associated symptoms: no abdominal pain, no back pain, no fever, no headache and not vomiting     Past Medical History  Diagnosis Date  . Carotid artery occlusion   . Cervicalgia   . Depressive disorder, not elsewhere classified   . BPH (benign prostatic hypertrophy)   . Pain in limb   . Unspecified transient cerebral ischemia   . Varicose veins   . Inguinal hernia without mention of obstruction or gangrene, unilateral or unspecified, (not specified as recurrent)   . Unspecified hemorrhoids without mention of complication   . Cerebrovascular disease, unspecified   . Hypertension   . Other psoriasis   . Backache, unspecified   . Stroke   . Anxiety   . Enlarged prostate   . Arthritis     Past Surgical History  Procedure Laterality Date  . Spine surgery  1992    Dr. Amado Coe  . Back surgery    . Hernia repair    . Vasectomy    . Colonoscopy    . Endarterectomy Left 07/14/2013    Procedure: ENDARTERECTOMY CAROTID-LEFT;  Surgeon: Serafina Mitchell, MD;  Location: [facility];  Service: Vascular;  Laterality: Left;  . Patch angioplasty Left 07/14/2013    Procedure: PATCH ANGIOPLASTY using Vascu-Guard Vascular Patch;  Surgeon: Serafina Mitchell, MD;  Location: [facility];  Service: Vascular;  Laterality: Left;  . Endarterectomy Right 10/13/2013    Procedure: RIGHT CAROTID ARTERY ENDARTERECTOMY WITH VASCU GUARD PATCH ANGIOPLASTY;  Surgeon: Serafina Mitchell, MD;  Location: [facility];  Service: Vascular;  Laterality: Right;  . Carotid endarterectomy Right 10-13-13    cea  . Carotid endarterectomy Left 07-14-13    cea   Family History  Problem Relation Age of Onset  . Heart attack Father   . Cancer Daughter    History  Substance Use Topics  . Smoking status: Never Smoker   . Smokeless tobacco: Never Used  . Alcohol Use: No    Review of Systems  Constitutional: Negative for fever and chills.  HENT: Negative for congestion.   Eyes: Negative for visual disturbance.  Respiratory: Positive for shortness of breath.   Cardiovascular: Positive for chest pain.  Gastrointestinal: Negative for vomiting and abdominal pain.  Genitourinary: Negative for dysuria and flank pain.  Musculoskeletal: Negative for back pain, neck pain and neck stiffness.  Skin: Negative for rash.  Neurological: Positive for dizziness. Negative for light-headedness and headaches.      Allergies  Strawberry; Ace inhibitors; Cardura; and Penicillins  Home Medications   Prior to Admission medications   Medication Sig Start Date End Date Taking? Authorizing Provider  amLODipine (NORVASC) 10 MG tablet Take 10 mg by mouth daily.    Historical Provider, MD  aspirin 81 MG tablet Take 81 mg by mouth daily.     Historical Provider, MD  atorvastatin (LIPITOR) 10 MG tablet Take 1 tablet (10 mg total) by mouth daily. 07/04/13   Serafina Mitchell, MD  betamethasone dipropionate (DIPROLENE) 0.05 % cream Apply 1 application topically 2 (two) times daily as needed (to affected area).     Historical Provider, MD  clopidogrel (PLAVIX) 75 MG tablet Take 75 mg by mouth daily.    Historical Provider, MD  fexofenadine (ALLEGRA) 180 MG tablet Take 180 mg by mouth daily as needed for allergies.     Historical Provider, MD  finasteride (PROSCAR) 5 MG tablet Take 5 mg by mouth daily.    Historical Provider, MD  hydrochlorothiazide (HYDRODIURIL) 25 MG tablet Take 25 mg by mouth daily.    Historical Provider, MD  oxyCODONE-acetaminophen (PERCOCET/ROXICET) 5-325 MG per tablet Take 1 tablet by mouth every 6 (six) hours as needed for moderate pain or severe pain. 10/13/13   Samantha J Rhyne, PA-C  oxyCODONE-acetaminophen (PERCOCET/ROXICET) 5-325 MG per tablet Take 1 tablet by mouth every 6 (six) hours as needed for moderate pain or severe pain. 10/14/13   Ulyses Amor, PA-C  traMADol (ULTRAM) 50 MG tablet Take 1 tablet (50 mg total) by mouth every 6 (six) hours as needed for pain. 07/18/13   Serafina Mitchell, MD   Ht 5' 8.5" (1.74 m)  Wt 208 lb (94.348 kg)  BMI 31.16 kg/m2  SpO2 93% Physical Exam  Constitutional: He is oriented to person, place, and time. He appears well-developed and well-nourished.  HENT:  Head: Normocephalic and atraumatic.  Eyes: Conjunctivae are normal. Right eye exhibits no discharge. Left eye exhibits no discharge.  Neck: Normal range of motion. Neck supple. No tracheal deviation present.  Cardiovascular: Normal rate and regular rhythm.   Pulmonary/Chest: Effort normal and breath sounds normal.  Abdominal: Soft. He exhibits no distension. There is no tenderness. There is no guarding.  Musculoskeletal: He exhibits no edema or tenderness.  Neurological: He is alert and oriented to person, place, and  time. Coordination and gait normal. GCS eye subscore is 4. GCS verbal subscore is 5. GCS motor subscore is 6.  5+ strength in UE and LE with f/e at major joints. Sensation to palpation intact in UE and LE. CNs 2-12 grossly intact.  EOMFI.  PERRL.   Finger nose and coordination intact bilateral.   Visual fields intact to finger testing. No nystagmus   Skin: Skin is warm. No rash noted.  Psychiatric: He has a normal mood and affect.  Nursing note and vitals reviewed.   ED Course  Procedures (including critical care time) Labs Review Labs Reviewed  HEPARIN LEVEL (UNFRACTIONATED)  BASIC METABOLIC PANEL  CBC  I-STAT TROPOININ, ED    Imaging Review No results found.   EKG Interpretation   Date/Time:  Saturday June 02 2015 01:14:13 EDT Ventricular Rate:  58 PR Interval:  214 QRS Duration: 156 QT Interval:  488 QTC Calculation: 479 R Axis:   -32 Text Interpretation:  Sinus rhythm Borderline prolonged PR interval Left  bundle branch block Baseline  wander in lead(s) V4 Confirmed by Daune Divirgilio  MD,  Sylvan Sookdeo ([id number]) on 06/02/2015 1:38:35 AM      MDM   Final diagnoses:  Dizziness  Dyspnea  LBBB (left bundle branch block)   Patient presents to the ER in transfer for possible STEMI. Cardiology myself assessed patient immediately in the emergency department, cardiologist canceled code STEMI, new left bundle branch block but no chest pain or current shortness of breath, patient overall well-appearing normal neuro exam area plan for observation under cardiology for further evaluation of left bundle branch block. Patient received aspirin and heparin bolus.  The patients results and plan were reviewed and discussed.   Any x-rays performed were independently reviewed by myself.   Differential diagnosis were considered with the presenting HPI.  Medications  heparin ADULT infusion 100 units/mL (25000 units/250 mL) (1,000 Units/hr Intravenous New Bag/Given 06/02/15 0132)    Filed Vitals:    06/02/15 0110 06/02/15 0121  Height:  5' 8.5" (1.74 m)  Weight:  208 lb (94.348 kg)  SpO2: 93%     Final diagnoses:  Dizziness  Dyspnea  LBBB (left bundle branch block)    Admission/ observation were discussed with the admitting physician, patient and/or family and they are comfortable with the plan.     Elnora Morrison, MD 06/02/15 [phone]

## 2015-06-02 NOTE — Progress Notes (Signed)
ANTICOAGULATION CONSULT NOTE - Initial Consult  Pharmacy Consult for Heparin  Indication: chest pain/ACS  Allergies  Allergen Reactions  . Strawberry Shortness Of Breath and Swelling  . Ace Inhibitors     POSSIBLE ANGIOEDEMA  . Cardura [Doxazosin Mesylate] Other (See Comments)    Erythema  . Penicillins Rash    RASH    Patient Measurements: Height: 5' 8.5" (174 cm) Weight: 208 lb (94.348 kg) IBW/kg (Calculated) : 69.55   Labs: Reviewed from outside hospital   Medical History: Past Medical History  Diagnosis Date  . Carotid artery occlusion   . Cervicalgia   . Depressive disorder, not elsewhere classified   . BPH (benign prostatic hypertrophy)   . Pain in limb   . Unspecified transient cerebral ischemia   . Varicose veins   . Inguinal hernia without mention of obstruction or gangrene, unilateral or unspecified, (not specified as recurrent)   . Unspecified hemorrhoids without mention of complication   . Cerebrovascular disease, unspecified   . Hypertension   . Other psoriasis   . Backache, unspecified   . Stroke   . Anxiety   . Enlarged prostate   . Arthritis     Assessment: 74 y/o M tx from [facility] as CODE STEMI (cancelled), continuing heparin per pharmacy, 4000 units BOLUS received at [facility], heparin infusing with dial-a-flow on arrival (RN unable to determine exact dose). Not on any anti-coagulants PTA.   Goal of Therapy:  Heparin level 0.3-0.7 units/ml Monitor platelets by anticoagulation protocol: Yes   Plan:  -Place pt on heparin infusion at 1000 units/hr -0800 HL -Daily CBC/HL -Monitor for bleeding  Narda Bonds 06/02/2015,1:32 AM

## 2015-06-02 NOTE — ED Notes (Signed)
MD at bedside. 

## 2015-06-02 NOTE — ED Notes (Addendum)
Verbal order given to discontinue heparin per admitting MD.

## 2015-06-03 ENCOUNTER — Other Ambulatory Visit (HOSPITAL_COMMUNITY): Payer: Medicare Other

## 2015-06-03 DIAGNOSIS — I447 Left bundle-branch block, unspecified: Secondary | ICD-10-CM | POA: Diagnosis not present

## 2015-06-03 DIAGNOSIS — R42 Dizziness and giddiness: Secondary | ICD-10-CM | POA: Diagnosis not present

## 2015-06-03 DIAGNOSIS — I6523 Occlusion and stenosis of bilateral carotid arteries: Secondary | ICD-10-CM | POA: Diagnosis not present

## 2015-06-03 DIAGNOSIS — E785 Hyperlipidemia, unspecified: Secondary | ICD-10-CM | POA: Diagnosis not present

## 2015-06-03 LAB — URINALYSIS, ROUTINE W REFLEX MICROSCOPIC
BILIRUBIN URINE: NEGATIVE
Glucose, UA: 100 mg/dL — AB
HGB URINE DIPSTICK: NEGATIVE
Ketones, ur: NEGATIVE mg/dL
LEUKOCYTES UA: NEGATIVE
Nitrite: NEGATIVE
Protein, ur: NEGATIVE mg/dL
Specific Gravity, Urine: 1.013 (ref 1.005–1.030)
Urobilinogen, UA: 0.2 mg/dL (ref 0.0–1.0)
pH: 6 (ref 5.0–8.0)

## 2015-06-03 MED ORDER — AMLODIPINE BESYLATE 5 MG PO TABS
5.0000 mg | ORAL_TABLET | Freq: Every day | ORAL | Status: DC
  Administered 2015-06-03 – 2015-06-07 (×5): 5 mg via ORAL
  Filled 2015-06-03 (×5): qty 1

## 2015-06-03 MED ORDER — HYDROCHLOROTHIAZIDE 25 MG PO TABS
12.5000 mg | ORAL_TABLET | Freq: Every day | ORAL | Status: DC
  Filled 2015-06-03: qty 0.5

## 2015-06-03 MED ORDER — HYDROCHLOROTHIAZIDE 12.5 MG PO CAPS
12.5000 mg | ORAL_CAPSULE | Freq: Every day | ORAL | Status: DC
  Administered 2015-06-03 – 2015-06-07 (×5): 12.5 mg via ORAL
  Filled 2015-06-03 (×5): qty 1

## 2015-06-03 NOTE — Progress Notes (Signed)
Patient Name: [name] Date of Encounter: 06/03/2015  Principal Problem:   LBBB (left bundle branch block) Active Problems:   Dizziness   Length of Stay:   SUBJECTIVE  Dizziness and slightly blurry vision persist, although better since admission. No angina. Poor appetite. BP higher than yesterday. Cardiac enzymes normal. No arrhythmia on telemetry. Echo not done yet.  CURRENT MEDS . amLODipine  10 mg Oral Daily  . aspirin EC  81 mg Oral Daily  . atorvastatin  40 mg Oral Daily  . enoxaparin (LOVENOX) injection  40 mg Subcutaneous Q24H  . finasteride  5 mg Oral Daily  . hydrochlorothiazide  25 mg Oral Daily    OBJECTIVE   Intake/Output Summary (Last 24 hours) at 06/03/15 0838 Last data filed at 06/03/15 0600  Gross per 24 hour  Intake    840 ml  Output    502 ml  Net    338 ml   Filed Weights   06/02/15 0121 06/02/15 0317 06/03/15 0525  Weight: 208 lb (94.348 kg) 208 lb 8 oz (94.575 kg) 206 lb 9.1 oz (93.7 kg)    PHYSICAL EXAM Filed Vitals:   06/02/15 0949 06/02/15 1400 06/02/15 2025 06/03/15 0525  BP: 96/43 133/64 132/75 115/70  Pulse: 87 61 90 66  Temp: 97.8 F (36.6 C) 97.5 F (36.4 C) 98.6 F (37 C) 98.6 F (37 C)  TempSrc: Oral Oral Tympanic Oral  Resp: 18 18 18 18   Height:      Weight:    206 lb 9.1 oz (93.7 kg)  SpO2: 97% 96% 94% 97%   General: Alert, oriented x3, no distress Head: no evidence of trauma, PERRL, EOMI, no exophtalmos or lid lag, no myxedema, no xanthelasma; normal ears, nose and oropharynx Neck: normal jugular venous pulsations and no hepatojugular reflux; brisk carotid pulses without delay and left carotid bruit Chest: clear to auscultation, no signs of consolidation by percussion or palpation, normal fremitus, symmetrical and full respiratory excursions Cardiovascular: normal position and quality of the apical impulse, regular rhythm, normal first and second heart sounds, no rubs or gallops, 2/6 aortic focus systolic  ejection murmur Abdomen: no tenderness or distention, no masses by palpation, no abnormal pulsatility or arterial bruits, normal bowel sounds, no hepatosplenomegaly Extremities: no clubbing, cyanosis or edema; 2+ radial, ulnar and brachial pulses bilaterally; 2+ right femoral, posterior tibial and dorsalis pedis pulses; 2+ left femoral, posterior tibial and dorsalis pedis pulses; no subclavian or femoral bruits Neurological: grossly nonfocal  LABS  CBC  Recent Labs  06/02/15 0140 06/02/15 0401  WBC 13.1* 11.9*  HGB 14.7 13.7  HCT 42.6 40.6  MCV 90.1 90.4  PLT 261 127   Basic Metabolic Panel  Recent Labs  06/02/15 0140 06/02/15 0401  NA 137  --   K 3.7  --   CL 102  --   CO2 23  --   GLUCOSE 126*  --   BUN 15  --   CREATININE 1.26* 1.20  CALCIUM 9.1  --    Liver Function Tests No results for input(s): AST, ALT, ALKPHOS, BILITOT, PROT, ALBUMIN in the last 72 hours. No results for input(s): LIPASE, AMYLASE in the last 72 hours. Cardiac Enzymes  Recent Labs  06/02/15 0401 06/02/15 1031 06/02/15 1540  TROPONINI <0.03 <0.03 <0.03   Fasting Lipid Panel  Recent Labs  06/02/15 0401  CHOL 101  HDL 37*  LDLCALC 42  TRIG 108  CHOLHDL 2.7   Thyroid Function Tests  Recent Labs  06/02/15 0401  TSH 2.838    Radiology Studies Imaging results have been reviewed and reportedly normal head CT at [facility]  ECG NSR, LBBB  ASSESSMENT AND PLAN Dizziness and visual changes have improved, but not resolved. Low suspicion for coronary event. New LBBB, but no high grade AV block or other arrhythmia as cause of dizziness. Relatively low BP. Normal cardiac enzymes. Excellent LDL-C level on statin  Murmur, last echo 2010 - waiting for repeat. Will also order carotid duplex (last evaluation 08/2013; ordered in 11/2013, but not completed). His symptoms remind him of stroke in 2010. With new LBBB and history of atherosclerotic vascular disease, will  need Lexiscan Myoview, but this is not urgent and could even be done as an outpatient. Reduce BP meds.   Sanda Klein, MD, [facility] [phone] office [phone] pager 06/03/2015 8:38 AM

## 2015-06-04 ENCOUNTER — Ambulatory Visit (HOSPITAL_COMMUNITY): Payer: Medicare Other

## 2015-06-04 ENCOUNTER — Observation Stay (HOSPITAL_COMMUNITY): Payer: Medicare Other

## 2015-06-04 DIAGNOSIS — I77819 Aortic ectasia, unspecified site: Secondary | ICD-10-CM | POA: Insufficient documentation

## 2015-06-04 DIAGNOSIS — I447 Left bundle-branch block, unspecified: Secondary | ICD-10-CM | POA: Diagnosis not present

## 2015-06-04 DIAGNOSIS — R079 Chest pain, unspecified: Secondary | ICD-10-CM | POA: Diagnosis not present

## 2015-06-04 DIAGNOSIS — R011 Cardiac murmur, unspecified: Secondary | ICD-10-CM

## 2015-06-04 LAB — HEMOGLOBIN A1C
Hgb A1c MFr Bld: 6.4 % — ABNORMAL HIGH (ref 4.8–5.6)
Mean Plasma Glucose: 137 mg/dL

## 2015-06-04 MED ORDER — TECHNETIUM TC 99M SESTAMIBI GENERIC - CARDIOLITE
10.0000 | Freq: Once | INTRAVENOUS | Status: AC | PRN
  Administered 2015-06-04: 10 via INTRAVENOUS

## 2015-06-04 MED ORDER — TECHNETIUM TC 99M SESTAMIBI GENERIC - CARDIOLITE
30.0000 | Freq: Once | INTRAVENOUS | Status: AC | PRN
  Administered 2015-06-04: 30 via INTRAVENOUS

## 2015-06-04 MED ORDER — REGADENOSON 0.4 MG/5ML IV SOLN
0.4000 mg | Freq: Once | INTRAVENOUS | Status: AC
  Administered 2015-06-04: 0.4 mg via INTRAVENOUS
  Filled 2015-06-04: qty 5

## 2015-06-04 MED ORDER — REGADENOSON 0.4 MG/5ML IV SOLN
INTRAVENOUS | Status: AC
  Administered 2015-06-04: 0.4 mg via INTRAVENOUS
  Filled 2015-06-04: qty 5

## 2015-06-04 NOTE — Progress Notes (Signed)
Due to patient healing from death of daughter and wife, asked patient if he wanted to speak to a chaplain, and patient said not at this time.

## 2015-06-04 NOTE — Progress Notes (Signed)
Pt with many home issue I asked Social Worker to see if he needs assistance.  I notified pt by phone that stress test without ischemia, but maybe old heart attack.  Dr. Steva Colder will see in AM for further plans and awaiting carotid dopplers.

## 2015-06-04 NOTE — Progress Notes (Signed)
Pt alert and oriented x 3. Skin warm and dry. Oral mucosa pink and moist. No SOB. Patient ambulatory. Denies pain and discomfort.  Went down for stress test this am. Recently returned to room.  Diet ordered. Patient very cooperative and friendly.

## 2015-06-04 NOTE — Progress Notes (Signed)
  Echocardiogram 2D Echocardiogram has been performed.  [name] 06/04/2015, 1:39 PM

## 2015-06-04 NOTE — Progress Notes (Signed)
Subjective: No complaints visual changes resolved.  Objective: Vital signs in last 24 hours: Temp:  [97.9 F (36.6 C)-98.4 F (36.9 C)] 97.9 F (36.6 C) (08/08 0803) Pulse Rate:  [59-96] 59 (08/08 0803) Resp:  [18-19] 18 (08/08 0803) BP: (116-134)/(60-79) 116/60 mmHg (08/08 0803) SpO2:  [94 %-98 %] 94 % (08/08 0803) Weight:  [205 lb 11.2 oz (93.305 kg)] 205 lb 11.2 oz (93.305 kg) (08/08 0521) Weight change: -13.9 oz (-0.395 kg) Last BM Date: 06/03/15 Intake/Output from previous day: -305 08/07 0701 - 08/08 0700 In: 920 [P.O.:920] Out: 1225 [Urine:1225] Intake/Output this shift:    PE: General:Pleasant affect, NAD Skin:Warm and dry, brisk capillary refill HEENT:normocephalic, sclera clear, mucus membranes moist Neck:supple, no JVD, no bruits  Heart:S1S2 RRR without murmur, gallup, rub or click Lungs:clear without rales, rhonchi, or wheezes KGU:RKYH, non tender, + BS, do not palpate liver spleen or masses Ext:no lower ext edema, 2+ pedal pulses, 2+ radial pulses Neuro:alert and oriented, MAE, follows commands, + facial symmetry Tele:SR    Lab Results:  Recent Labs  06/02/15 0140 06/02/15 0401  WBC 13.1* 11.9*  HGB 14.7 13.7  HCT 42.6 40.6  PLT 261 265   BMET  Recent Labs  06/02/15 0140 06/02/15 0401  NA 137  --   K 3.7  --   CL 102  --   CO2 23  --   GLUCOSE 126*  --   BUN 15  --   CREATININE 1.26* 1.20  CALCIUM 9.1  --     Recent Labs  06/02/15 1031 06/02/15 1540  TROPONINI <0.03 <0.03    Lab Results  Component Value Date   CHOL 101 06/02/2015   HDL 37* 06/02/2015   LDLCALC 42 06/02/2015   TRIG 108 06/02/2015   CHOLHDL 2.7 06/02/2015   Lab Results  Component Value Date   HGBA1C  06/16/2009    5.7 (NOTE) The ADA recommends the following therapeutic goal for glycemic control related to Hgb A1c measurement: Goal of therapy: <6.5 Hgb A1c  Reference: American Diabetes Association: Clinical Practice Recommendations 2010,  Diabetes Care, 2010, 33: (Suppl  1).     Lab Results  Component Value Date   TSH 2.838 06/02/2015    Hepatic Function Panel No results for input(s): PROT, ALBUMIN, AST, ALT, ALKPHOS, BILITOT, BILIDIR, IBILI in the last 72 hours.  Recent Labs  06/02/15 0401  CHOL 101   No results for input(s): PROTIME in the last 72 hours.     Studies/Results: No results found.  Medications: I have reviewed the patient's current medications. Scheduled Meds: . amLODipine  5 mg Oral Daily  . aspirin EC  81 mg Oral Daily  . atorvastatin  40 mg Oral Daily  . enoxaparin (LOVENOX) injection  40 mg Subcutaneous Q24H  . finasteride  5 mg Oral Daily  . hydrochlorothiazide  12.5 mg Oral Daily  . regadenoson      . regadenoson  0.4 mg Intravenous Once   Continuous Infusions:  PRN Meds:.acetaminophen, nitroGLYCERIN, ondansetron (ZOFRAN) IV, oxyCODONE-acetaminophen  Assessment/Plan: Dizziness and visual changes have resolved. Low suspicion for coronary event. New LBBB, but no high grade AV block or other arrhythmia as cause of dizziness. Relatively low BP. Normal cardiac enzymes. Excellent LDL-C level on statin  Murmur, last echo 2010 - waiting for repeat. Will also order carotid duplex (last evaluation 08/2013; ordered in 11/2013, but not completed). His symptoms remind him of stroke in 2010. With new LBBB and history  of atherosclerotic vascular disease, will need Lexiscan Myoview today .  BP stable.  lexiscan myoview completed without complications. nuc results to follow.    Time spent with pt. :15 minutes. [facility] R  Nurse Practitioner Certified Pager [phone] or after 5pm and on weekends call [phone] 06/04/2015, 10:32 AM Patient seen. Currently getting his echo. Denies chest pain or dyspnea. Myoview done earlier today, results pending. BP is better on lower dose of BP meds.  Possible discharge later today if studies come back in time.

## 2015-06-05 DIAGNOSIS — Z888 Allergy status to other drugs, medicaments and biological substances status: Secondary | ICD-10-CM | POA: Diagnosis not present

## 2015-06-05 DIAGNOSIS — Z79899 Other long term (current) drug therapy: Secondary | ICD-10-CM | POA: Diagnosis not present

## 2015-06-05 DIAGNOSIS — I440 Atrioventricular block, first degree: Secondary | ICD-10-CM | POA: Diagnosis present

## 2015-06-05 DIAGNOSIS — I10 Essential (primary) hypertension: Secondary | ICD-10-CM | POA: Diagnosis not present

## 2015-06-05 DIAGNOSIS — Z8673 Personal history of transient ischemic attack (TIA), and cerebral infarction without residual deficits: Secondary | ICD-10-CM | POA: Diagnosis not present

## 2015-06-05 DIAGNOSIS — Z7902 Long term (current) use of antithrombotics/antiplatelets: Secondary | ICD-10-CM | POA: Diagnosis not present

## 2015-06-05 DIAGNOSIS — M1990 Unspecified osteoarthritis, unspecified site: Secondary | ICD-10-CM | POA: Diagnosis present

## 2015-06-05 DIAGNOSIS — I2510 Atherosclerotic heart disease of native coronary artery without angina pectoris: Secondary | ICD-10-CM | POA: Diagnosis not present

## 2015-06-05 DIAGNOSIS — I679 Cerebrovascular disease, unspecified: Secondary | ICD-10-CM | POA: Diagnosis not present

## 2015-06-05 DIAGNOSIS — R42 Dizziness and giddiness: Secondary | ICD-10-CM | POA: Diagnosis present

## 2015-06-05 DIAGNOSIS — I447 Left bundle-branch block, unspecified: Secondary | ICD-10-CM | POA: Diagnosis not present

## 2015-06-05 DIAGNOSIS — Z91018 Allergy to other foods: Secondary | ICD-10-CM | POA: Diagnosis not present

## 2015-06-05 DIAGNOSIS — Z880 Allergy status to penicillin: Secondary | ICD-10-CM | POA: Diagnosis not present

## 2015-06-05 DIAGNOSIS — I25119 Atherosclerotic heart disease of native coronary artery with unspecified angina pectoris: Secondary | ICD-10-CM | POA: Diagnosis present

## 2015-06-05 DIAGNOSIS — N400 Enlarged prostate without lower urinary tract symptoms: Secondary | ICD-10-CM | POA: Diagnosis present

## 2015-06-05 DIAGNOSIS — I77819 Aortic ectasia, unspecified site: Secondary | ICD-10-CM | POA: Diagnosis present

## 2015-06-05 DIAGNOSIS — Z7982 Long term (current) use of aspirin: Secondary | ICD-10-CM | POA: Diagnosis not present

## 2015-06-05 DIAGNOSIS — Z79891 Long term (current) use of opiate analgesic: Secondary | ICD-10-CM | POA: Diagnosis not present

## 2015-06-05 DIAGNOSIS — G459 Transient cerebral ischemic attack, unspecified: Secondary | ICD-10-CM | POA: Diagnosis not present

## 2015-06-05 DIAGNOSIS — Z8249 Family history of ischemic heart disease and other diseases of the circulatory system: Secondary | ICD-10-CM | POA: Diagnosis not present

## 2015-06-05 LAB — BASIC METABOLIC PANEL
ANION GAP: 11 (ref 5–15)
BUN: 20 mg/dL (ref 6–20)
CHLORIDE: 99 mmol/L — AB (ref 101–111)
CO2: 26 mmol/L (ref 22–32)
Calcium: 9.5 mg/dL (ref 8.9–10.3)
Creatinine, Ser: 1.57 mg/dL — ABNORMAL HIGH (ref 0.61–1.24)
GFR calc non Af Amer: 41 mL/min — ABNORMAL LOW (ref >60)
GFR, EST AFRICAN AMERICAN: 48 mL/min — AB (ref >60)
GLUCOSE: 159 mg/dL — AB (ref 65–99)
Potassium: 3.7 mmol/L (ref 3.5–5.1)
Sodium: 136 mmol/L (ref 135–145)

## 2015-06-05 MED ORDER — SODIUM CHLORIDE 0.9 % IV SOLN: 250.0000 mL | INTRAVENOUS | Status: DC | PRN

## 2015-06-05 MED ORDER — SODIUM CHLORIDE 0.9 % WEIGHT BASED INFUSION: 1.0000 mL/kg/h | INTRAVENOUS | Status: DC

## 2015-06-05 MED ORDER — SODIUM CHLORIDE 0.9 % IJ SOLN: 3.0000 mL | INTRAMUSCULAR | Status: DC | PRN

## 2015-06-05 MED ORDER — SODIUM CHLORIDE 0.9 % IJ SOLN: 3.0000 mL | Freq: Two times a day (BID) | INTRAMUSCULAR | Status: DC

## 2015-06-05 MED ORDER — ASPIRIN 81 MG PO CHEW
81.0000 mg | CHEWABLE_TABLET | ORAL | Status: AC
  Administered 2015-06-06: 81 mg via ORAL
  Filled 2015-06-05: qty 1

## 2015-06-05 NOTE — Clinical Social Work Note (Signed)
Clinical Social Work Assessment  Patient Details  Name: OZIAH VITANZA MRN: [id number] Date of Birth: [birth year]  Date of referral:  06/05/15               Reason for consult:  Family Concerns, Housing Concerns/Homelessness, Grief and Loss                Permission sought to share information with:    Permission granted to share information::  No  Name::        Agency::     Relationship::     Contact Information:     Housing/Transportation Living arrangements for the past 2 months:  Single Family Home Source of Information:  Other (Comment Required) (Family members) Patient Interpreter Needed:  None Criminal Activity/Legal Involvement Pertinent to Current Situation/Hospitalization:   No Significant Relationships:  Friend, Other Family Members Lives with:  Self, Adult Children (Son Legrand Como is mentally disabled) Do you feel safe going back to the place where you live?  Yes Need for family participation in patient care:  Yes (Comment)  Care giving concerns:  Lives at home with his disabled son.  Wife died about 19 years and his daughter recently died. He has been caring for his son since then.  His health has deteriorated.  Social Worker assessment / plan:  CSW was notified by nursing staff that patient's family wanted a Education officer, museum to be in patient's room when the gave him some "bad news".  Family was already worried about his increased depression and feel that he has not been taking care of himself.  Family presented patient with multiple pieces of bad news. The first was that the family has arranged for his son Legrand Como to be placed in a group home where he can receive around the clock support and care.  The second issue per family was that patient is being evicted from his home because he has not paid his rent.  Family state that the landlord has already started eviction proceedings.  Patient became quite overwhelmed with both pieces of information and it showed in his facial  expressions and mannerisms.  Patient admitted that he is 5 months behind on his rent but he is trying to get things caught up as he has not been able to work lately due to medical issues.  Patients that even though he's on disability- he has continued to work at least 1 job, sometimes 2. He does not like to remain idle.  He feels that his finances have gotten behind because he is trying to keep payments up on his daughter's car that he kept after she passed away. He feel that a vehicle is vital to his continued independence.  Patient contacted his landlord during the assessment and related that his landlord said he could remain in the house as he tried to get the rent "caught up".  Family is concerned that the landlord had told them that he would have to move.  Patient would like to move into a Section 8 housing unit but states that the waiting list if very long. Family in room deny that he can come live with any of the family nor do they have funds to help him catch up his rest which is over $525.00 a month (X's 5 months).    Patient also verbalized that he was upset about his son Legrand Como having to move to a group home but after a great deal of discussion- he agreed that it would be better for  him to be in an environment where he receive meals, medicines and also be more socially active.      Employment status:  Disabled (Comment on whether or not currently receiving Disability), Retired, Systems developer information:  Managed Medicare PT Recommendations:   No assessed at this time. Information / Referral to community resources:   None at this time  Patient/Family's Response to care: Family is very worried about patient being at home and do not feel that he is able to manage his self care anymore. They feel that his depression is significant and that he has not been paying his bills in many months.  Patient denies this and states that while he has gotten behind on bills- he remains self sufficient of  his ADL's and has been managing well at home.  He states that he is clear minded and that he "takes care of himself.".    Patient/Family's Understanding of and Emotional Response to Diagnosis, Current Treatment, and Prognosis:  Patient became visibly upset towards the end of the meeting with CSW and family and was rubbing his face and head.  He stated "I have a lot to think about and I need some time to do this.  I just need to think and be quiet."  CSW provided support and reassurance but patient stated "I know you mean well but you can't help me with anything."  Family in room did not appear to be able to offer support or help of any kind even though they have a good understanding of his current financial and emotional issues.   CSW will revisit patient and provide further support - want to give him time to process all the information that he was given today. Emotional Assessment Appearance:  Appears stated age Attitude/Demeanor/Rapport:  Avoidant, Grieving, Apprehensive, Guarded Affect (typically observed):  Frustrated, Grieving, Blunt, Guarded, Anxious Orientation:  Oriented to Self, Oriented to Place, Oriented to  Time, Oriented to Situation Alcohol / Substance use:  Never Used Psych involvement (Current and /or in the community):  No (Comment)  Discharge Needs  Concerns to be addressed:  Grief and Loss Concerns, Financial / Insurance Concerns, Coping/Stress Concerns, Care Coordination Readmission within the last 30 days:  No Current discharge risk:  Lives alone, Inadequate Financial Supports Barriers to Discharge:  Continued Medical Work up   Kendell Bane T, LCSW 06/05/2015, 10:22 PM

## 2015-06-05 NOTE — Evaluation (Signed)
Physical Therapy Evaluation Patient Details Name: [name] MRN: [id number] DOB: Nov 30, 1940 Today's Date: 06/05/2015   History of Present Illness  74 yo [place] man with HTN, CVA, cerebrovascular disease s/p bilateral CEA (left in Sep and right in Dec 2014), presented to [facility] ER with brief dizziness at rest.  Clinical Impression  Pt admitted with above diagnosis. Pt currently with functional limitations due to the deficits listed below (see PT Problem List).  Pt will benefit from skilled PT to increase their independence and safety with mobility to allow discharge to the venue listed below.  Pt with minimal c/o dizziness in sitting, but no sustained dizziness. No reports of dizziness in standing.  See below for orthostatics.  Feel pt should progress well in acute care and no follow up post acute care needed.     Follow Up Recommendations No PT follow up    Equipment Recommendations  None recommended by PT    Recommendations for Other Services       Precautions / Restrictions Precautions Precautions: None      Mobility  Bed Mobility Overal bed mobility: Independent             General bed mobility comments: pt reported brief periods of dizziness in sitting, but not sustained  Transfers Overall transfer level: Independent Equipment used: None             General transfer comment: no dizziness noted upon standing  Ambulation/Gait Ambulation/Gait assistance: Supervision;Min guard Ambulation Distance (Feet): 220 Feet Assistive device: None Gait Pattern/deviations: Step-through pattern     General Gait Details: Pt ambulates with step through pattern with decreased reciprocal arm swing and stiff gait  Stairs            Wheelchair Mobility    Modified Rankin (Stroke Patients Only)       Balance Overall balance assessment: Needs assistance   Sitting balance-Leahy Scale: Good       Standing balance-Leahy Scale: Good                                Pertinent Vitals/Pain Pain Assessment: No/denies pain Supine: BP 108/76 HR 72 Sitting: BP 99/79 HR 82 Standing: BP 111/69 HR 99 Standing after 3 minutes: 114/76 HR 107    Home Living Family/patient expects to be discharged to:: Private residence Living Arrangements: Children (disabled son who will be going to a group home at end of month) Available Help at Discharge: Neighbor;Available PRN/intermittently;Family Type of Home: House Home Access: Stairs to enter;Level entry     Home Layout: One level Home Equipment: Cane - single point      Prior Function Level of Independence: Independent               Hand Dominance        Extremity/Trunk Assessment   Upper Extremity Assessment: Overall WFL for tasks assessed           Lower Extremity Assessment: Overall WFL for tasks assessed      Cervical / Trunk Assessment: Normal  Communication   Communication: No difficulties  Cognition Arousal/Alertness: Awake/alert Behavior During Therapy: WFL for tasks assessed/performed (appears depressed though due to daughter's death) Overall Cognitive Status: Within Functional Limits for tasks assessed                      General Comments General comments (skin integrity, edema, etc.): Pt became tearful discussing daughter's  death, at age 71, which occured within the last year.  Pt very talkative and wanting PT to stay and chat.  Offered chaplain services for conversation, but said he would think about it.    Exercises        Assessment/Plan    PT Assessment Patient needs continued PT services  PT Diagnosis Difficulty walking   PT Problem List Decreased activity tolerance;Decreased balance;Decreased mobility  PT Treatment Interventions Gait training;Functional mobility training;Therapeutic activities;Therapeutic exercise;Balance training   PT Goals (Current goals can be found in the Care Plan section) Acute Rehab PT Goals Patient Stated  Goal: none stated PT Goal Formulation: With patient Time For Goal Achievement: 06/19/15 Potential to Achieve Goals: Good    Frequency Min 3X/week   Barriers to discharge Decreased caregiver support      Co-evaluation               End of Session Equipment Utilized During Treatment: Gait belt Activity Tolerance: Patient tolerated treatment well Patient left: in chair;with call bell/phone within reach      Functional Assessment Tool Used: clinical judgement and objective findings. Functional Limitation: Mobility: Walking and moving around Mobility: Walking and Moving Around Current Status [phone]): At least 1 percent but less than 20 percent impaired, limited or restricted Mobility: Walking and Moving Around Goal Status [phone]): 0 percent impaired, limited or restricted    Time: 3382-5053 PT Time Calculation (min) (ACUTE ONLY): 26 min   Charges:   PT Evaluation $Initial PT Evaluation Tier I: 1 Procedure PT Treatments $Gait Training: 8-22 mins   PT G Codes:   PT G-Codes **NOT FOR INPATIENT CLASS** Functional Assessment Tool Used: clinical judgement and objective findings. Functional Limitation: Mobility: Walking and moving around Mobility: Walking and Moving Around Current Status [phone]): At least 1 percent but less than 20 percent impaired, limited or restricted Mobility: Walking and Moving Around Goal Status [phone]): 0 percent impaired, limited or restricted    Chandelle Harkey LUBECK 06/05/2015, 1:53 PM

## 2015-06-05 NOTE — Progress Notes (Signed)
Patient Name: [name] Date of Encounter: 06/05/2015   SUBJECTIVE  No chest pain or SOB. Felt dizzy while walking to bathroom and laying on bed yesterday, now resolved.   CURRENT MEDS . amLODipine  5 mg Oral Daily  . aspirin EC  81 mg Oral Daily  . atorvastatin  40 mg Oral Daily  . enoxaparin (LOVENOX) injection  40 mg Subcutaneous Q24H  . finasteride  5 mg Oral Daily  . hydrochlorothiazide  12.5 mg Oral Daily    OBJECTIVE  Filed Vitals:   06/04/15 1415 06/04/15 2100 06/05/15 0655 06/05/15 0852  BP: 136/75 124/79 120/70 126/80  Pulse: 60 63 61 70  Temp: 98.3 F (36.8 C) 97.8 F (36.6 C) 97.5 F (36.4 C) 97.5 F (36.4 C)  TempSrc: Oral Oral Oral Oral  Resp: 16 18 19 20   Height:      Weight:   204 lb 8 oz (92.761 kg)   SpO2: 94% 98% 96% 95%    Intake/Output Summary (Last 24 hours) at 06/05/15 1128 Last data filed at 06/05/15 0930  Gross per 24 hour  Intake    600 ml  Output    201 ml  Net    399 ml   Filed Weights   06/03/15 0525 06/04/15 0521 06/05/15 0655  Weight: 206 lb 9.1 oz (93.7 kg) 205 lb 11.2 oz (93.305 kg) 204 lb 8 oz (92.761 kg)    PHYSICAL EXAM  General: Pleasant, NAD. Neuro: Alert and oriented X 3. Moves all extremities spontaneously. Psych: Normal affect. HEENT:  Normal  Neck: Supple without bruits or JVD. Lungs:  Resp regular and unlabored, CTA. Heart: RRR no s3, s4, or murmurs. Abdomen: Soft, non-tender, non-distended, BS + x 4.  Extremities: No clubbing, cyanosis or edema. DP/PT/Radials 2+ and equal bilaterally.   Accessory Clinical Findings Cardiac Enzymes  Recent Labs  06/02/15 1540  TROPONINI <0.03    TELE  SR with LBBB and 1st degree AV block  Radiology/Studies  Nm Myocar Multi W/spect W/wall Motion / Ef  06/04/2015   CLINICAL DATA:  Chest pain  EXAM: MYOCARDIAL IMAGING WITH SPECT (REST AND PHARMACOLOGIC-STRESS)  GATED LEFT VENTRICULAR WALL MOTION STUDY  LEFT VENTRICULAR EJECTION FRACTION  TECHNIQUE: Standard  myocardial SPECT imaging was performed after resting intravenous injection of 10 mCi Tc-58m sestamibi. Subsequently, intravenous infusion of Lexiscan was performed under the supervision of the Cardiology staff. At peak effect of the drug, 30 mCi Tc-37m sestamibi was injected intravenously and standard myocardial SPECT imaging was performed. Quantitative gated imaging was also performed to evaluate left ventricular wall motion, and estimate left ventricular ejection fraction.  COMPARISON:  None.  FINDINGS: Perfusion: Large fixed perfusion defect involving the inferior wall extending from the base to the apex. The apex is perfused. There is no stress-induced perfusion defect.  Wall Motion: Normal left ventricular wall motion. No left ventricular dilation.  Left Ventricular Ejection Fraction: 59 %  End diastolic volume 78 ml  End systolic volume 32 ml  IMPRESSION: 1. Fixed defect involves the inferior wall as described. No stress-induced ischemia.  2. Normal left ventricular wall motion.  3. Left ventricular ejection fraction 59%  4. Low-risk stress test findings*.  *2012 Appropriate Use Criteria for Coronary Revascularization Focused Update: J Am Coll Cardiol. 2993;71(6):967-893. http://content.[email or web address].aspx?articleid=[email or web address]   Electronically Signed   By: Marybelle Killings [name]   On: 06/04/2015 14:20   Echo 06/04/15 LV EF: 50% -  55%  ------------------------------------------------------------------- Indications:   Murmur 785.2.  ------------------------------------------------------------------- History:  PMH:  LBBB. Angina pectoris.  ------------------------------------------------------------------- Study Conclusions  - Left ventricle: The cavity size was normal. There was moderate focal basal hypertrophy of the septum. Systolic function was normal. The estimated ejection fraction was in the range of 50% to 55%. There is hypokinesis of the mid-apical anteroseptal myocardium.  LBBB noted. Doppler parameters are consistent with abnormal left ventricular relaxation (grade 1 diastolic dysfunction). - Aorta: Aortic root dimension: 38 mm (ED). - Ascending aorta: The ascending aorta was mildly dilated.  MYOCARDIAL IMAGING WITH SPECT (REST AND PHARMACOLOGIC-STRESS) 06/04/15 FINDINGS: Perfusion: Large fixed perfusion defect involving the inferior wall extending from the base to the apex. The apex is perfused. There is no stress-induced perfusion defect.  Wall Motion: Normal left ventricular wall motion. No left ventricular dilation.  Left Ventricular Ejection Fraction: 59 %  End diastolic volume 78 ml  End systolic volume 32 ml  IMPRESSION: 1. Fixed defect involves the inferior wall as described. No stress-induced ischemia.  2. Normal left ventricular wall motion.  3. Left ventricular ejection fraction 59%  4. Low-risk stress test findings*.  ASSESSMENT AND PLAN    LBBB (left bundle branch block)   Dizziness   - Echo showed LE EF of 50-55%, hypokinesis of the mid-apical anteroseptal myocardium, grade 1 DD, aortic root dimension 51mm, mildly dilated ascending aorta. Will discuss with MD for further plan.  - Lexiscan showed large fixed perfusion defect involving the inferior wall extending from the base to the apex. The apex is perfused. There is no stress-induced perfusion defect. Low risk Myoviw.  - Top x 3 negative, BNP 36, LDL 42. HgbA1C 6.4, TSH noraml - Pending Carotid US (last evaluation 08/2013) - Continue ASA, Norvasc, Lipitor and HCTZ  - Will get PT eval for stability and gait. He did felt dizzy early morning while walking. BP stable and well controlled. Will get orthostatic vitals.   Signed, Leanor Kail PA-C Pager [phone]  Above findings discussed with patient. He has dyspnea and  occasional chest pressure. Has LBBB. Myoview shows inferior wall scar. Carotid dopplers pending. Will plan for left heart cath for tomorrow.  Discussed with patient.

## 2015-06-06 ENCOUNTER — Inpatient Hospital Stay (INDEPENDENT_AMBULATORY_CARE_PROVIDER_SITE_OTHER): Payer: Medicare Other

## 2015-06-06 ENCOUNTER — Encounter (HOSPITAL_COMMUNITY)
Admission: EM | Disposition: A | Discharge status: Home / Self Care | Payer: Medicare Other | Source: Home / Self Care | Attending: Cardiology

## 2015-06-06 DIAGNOSIS — G459 Transient cerebral ischemic attack, unspecified: Secondary | ICD-10-CM | POA: Diagnosis not present

## 2015-06-06 DIAGNOSIS — I2510 Atherosclerotic heart disease of native coronary artery without angina pectoris: Secondary | ICD-10-CM

## 2015-06-06 HISTORY — PX: CARDIAC CATHETERIZATION: SHX172

## 2015-06-06 LAB — PROTIME-INR
INR: 1.07 (ref 0.00–1.49)
Prothrombin Time: 14.1 seconds (ref 11.6–15.2)

## 2015-06-06 SURGERY — LEFT HEART CATH AND CORONARY ANGIOGRAPHY
Anesthesia: LOCAL

## 2015-06-06 MED ORDER — SODIUM CHLORIDE 0.9 % WEIGHT BASED INFUSION
3.0000 mL/kg/h | INTRAVENOUS | Status: AC
  Administered 2015-06-06: 3 mL/kg/h via INTRAVENOUS

## 2015-06-06 MED ORDER — NITROGLYCERIN 1 MG/10 ML FOR IR/CATH LAB
INTRA_ARTERIAL | Status: AC
  Filled 2015-06-06: qty 10

## 2015-06-06 MED ORDER — SODIUM CHLORIDE 0.9 % IV SOLN: 250.0000 mL | INTRAVENOUS | Status: DC | PRN

## 2015-06-06 MED ORDER — LIDOCAINE HCL (PF) 1 % IJ SOLN
INTRAMUSCULAR | Status: DC | PRN
  Administered 2015-06-06: 12:00:00

## 2015-06-06 MED ORDER — VERAPAMIL HCL 2.5 MG/ML IV SOLN
INTRAVENOUS | Status: AC
  Filled 2015-06-06: qty 2

## 2015-06-06 MED ORDER — SODIUM CHLORIDE 0.9 % IJ SOLN: 3.0000 mL | INTRAMUSCULAR | Status: DC | PRN

## 2015-06-06 MED ORDER — FENTANYL CITRATE (PF) 100 MCG/2ML IJ SOLN
INTRAMUSCULAR | Status: DC | PRN
  Administered 2015-06-06: 25 ug via INTRAVENOUS

## 2015-06-06 MED ORDER — IOHEXOL 350 MG/ML SOLN
INTRAVENOUS | Status: DC | PRN
Start: 2015-06-06 — End: 2015-06-06
  Administered 2015-06-06: 60 mL via INTRA_ARTERIAL

## 2015-06-06 MED ORDER — FENTANYL CITRATE (PF) 100 MCG/2ML IJ SOLN
INTRAMUSCULAR | Status: AC
  Filled 2015-06-06: qty 4

## 2015-06-06 MED ORDER — MIDAZOLAM HCL 2 MG/2ML IJ SOLN
INTRAMUSCULAR | Status: AC
  Filled 2015-06-06: qty 4

## 2015-06-06 MED ORDER — HEPARIN (PORCINE) IN NACL 2-0.9 UNIT/ML-% IJ SOLN
INTRAMUSCULAR | Status: AC
  Filled 2015-06-06: qty 1000

## 2015-06-06 MED ORDER — SODIUM CHLORIDE 0.9 % IJ SOLN
3.0000 mL | Freq: Two times a day (BID) | INTRAMUSCULAR | Status: DC
  Administered 2015-06-06 – 2015-06-07 (×2): 3 mL via INTRAVENOUS

## 2015-06-06 MED ORDER — LIDOCAINE HCL (PF) 1 % IJ SOLN
INTRAMUSCULAR | Status: AC
  Filled 2015-06-06: qty 30

## 2015-06-06 MED ORDER — MIDAZOLAM HCL 2 MG/2ML IJ SOLN
INTRAMUSCULAR | Status: DC | PRN
  Administered 2015-06-06: 1 mg via INTRAVENOUS

## 2015-06-06 MED ORDER — HEPARIN SODIUM (PORCINE) 1000 UNIT/ML IJ SOLN
INTRAMUSCULAR | Status: DC | PRN
  Administered 2015-06-06: 5000 [IU] via INTRAVENOUS

## 2015-06-06 SURGICAL SUPPLY — 11 items
CATH INFINITI 5 FR JL3.5 (CATHETERS) ×1 IMPLANT
CATH INFINITI 5FR ANG PIGTAIL (CATHETERS) ×2 IMPLANT
CATH OPTITORQUE JACKY 4.0 5F (CATHETERS) ×2 IMPLANT
DEVICE RAD COMP TR BAND LRG (VASCULAR PRODUCTS) ×2 IMPLANT
GLIDESHEATH SLEND SS 6F .021 (SHEATH) ×2 IMPLANT
KIT HEART LEFT (KITS) ×2 IMPLANT
PACK CARDIAC CATHETERIZATION (CUSTOM PROCEDURE TRAY) ×2 IMPLANT
TRANSDUCER W/STOPCOCK (MISCELLANEOUS) ×2 IMPLANT
TUBING CIL FLEX 10 FLL-RA (TUBING) ×2 IMPLANT
WIRE HITORQ VERSACORE ST 145CM (WIRE) ×1 IMPLANT
WIRE SAFE-T 1.5MM-J .035X260CM (WIRE) ×2 IMPLANT

## 2015-06-06 NOTE — Progress Notes (Signed)
*  PRELIMINARY RESULTS* Vascular Ultrasound Carotid Duplex (Doppler) has been completed. Findings suggest 1-39% right internal carotid artery stenosis and 40-59% distal left internal carotid artery stenosis bilaterally. Vertebral arteries are patent with antegrade flow.  06/06/2015 10:42 AM Maudry Mayhew, RVT, RDCS, RDMS

## 2015-06-06 NOTE — H&P (View-Only) (Signed)
Patient Name: [name] Date of Encounter: 06/06/2015     Principal Problem:   LBBB (left bundle branch block) Active Problems:   Dizziness    SUBJECTIVE  Patient noted dizziness walking to bathroom earlier today.  Has had only mild chest tightness. Telemetry shows NSR with PACs.   CURRENT MEDS . amLODipine  5 mg Oral Daily  . aspirin EC  81 mg Oral Daily  . atorvastatin  40 mg Oral Daily  . enoxaparin (LOVENOX) injection  40 mg Subcutaneous Q24H  . finasteride  5 mg Oral Daily  . hydrochlorothiazide  12.5 mg Oral Daily  . sodium chloride  3 mL Intravenous Q12H    OBJECTIVE  Filed Vitals:   06/05/15 1528 06/05/15 2146 06/06/15 0526 06/06/15 0845  BP: 122/82 132/87 121/52 131/72  Pulse: 91 68 76 65  Temp: 98.4 F (36.9 C) 98 F (36.7 C) 97.6 F (36.4 C) 97.5 F (36.4 C)  TempSrc: Oral Oral Oral Oral  Resp: 18 20 18 18   Height:      Weight:   206 lb (93.441 kg)   SpO2: 98% 97% 98% 94%    Intake/Output Summary (Last 24 hours) at 06/06/15 0924 Last data filed at 06/06/15 0730  Gross per 24 hour  Intake    480 ml  Output      1 ml  Net    479 ml   Filed Weights   06/04/15 0521 06/05/15 0655 06/06/15 0526  Weight: 205 lb 11.2 oz (93.305 kg) 204 lb 8 oz (92.761 kg) 206 lb (93.441 kg)    PHYSICAL EXAM  General: Pleasant, NAD. Neuro: Alert and oriented X 3. Moves all extremities spontaneously. Psych: Normal affect. HEENT:  Normal  Neck: Supple without bruits or JVD. Lungs:  Resp regular and unlabored, CTA. Heart: RRR no s3, s4, or murmurs. Abdomen: Soft, non-tender, non-distended, BS + x 4.  Extremities: No clubbing, cyanosis or edema. DP/PT/Radials 2+ and equal bilaterally.  Accessory Clinical Findings  CBC No results for input(s): WBC, NEUTROABS, HGB, HCT, MCV, PLT in the last 72 hours. Basic Metabolic Panel  Recent Labs  06/05/15 1637  NA 136  K 3.7  CL 99*  CO2 26  GLUCOSE 159*  BUN 20  CREATININE 1.57*  CALCIUM 9.5   Liver  Function Tests No results for input(s): AST, ALT, ALKPHOS, BILITOT, PROT, ALBUMIN in the last 72 hours. No results for input(s): LIPASE, AMYLASE in the last 72 hours. Cardiac Enzymes No results for input(s): CKTOTAL, CKMB, CKMBINDEX, TROPONINI in the last 72 hours. BNP Invalid input(s): POCBNP D-Dimer No results for input(s): DDIMER in the last 72 hours. Hemoglobin A1C No results for input(s): HGBA1C in the last 72 hours. Fasting Lipid Panel No results for input(s): CHOL, HDL, LDLCALC, TRIG, CHOLHDL, LDLDIRECT in the last 72 hours. Thyroid Function Tests No results for input(s): TSH, T4TOTAL, T3FREE, THYROIDAB in the last 72 hours.  Invalid input(s): FREET3  TELE  NSR with PACs  ECG    Radiology/Studies  Nm Myocar Multi W/spect W/wall Motion / Ef  06/04/2015   CLINICAL DATA:  Chest pain  EXAM: MYOCARDIAL IMAGING WITH SPECT (REST AND PHARMACOLOGIC-STRESS)  GATED LEFT VENTRICULAR WALL MOTION STUDY  LEFT VENTRICULAR EJECTION FRACTION  TECHNIQUE: Standard myocardial SPECT imaging was performed after resting intravenous injection of 10 mCi Tc-61m sestamibi. Subsequently, intravenous infusion of Lexiscan was performed under the supervision of the Cardiology staff. At peak effect of the drug, 30 mCi Tc-97m sestamibi was injected intravenously and standard  myocardial SPECT imaging was performed. Quantitative gated imaging was also performed to evaluate left ventricular wall motion, and estimate left ventricular ejection fraction.  COMPARISON:  None.  FINDINGS: Perfusion: Large fixed perfusion defect involving the inferior wall extending from the base to the apex. The apex is perfused. There is no stress-induced perfusion defect.  Wall Motion: Normal left ventricular wall motion. No left ventricular dilation.  Left Ventricular Ejection Fraction: 59 %  End diastolic volume 78 ml  End systolic volume 32 ml  IMPRESSION: 1. Fixed defect involves the inferior wall as described. No stress-induced  ischemia.  2. Normal left ventricular wall motion.  3. Left ventricular ejection fraction 59%  4. Low-risk stress test findings*.  *2012 Appropriate Use Criteria for Coronary Revascularization Focused Update: J Am Coll Cardiol. 0211;15(5):208-022. http://content.[email or web address].aspx?articleid=1201161   Electronically Signed   By: Marybelle Killings [name]   On: 06/04/2015 14:20    ASSESSMENT AND PLAN  1. Abnormal Myoview. 2. Dizziness. 3. LBBB  Plan: I reordered the carotid doppler with vascular lab.  The first order had gone to radiology. Left heart cath today. Possibly home tomorrow if stable.  Signed, Warren Danes MD

## 2015-06-06 NOTE — Progress Notes (Signed)
TR band removed at 1550. A 2x2 guaze with tegaderm was applied. Site remains level 0. R pulses remain 2+, extremity is warm to touch, normal color for ethnicity. Vital signs stable. Pt instructed to not put any pressure or use arm. Pt instructed to call if arm begins to bleed. Pt verbalized understanding. Will continue to monitor pt.  Maurene Capes RN

## 2015-06-06 NOTE — Interval H&P Note (Signed)
History and Physical Interval Note:  06/06/2015 11:29 AM  [name]  has presented today for surgery, with the diagnosis of abnormal nuclear stress test  The various methods of treatment have been discussed with the patient and family. After consideration of risks, benefits and other options for treatment, the patient has consented to  Procedure(s): Left Heart Cath and Coronary Angiography (N/A) as a surgical intervention .  The patient's history has been reviewed, patient examined, no change in status, stable for surgery.  I have reviewed the patient's chart and labs.  Questions were answered to the patient's satisfaction.     Kathlyn Sacramento

## 2015-06-06 NOTE — Progress Notes (Signed)
Patient Name: [name] Date of Encounter: 06/06/2015     Principal Problem:   LBBB (left bundle branch block) Active Problems:   Dizziness    SUBJECTIVE  Patient noted dizziness walking to bathroom earlier today.  Has had only mild chest tightness. Telemetry shows NSR with PACs.   CURRENT MEDS . amLODipine  5 mg Oral Daily  . aspirin EC  81 mg Oral Daily  . atorvastatin  40 mg Oral Daily  . enoxaparin (LOVENOX) injection  40 mg Subcutaneous Q24H  . finasteride  5 mg Oral Daily  . hydrochlorothiazide  12.5 mg Oral Daily  . sodium chloride  3 mL Intravenous Q12H    OBJECTIVE  Filed Vitals:   06/05/15 1528 06/05/15 2146 06/06/15 0526 06/06/15 0845  BP: 122/82 132/87 121/52 131/72  Pulse: 91 68 76 65  Temp: 98.4 F (36.9 C) 98 F (36.7 C) 97.6 F (36.4 C) 97.5 F (36.4 C)  TempSrc: Oral Oral Oral Oral  Resp: 18 20 18 18   Height:      Weight:   206 lb (93.441 kg)   SpO2: 98% 97% 98% 94%    Intake/Output Summary (Last 24 hours) at 06/06/15 0924 Last data filed at 06/06/15 0730  Gross per 24 hour  Intake    480 ml  Output      1 ml  Net    479 ml   Filed Weights   06/04/15 0521 06/05/15 0655 06/06/15 0526  Weight: 205 lb 11.2 oz (93.305 kg) 204 lb 8 oz (92.761 kg) 206 lb (93.441 kg)    PHYSICAL EXAM  General: Pleasant, NAD. Neuro: Alert and oriented X 3. Moves all extremities spontaneously. Psych: Normal affect. HEENT:  Normal  Neck: Supple without bruits or JVD. Lungs:  Resp regular and unlabored, CTA. Heart: RRR no s3, s4, or murmurs. Abdomen: Soft, non-tender, non-distended, BS + x 4.  Extremities: No clubbing, cyanosis or edema. DP/PT/Radials 2+ and equal bilaterally.  Accessory Clinical Findings  CBC No results for input(s): WBC, NEUTROABS, HGB, HCT, MCV, PLT in the last 72 hours. Basic Metabolic Panel  Recent Labs  06/05/15 1637  NA 136  K 3.7  CL 99*  CO2 26  GLUCOSE 159*  BUN 20  CREATININE 1.57*  CALCIUM 9.5   Liver  Function Tests No results for input(s): AST, ALT, ALKPHOS, BILITOT, PROT, ALBUMIN in the last 72 hours. No results for input(s): LIPASE, AMYLASE in the last 72 hours. Cardiac Enzymes No results for input(s): CKTOTAL, CKMB, CKMBINDEX, TROPONINI in the last 72 hours. BNP Invalid input(s): POCBNP D-Dimer No results for input(s): DDIMER in the last 72 hours. Hemoglobin A1C No results for input(s): HGBA1C in the last 72 hours. Fasting Lipid Panel No results for input(s): CHOL, HDL, LDLCALC, TRIG, CHOLHDL, LDLDIRECT in the last 72 hours. Thyroid Function Tests No results for input(s): TSH, T4TOTAL, T3FREE, THYROIDAB in the last 72 hours.  Invalid input(s): FREET3  TELE  NSR with PACs  ECG    Radiology/Studies  Nm Myocar Multi W/spect W/wall Motion / Ef  06/04/2015   CLINICAL DATA:  Chest pain  EXAM: MYOCARDIAL IMAGING WITH SPECT (REST AND PHARMACOLOGIC-STRESS)  GATED LEFT VENTRICULAR WALL MOTION STUDY  LEFT VENTRICULAR EJECTION FRACTION  TECHNIQUE: Standard myocardial SPECT imaging was performed after resting intravenous injection of 10 mCi Tc-56m sestamibi. Subsequently, intravenous infusion of Lexiscan was performed under the supervision of the Cardiology staff. At peak effect of the drug, 30 mCi Tc-50m sestamibi was injected intravenously and standard  myocardial SPECT imaging was performed. Quantitative gated imaging was also performed to evaluate left ventricular wall motion, and estimate left ventricular ejection fraction.  COMPARISON:  None.  FINDINGS: Perfusion: Large fixed perfusion defect involving the inferior wall extending from the base to the apex. The apex is perfused. There is no stress-induced perfusion defect.  Wall Motion: Normal left ventricular wall motion. No left ventricular dilation.  Left Ventricular Ejection Fraction: 59 %  End diastolic volume 78 ml  End systolic volume 32 ml  IMPRESSION: 1. Fixed defect involves the inferior wall as described. No stress-induced  ischemia.  2. Normal left ventricular wall motion.  3. Left ventricular ejection fraction 59%  4. Low-risk stress test findings*.  *2012 Appropriate Use Criteria for Coronary Revascularization Focused Update: J Am Coll Cardiol. 4782;95(6):213-086. http://content.airportbarriers.com.aspx?articleid=1201161   Electronically Signed   By: Marybelle Killings [name]   On: 06/04/2015 14:20    ASSESSMENT AND PLAN  1. Abnormal Myoview. 2. Dizziness. 3. LBBB  Plan: I reordered the carotid doppler with vascular lab.  The first order had gone to radiology. Left heart cath today. Possibly home tomorrow if stable.  Signed, Warren Danes MD

## 2015-06-07 ENCOUNTER — Encounter (HOSPITAL_COMMUNITY): Payer: Self-pay | Admitting: Cardiovascular Disease

## 2015-06-07 MED ORDER — ATORVASTATIN CALCIUM 40 MG PO TABS
40.0000 mg | ORAL_TABLET | Freq: Every day | ORAL | Status: DC
Start: 2015-06-07 — End: 2016-08-01

## 2015-06-07 MED FILL — Nitroglycerin IV Soln 100 MCG/ML in D5W: INTRA_ARTERIAL | Qty: 10 | Status: AC

## 2015-06-07 NOTE — Discharge Summary (Signed)
Discharge Summary   Patient ID: [name],  MRN: [id number], DOB/AGE: 76-04-1941 76 y.o.  Admit date: 06/02/2015 Discharge date: 06/07/2015  Primary Care Provider: [facility] Primary Cardiologist: New, He requested to follow by cardiology in [place].  Discharge Diagnoses Principal Problem:   LBBB (left bundle branch block) Active Problems:   Dizziness   Allergies Allergies  Allergen Reactions  . Strawberry Shortness Of Breath and Swelling  . Ace Inhibitors     POSSIBLE ANGIOEDEMA  . Cardura [Doxazosin Mesylate] Other (See Comments)    Erythema  . Penicillins Rash    RASH    Procedures  Echo 06/04/15 LV EF: 50% -  55%  ------------------------------------------------------------------- Indications:   Murmur 785.2.  ------------------------------------------------------------------- History:  PMH: LBBB. Angina pectoris.  ------------------------------------------------------------------- Study Conclusions  - Left ventricle: The cavity size was normal. There was moderate focal basal hypertrophy of the septum. Systolic function was normal. The estimated ejection fraction was in the range of 50% to 55%. There is hypokinesis of the mid-apical anteroseptal myocardium. LBBB noted. Doppler parameters are consistent with abnormal left ventricular relaxation (grade 1 diastolic dysfunction). - Aorta: Aortic root dimension: 38 mm (ED). - Ascending aorta: The ascending aorta was mildly dilated.  MYOCARDIAL IMAGING WITH SPECT (REST AND PHARMACOLOGIC-STRESS) 06/04/15 FINDINGS: Perfusion: Large fixed perfusion defect involving the inferior wall extending from the base to the apex. The apex is perfused. There is no stress-induced perfusion defect.  Wall Motion: Normal left ventricular wall motion. No left ventricular dilation.  Left Ventricular Ejection Fraction: 59 %  End diastolic volume 78 ml  End systolic volume 32 ml  IMPRESSION: 1.  Fixed defect involves the inferior wall as described. No stress-induced ischemia.  2. Normal left ventricular wall motion.  3. Left ventricular ejection fraction 59%  4. Low-risk stress test findings*.   Cath 06/06/15  Cath results :   Prox RCA-1 lesion, 50% stenosed.  Prox RCA-2 lesion, 95% stenosed.  LM lesion, 10% stenosed.  Prox Cx to Mid Cx lesion, 40% stenosed.  Dist Cx lesion, 85% stenosed.  3rd Mrg lesion, 70% stenosed.  Mid RCA lesion, 100% stenosed.  Mid LAD lesion, 50% stenosed.  1. Significant 2 vessel coronary artery disease Involving the RCA and left circumflex. Moderate mid LAD disease.  2. Normal LV systolic function by noninvasive imaging. 3. Mildly elevated left ventricular end-diastolic pressure with mild gradient across the aortic valve.  Recommendations: The right coronary artery is chronically occluded with left-to-right collaterals. There is significant stenosis in the distal left circumflex at the bifurcation of OM 3/AV groove. Given that the patient has no clear symptoms of angina and his stress test was overall low risk, I think the risk of PCI in the distal left circumflex outweighs the benefits. I recommend continuing aggressive medical therapy and reserving PCI for refractory anginal symptoms.  History of Present Illness  74 yo [place] man with HTN, CVA, cerebrovascular disease s/p bilateral CEA (left in Sep and right in Dec 2014), presented to [facility] ER 06/02/15 with brief dizziness at rest. No fever, cough, N/V, CP, SOB, orthopnea, PND, edema, syncope. In the ER, ECG LBBB (new compared to 06/2013). They were also concerned for possible ST elevation and had started Heparin bolus 4000 U and then infusion, regular dose ASA. Negative troponins. BP 151/92, HR 62, 93% RA, 18 RR. Therefore he was transferred to [facility] ER for emergent evaluation. He was symptoms free here. Able to walk around the hall without difficulty. Normal BP.   Hospital  Course  The patient was admitted for observation with plan for  rest/stress myocardial perfusion imaging due to new LBBB and history of atherosclerotic vascular disease.  06/03/15: Dizziness and slightly blurry vision persist, although better since admission. Cardiac enzyme was normal. No arrhythmia on telemetry. No chest pain. Excellent LDL-C level on statin. Top x 3 negative. HgbA1C 6.4, TSH normal.   06/04/15: Visual changes resolved. Tele showed SR with LBBB and 1st degree AV block. Echo showed LE EF of 50-55%, hypokinesis of the mid-apical anteroseptal myocardium, grade 1 DD, aortic root dimension 67mm, mildly dilated ascending aorta. Will discuss with MD for further plan. Lexiscan showed large fixed perfusion defect involving the inferior wall extending from the base to the apex. The apex is perfused. There is no stress-induced perfusion defect. Low risk Myoviw. Pending Carotid US (last evaluation 08/2013).  06/05/15: No chest pain or SOB. Felt dizzy while walking to bathroom, resolved. He did well with PT. Carotid dopplers pending. plan for left heart cath for next day.  06/06/15: Had mild chest tightness. Telemetry shows NSR with PACs. Cath showed right coronary artery -  chronically occluded with left-to-right collaterals. There is significant stenosis in the distal left circumflex at the bifurcation of OM 3/AV groove. Recommended medical therapy. Cartoid US showed -39% right internal carotid artery stenosis and 40-59% left internal carotid artery stenosis. Proximal endarterectomy site is patent bilaterally. Vertebral arteries are patent with antegrade flow.  06/07/15: The patient was doing well after cath. Rhythm was NSR. He felt stable for discharge. He will f/u with [place] at [place].   Discharge Vitals   Blood pressure 128/76, pulse 62, temperature 97.8 F (36.6 C), temperature source Oral, resp. rate 18, height 5\' 9"  (1.753 m), weight 205 lb 9.6 oz (93.26 kg), SpO2 98 %.  Filed Weights     06/05/15 0655 06/06/15 0526 06/07/15 0706  Weight: 204 lb 8 oz (92.761 kg) 206 lb (93.441 kg) 205 lb 9.6 oz (93.26 kg)    Labs  CBC No results for input(s): WBC, NEUTROABS, HGB, HCT, MCV, PLT in the last 72 hours. Basic Metabolic Panel  Recent Labs  06/05/15 1637  NA 136  K 3.7  CL 99*  CO2 26  GLUCOSE 159*  BUN 20  CREATININE 1.57*  CALCIUM 9.5    Disposition  Pt is being discharged home today in good condition.  Follow-up Plans & Appointments  Follow-up Information    Follow up with Herminio Commons, MD On 06/21/2015.   Specialty:  Cardiology   Why:  @8 :40am cardiology   Contact information:   [place] [address] [phone]           Discharge Instructions    Diet - low sodium heart healthy    Complete by:  As directed      Discharge instructions    Complete by:  As directed   No driving for 5 days. No lifting over 5 lbs for 1 week. No sexual activity for 1 week. You may return to work on 06/12/15. Keep procedure site clean & dry. If you notice increased pain, swelling, bleeding or pus, call/return!  You may shower, but no soaking baths/hot tubs/pools for 1 week.     Increase activity slowly    Complete by:  As directed            F/u Labs/Studies: Consider OP f/u labs 6-8 weeks given statin initiation this admission.  Discharge Medications    Medication List  TAKE these medications        amLODipine 10 MG tablet  Commonly known as:  NORVASC  Take 10 mg by mouth daily.     aspirin 81 MG tablet  Take 81 mg by mouth daily.     atorvastatin 40 MG tablet  Commonly known as:  LIPITOR  Take 1 tablet (40 mg total) by mouth daily.     finasteride 5 MG tablet  Commonly known as:  PROSCAR  Take 5 mg by mouth daily.     hydrochlorothiazide 25 MG tablet  Commonly known as:  HYDRODIURIL  Take 25 mg by mouth daily.        Duration of Discharge Encounter   Greater than 30 minutes including physician  time.  Signed, Cataleya Cristina PA-C 06/07/2015, 3:11 PM

## 2015-06-07 NOTE — Progress Notes (Signed)
Discharge instructions completed with patient.  Med list reviewed.

## 2015-06-07 NOTE — Care Management Important Message (Signed)
Important Message  Patient Details  Name: [name] MRN: [id number] Date of Birth: Jul 31, 1941   Medicare Important Message Given:  Yes-second notification given    Pricilla Handler 06/07/2015, 1:57 PM

## 2015-06-07 NOTE — Progress Notes (Signed)
Patient Name: [name] Date of Encounter: 06/07/2015     Principal Problem:   LBBB (left bundle branch block) Active Problems:   Dizziness    SUBJECTIVE  The patient is doing well after cath yesterday. Rhythm is NSR. Cath results :   Prox RCA-1 lesion, 50% stenosed.  Prox RCA-2 lesion, 95% stenosed.  LM lesion, 10% stenosed.  Prox Cx to Mid Cx lesion, 40% stenosed.  Dist Cx lesion, 85% stenosed.  3rd Mrg lesion, 70% stenosed.  Mid RCA lesion, 100% stenosed.  Mid LAD lesion, 50% stenosed.  1. Significant 2 vessel coronary artery disease Involving the RCA and left circumflex. Moderate mid LAD disease.  2. Normal LV systolic function by noninvasive imaging. 3. Mildly elevated left ventricular end-diastolic pressure with mild gradient across the aortic valve.  Recommendations: The right coronary artery is chronically occluded with left-to-right collaterals. There is significant stenosis in the distal left circumflex at the bifurcation of OM 3/AV groove. Given that the patient has no clear symptoms of angina and his stress test was overall low risk, I think the risk of PCI in the distal left circumflex outweighs the benefits. I recommend continuing aggressive medical therapy and reserving PCI for refractory anginal symptoms.  CURRENT MEDS . amLODipine  5 mg Oral Daily  . aspirin EC  81 mg Oral Daily  . atorvastatin  40 mg Oral Daily  . finasteride  5 mg Oral Daily  . hydrochlorothiazide  12.5 mg Oral Daily  . sodium chloride  3 mL Intravenous Q12H    OBJECTIVE  Filed Vitals:   06/06/15 1515 06/06/15 2100 06/07/15 0206 06/07/15 0706  BP: 115/65 114/59 106/61 125/75  Pulse: 63 60 79 57  Temp:  97.2 F (36.2 C) 97.4 F (36.3 C) 97.8 F (36.6 C)  TempSrc:  Oral Oral Oral  Resp: 18 18 18 18   Height:      Weight:    205 lb 9.6 oz (93.26 kg)  SpO2: 100% 96% 95% 95%    Intake/Output Summary (Last 24 hours) at 06/07/15 1002 Last data filed at  06/07/15 0623  Gross per 24 hour  Intake 1720.8 ml  Output      0 ml  Net 1720.8 ml   Filed Weights   06/05/15 0655 06/06/15 0526 06/07/15 0706  Weight: 204 lb 8 oz (92.761 kg) 206 lb (93.441 kg) 205 lb 9.6 oz (93.26 kg)    PHYSICAL EXAM  General: Pleasant, NAD. Neuro: Alert and oriented X 3. Moves all extremities spontaneously. Psych: Normal affect. HEENT:  Normal  Neck: Supple without bruits or JVD. Lungs:  Resp regular and unlabored, CTA. Heart: RRR no s3, s4, or murmurs. Abdomen: Soft, non-tender, non-distended, BS + x 4.  Extremities: No clubbing, cyanosis or edema. DP/PT/Radials 2+ and equal bilaterally.  Accessory Clinical Findings  CBC No results for input(s): WBC, NEUTROABS, HGB, HCT, MCV, PLT in the last 72 hours. Basic Metabolic Panel  Recent Labs  06/05/15 1637  NA 136  K 3.7  CL 99*  CO2 26  GLUCOSE 159*  BUN 20  CREATININE 1.57*  CALCIUM 9.5   Liver Function Tests No results for input(s): AST, ALT, ALKPHOS, BILITOT, PROT, ALBUMIN in the last 72 hours. No results for input(s): LIPASE, AMYLASE in the last 72 hours. Cardiac Enzymes No results for input(s): CKTOTAL, CKMB, CKMBINDEX, TROPONINI in the last 72 hours. BNP Invalid input(s): POCBNP D-Dimer No results for input(s): DDIMER in the last 72 hours. Hemoglobin A1C No results for input(s):  HGBA1C in the last 72 hours. Fasting Lipid Panel No results for input(s): CHOL, HDL, LDLCALC, TRIG, CHOLHDL, LDLDIRECT in the last 72 hours. Thyroid Function Tests No results for input(s): TSH, T4TOTAL, T3FREE, THYROIDAB in the last 72 hours.  Invalid input(s): FREET3  TELE  Normal sinus rhythm  ECG  Normal sinus rhythm.  Left bundle branch block.  Radiology/Studies  Nm Myocar Multi W/spect W/wall Motion / Ef  06/04/2015   CLINICAL DATA:  Chest pain  EXAM: MYOCARDIAL IMAGING WITH SPECT (REST AND PHARMACOLOGIC-STRESS)  GATED LEFT VENTRICULAR WALL MOTION STUDY  LEFT VENTRICULAR EJECTION FRACTION   TECHNIQUE: Standard myocardial SPECT imaging was performed after resting intravenous injection of 10 mCi Tc-52m sestamibi. Subsequently, intravenous infusion of Lexiscan was performed under the supervision of the Cardiology staff. At peak effect of the drug, 30 mCi Tc-50m sestamibi was injected intravenously and standard myocardial SPECT imaging was performed. Quantitative gated imaging was also performed to evaluate left ventricular wall motion, and estimate left ventricular ejection fraction.  COMPARISON:  None.  FINDINGS: Perfusion: Large fixed perfusion defect involving the inferior wall extending from the base to the apex. The apex is perfused. There is no stress-induced perfusion defect.  Wall Motion: Normal left ventricular wall motion. No left ventricular dilation.  Left Ventricular Ejection Fraction: 59 %  End diastolic volume 78 ml  End systolic volume 32 ml  IMPRESSION: 1. Fixed defect involves the inferior wall as described. No stress-induced ischemia.  2. Normal left ventricular wall motion.  3. Left ventricular ejection fraction 59%  4. Low-risk stress test findings*.  *2012 Appropriate Use Criteria for Coronary Revascularization Focused Update: J Am Coll Cardiol. 1975;88(3):254-982. http://content.airportbarriers.com.aspx?articleid=1201161   Electronically Signed   By: Marybelle Killings [name]   On: 06/04/2015 14:20    ASSESSMENT AND PLAN 1. Abnormal Myoview. 2. CAD by cath--medical therapy.  Continue ASA, statin. 3. LBBB 4.  Mild bilateral carotid artery stenosis--continue aspirin and statin therapy  Plan: Okay for discharge today.  His medical doctor is Dr. Manuella Ghazi in [place].  He would also like to be followed by cardiology in [place].  He does not drive and he has transportation issues.  Signed, Warren Danes MD

## 2015-06-07 NOTE — Progress Notes (Signed)
Physical Therapy Treatment and discharge Patient Details Name: [name] MRN: [id number] DOB: [birth year] Today's Date: 06/07/2015    History of Present Illness 74 yo [place] man with HTN, CVA, cerebrovascular disease s/p bilateral CEA (left in Sep and right in Dec 2014), presented to [facility] ER with brief dizziness at rest.    PT Comments    Pt demonstrating improved gait pattern today with no dizziness with any transitional movements.  Pt educated on home therex program in standing.  Will d/c from PT at this time.  Please re-order if the need arises.  Follow Up Recommendations  No PT follow up     Equipment Recommendations  None recommended by PT    Recommendations for Other Services       Precautions / Restrictions Precautions Precautions: None    Mobility  Bed Mobility Overal bed mobility: Independent             General bed mobility comments: no dizziness today  Transfers Overall transfer level: Independent Equipment used: None             General transfer comment: no dizziness noted upon standing  Ambulation/Gait Ambulation/Gait assistance: Modified independent (Device/Increase time) Ambulation Distance (Feet): 330 Feet Assistive device: None Gait Pattern/deviations: WFL(Within Functional Limits) Gait velocity: WNL   General Gait Details: Pt with more normalized gait pattern today with quick turns and no LOB.   Stairs            Wheelchair Mobility    Modified Rankin (Stroke Patients Only)       Balance Overall balance assessment: No apparent balance deficits (not formally assessed)                                  Cognition Arousal/Alertness: Awake/alert Behavior During Therapy: WFL for tasks assessed/performed Overall Cognitive Status: Within Functional Limits for tasks assessed                      Exercises General Exercises - Lower Extremity Hip Flexion/Marching: Strengthening;Standing;20  reps;Both Mini-Sqauts: Strengthening;10 reps;Standing    General Comments        Pertinent Vitals/Pain      Home Living                      Prior Function            PT Goals (current goals can now be found in the care plan section) Acute Rehab PT Goals Patient Stated Goal: none stated PT Goal Formulation: With patient Time For Goal Achievement: 06/19/15 Potential to Achieve Goals: Good Progress towards PT goals: Goals met/education completed, patient discharged from PT    Frequency  Other (Comment) (d/c from PT)    PT Plan Current plan remains appropriate    Co-evaluation             End of Session Equipment Utilized During Treatment: Gait belt Activity Tolerance: Patient tolerated treatment well Patient left: in bed;with call bell/phone within reach     Time: 7681-1572 PT Time Calculation (min) (ACUTE ONLY): 11 min  Charges:  $Gait Training: 8-22 mins                    G Codes:      [name] 06/07/2015, 12:35 PM

## 2015-06-07 NOTE — Progress Notes (Signed)
Pt's relative [employer] notified that pt is going to be d/c home today and need a ride as instructed by primary nurse.  Ms Theda Sers states  "I Will pick him up after 2pm."  Primary nurse made aware.  Karie Kirks, Therapist, sports.

## 2015-06-21 ENCOUNTER — Encounter: Payer: Self-pay | Admitting: Cardiovascular Disease

## 2015-06-21 ENCOUNTER — Ambulatory Visit (INDEPENDENT_AMBULATORY_CARE_PROVIDER_SITE_OTHER): Payer: Medicare Other | Admitting: Cardiovascular Disease

## 2015-06-21 VITALS — BP 132/71 | HR 36 | Ht 69.0 in | Wt 214.0 lb

## 2015-06-21 DIAGNOSIS — Z9289 Personal history of other medical treatment: Secondary | ICD-10-CM

## 2015-06-21 DIAGNOSIS — I10 Essential (primary) hypertension: Secondary | ICD-10-CM

## 2015-06-21 DIAGNOSIS — I2510 Atherosclerotic heart disease of native coronary artery without angina pectoris: Secondary | ICD-10-CM

## 2015-06-21 DIAGNOSIS — Z87898 Personal history of other specified conditions: Secondary | ICD-10-CM

## 2015-06-21 DIAGNOSIS — I779 Disorder of arteries and arterioles, unspecified: Secondary | ICD-10-CM

## 2015-06-21 DIAGNOSIS — E785 Hyperlipidemia, unspecified: Secondary | ICD-10-CM

## 2015-06-21 DIAGNOSIS — I739 Peripheral vascular disease, unspecified: Secondary | ICD-10-CM

## 2015-06-21 DIAGNOSIS — I447 Left bundle-branch block, unspecified: Secondary | ICD-10-CM | POA: Diagnosis not present

## 2015-06-21 NOTE — Patient Instructions (Addendum)
Remain off of the HCTZ.   Continue all other medications.   Follow up in  3-4 months.

## 2015-06-21 NOTE — Progress Notes (Signed)
Patient ID: [name], male   DOB: Dec 26, 1940, 74 y.o.   MRN: [id number]      SUBJECTIVE: The patient presents for a transition of care appointment. This is my first time meeting him. He was recently hospitalized for dizziness and a new left bundle branch block with concerns for a possible ST elevation myocardial infarction.   He underwent coronary angiography which demonstrated significant two-vessel coronary artery disease involving the RCA and left circumflex. There was moderate mid LAD disease. The RCA was noted to be chronically occluded with left-to-right collaterals. There was significant stenosis in the distal left circumflex at the bifurcation of the third obtuse marginal branch and AV groove branch.  He had no clear symptoms of angina and ultimately underwent a stress test which did not demonstrate any ischemia. There was a large fixed perfusion defect involving the inferior wall thinning from the apex to the base with no reversibility.  Echocardiogram demonstrated normal left ventricular systolic function, LVEF 60-10%.  It was ultimately decided that the risk of PCI in the distal left circumflex weight benefits an aggressive medical therapy was recommended resuming PCI for refractory anginal symptoms.  Past medical history also includes hypertension, CVA, status post bilateral carotid endarterectomy with the left performed in September and the right performed in December 2014.  Cath results :   Prox RCA-1 lesion, 50% stenosed.  Prox RCA-2 lesion, 95% stenosed.  LM lesion, 10% stenosed.  Prox Cx to Mid Cx lesion, 40% stenosed.  Dist Cx lesion, 85% stenosed.  3rd Mrg lesion, 70% stenosed.  Mid RCA lesion, 100% stenosed.  Mid LAD lesion, 50% stenosed.   He is feeling well from a cardiac standpoint and denies chest pain, palpitations, leg swelling, and shortness of breath. He had been on Plavix in the past but this was stopped due to bleeding issues. It also been on  hydrochlorothiazide but this was stopped by his PCP. Creatinine on 06/05/15 was 1.57.   Carotid Dopplers on 06/06/15 showed 1-39% right internal carotid artery stenosis and 40-59% left internal carotid artery stenosis.   He is very tearful as he recently lost his daughter to ovarian cancer at the age of 35. He has been widowed since 2003. He has a disabled son who lived with him for 67 years but was recently placed in a group home.   ECG performed in the office today demonstrates sinus rhythm with frequent PACs in a pattern of atrial bigeminy and a left bundle branch block.     Review of Systems: As per "subjective", otherwise negative.  Allergies  Allergen Reactions  . Strawberry Shortness Of Breath and Swelling  . Ace Inhibitors     POSSIBLE ANGIOEDEMA  . Cardura [Doxazosin Mesylate] Other (See Comments)    Erythema  . Penicillins Rash    RASH    Current Outpatient Prescriptions  Medication Sig Dispense Refill  . amLODipine (NORVASC) 10 MG tablet Take 10 mg by mouth daily.    Marland Kitchen aspirin 81 MG tablet Take 81 mg by mouth daily.    Marland Kitchen atorvastatin (LIPITOR) 40 MG tablet Take 1 tablet (40 mg total) by mouth daily. 30 tablet 11  . finasteride (PROSCAR) 5 MG tablet Take 5 mg by mouth daily.    . hydrochlorothiazide (HYDRODIURIL) 25 MG tablet Take 25 mg by mouth daily.     No current facility-administered medications for this visit.    Past Medical History  Diagnosis Date  . Carotid artery occlusion   . Cervicalgia   .  Depressive disorder, not elsewhere classified   . BPH (benign prostatic hypertrophy)   . Pain in limb   . Unspecified transient cerebral ischemia   . Varicose veins   . Inguinal hernia without mention of obstruction or gangrene, unilateral or unspecified, (not specified as recurrent)   . Unspecified hemorrhoids without mention of complication   . Cerebrovascular disease, unspecified   . Hypertension   . Other psoriasis   . Backache, unspecified   . Stroke   .  Anxiety   . Enlarged prostate   . Arthritis     Past Surgical History  Procedure Laterality Date  . Spine surgery  1992    Dr. Amado Coe  . Back surgery    . Hernia repair    . Vasectomy    . Colonoscopy    . Endarterectomy Left 07/14/2013    Procedure: ENDARTERECTOMY CAROTID-LEFT;  Surgeon: Serafina Mitchell, MD;  Location: [place];  Service: Vascular;  Laterality: Left;  . Patch angioplasty Left 07/14/2013    Procedure: PATCH ANGIOPLASTY using Vascu-Guard Vascular Patch;  Surgeon: Serafina Mitchell, MD;  Location: [place];  Service: Vascular;  Laterality: Left;  . Endarterectomy Right 10/13/2013    Procedure: RIGHT CAROTID ARTERY ENDARTERECTOMY WITH VASCU GUARD PATCH ANGIOPLASTY;  Surgeon: Serafina Mitchell, MD;  Location: [place] [facility];  Service: Vascular;  Laterality: Right;  . Carotid endarterectomy Right 10-13-13    cea  . Carotid endarterectomy Left 07-14-13    cea  . Cardiac catheterization N/A 06/06/2015    Procedure: Left Heart Cath and Coronary Angiography;  Surgeon: Wellington Hampshire, MD;  Location: [place] [facility];  Service: Cardiovascular;  Laterality: N/A;    Social History   Social History  . Marital Status: Widowed    Spouse Name: N/A  . Number of Children: N/A  . Years of Education: N/A   Occupational History  . Not on file.   Social History Main Topics  . Smoking status: Never Smoker   . Smokeless tobacco: Never Used  . Alcohol Use: No  . Drug Use: No  . Sexual Activity: Not on file   Other Topics Concern  . Not on file   Social History Narrative     Filed Vitals:   06/21/15 0826  BP: 132/71  Pulse: 36  Height: 5\' 9"  (1.753 m)  Weight: 214 lb (97.07 kg)    PHYSICAL EXAM General: NAD HEENT: Normal. Neck: No JVD, no thyromegaly. Lungs: Clear to auscultation bilaterally with normal respiratory effort. CV: Nondisplaced PMI.  Regular rate with regular rhythm and frequent premature contractions, normal S1/S2, no S3/S4, no murmur. No pretibial or periankle  edema.  No carotid bruit.    Abdomen: Soft, nontender, no hepatosplenomegaly, no distention.  Neurologic: Alert and oriented x 3.  Psych: Tearful. Skin: Normal. Musculoskeletal: Normal range of motion, no gross deformities. Extremities: No clubbing or cyanosis.   ECG: Most recent ECG reviewed.      ASSESSMENT AND PLAN: 1. Severe CAD: Stable ischemic heart disease. Continue ASA and Lipitor.  2. Essential HTN: Controlled on amlodipine 10 mg. No changes.  3. Hyperlipidemia:  Lipid panel on 06/02/15 showed total cholesterol 101, triglycerides 108, HDL 37, LDL 42. Continue Lipitor 40 mg.   4. PVD: Carotid Doppler results noted above. Will repeat in 1 year.  Dispo: f/u 3-4 months.  Kate Sable, [name], F.A.C.C.

## 2015-06-21 NOTE — Addendum Note (Signed)
Addended by: Laurine Blazer on: 06/21/2015 09:11 AM   Modules accepted: Orders, Medications

## 2015-06-22 ENCOUNTER — Encounter: Payer: Medicare Other | Admitting: Cardiovascular Disease

## 2015-09-25 ENCOUNTER — Ambulatory Visit: Payer: Medicare Other | Admitting: Cardiology

## 2015-09-26 ENCOUNTER — Ambulatory Visit: Payer: Medicare Other | Admitting: Cardiovascular Disease

## 2015-10-18 ENCOUNTER — Encounter: Payer: Self-pay | Admitting: Cardiovascular Disease

## 2015-10-18 ENCOUNTER — Ambulatory Visit (INDEPENDENT_AMBULATORY_CARE_PROVIDER_SITE_OTHER): Payer: Medicare Other | Admitting: Cardiovascular Disease

## 2015-10-18 VITALS — BP 112/62 | HR 45 | Ht 69.0 in | Wt 218.0 lb

## 2015-10-18 DIAGNOSIS — I447 Left bundle-branch block, unspecified: Secondary | ICD-10-CM

## 2015-10-18 DIAGNOSIS — I2510 Atherosclerotic heart disease of native coronary artery without angina pectoris: Secondary | ICD-10-CM

## 2015-10-18 DIAGNOSIS — E785 Hyperlipidemia, unspecified: Secondary | ICD-10-CM | POA: Diagnosis not present

## 2015-10-18 DIAGNOSIS — I10 Essential (primary) hypertension: Secondary | ICD-10-CM

## 2015-10-18 DIAGNOSIS — I779 Disorder of arteries and arterioles, unspecified: Secondary | ICD-10-CM | POA: Diagnosis not present

## 2015-10-18 DIAGNOSIS — I739 Peripheral vascular disease, unspecified: Secondary | ICD-10-CM

## 2015-10-18 NOTE — Patient Instructions (Signed)
Your physician recommends that you schedule a follow-up appointment in: 3-4 MONTHS WITH DR KONESWARAN  Your physician recommends that you continue on your current medications as directed. Please refer to the Current Medication list given to you today.  Thank you for choosing [facility]!!    

## 2015-10-18 NOTE — Progress Notes (Signed)
Patient ID: [name], male   DOB: December 17, 1940, 74 y.o.   MRN: [id number]      SUBJECTIVE: The patient presents for follow-up for severe coronary artery disease.  In summary, he was hospitalized in 05/2015 for dizziness and a new left bundle branch block with concerns for a possible ST elevation myocardial infarction.   He underwent coronary angiography which demonstrated significant two-vessel coronary artery disease involving the RCA and left circumflex. There was moderate mid LAD disease. The RCA was noted to be chronically occluded with left-to-right collaterals. There was significant stenosis in the distal left circumflex at the bifurcation of the third obtuse marginal branch and AV groove branch.  He had no clear symptoms of angina and ultimately underwent a stress test which did not demonstrate any ischemia. There was a large fixed perfusion defect involving the inferior wall thinning from the apex to the base with no reversibility.  Echocardiogram demonstrated normal left ventricular systolic function, LVEF 99991111.  It was ultimately decided that the risk of PCI in the distal left circumflex weight benefits an aggressive medical therapy was recommended resuming PCI for refractory anginal symptoms.  Past medical history also includes hypertension, CVA, status post bilateral carotid endarterectomy with the left performed in September and the right performed in December 2014.  He denies chest pain, shortness of breath, and dizziness. Has leg swelling for he takes HCTZ.    Review of Systems: As per "subjective", otherwise negative.  Allergies  Allergen Reactions  . Strawberry Extract Shortness Of Breath and Swelling  . Ace Inhibitors     POSSIBLE ANGIOEDEMA  . Cardura [Doxazosin Mesylate] Other (See Comments)    Erythema  . Penicillins Rash    RASH    Current Outpatient Prescriptions  Medication Sig Dispense Refill  . amLODipine (NORVASC) 10 MG tablet Take 10 mg by mouth  daily.    Marland Kitchen aspirin 81 MG tablet Take 81 mg by mouth daily.    Marland Kitchen atorvastatin (LIPITOR) 40 MG tablet Take 1 tablet (40 mg total) by mouth daily. 30 tablet 11  . finasteride (PROSCAR) 5 MG tablet Take 5 mg by mouth daily.    . hydrochlorothiazide (HYDRODIURIL) 25 MG tablet Take 25 mg by mouth daily.     No current facility-administered medications for this visit.    Past Medical History  Diagnosis Date  . Carotid artery occlusion   . Cervicalgia   . Depressive disorder, not elsewhere classified   . BPH (benign prostatic hypertrophy)   . Pain in limb   . Unspecified transient cerebral ischemia   . Varicose veins   . Inguinal hernia without mention of obstruction or gangrene, unilateral or unspecified, (not specified as recurrent)   . Unspecified hemorrhoids without mention of complication   . Cerebrovascular disease, unspecified   . Hypertension   . Other psoriasis   . Backache, unspecified   . Stroke ([place])   . Anxiety   . Enlarged prostate   . Arthritis     Past Surgical History  Procedure Laterality Date  . Spine surgery  1992    Dr. Amado Coe  . Back surgery    . Hernia repair    . Vasectomy    . Colonoscopy    . Endarterectomy Left 07/14/2013    Procedure: ENDARTERECTOMY CAROTID-LEFT;  Surgeon: Serafina Mitchell, MD;  Location: [place];  Service: Vascular;  Laterality: Left;  . Patch angioplasty Left 07/14/2013    Procedure: PATCH ANGIOPLASTY using Vascu-Guard Vascular Patch;  Surgeon: Butch Penny  Trula Slade, MD;  Location: [place];  Service: Vascular;  Laterality: Left;  . Endarterectomy Right 10/13/2013    Procedure: RIGHT CAROTID ARTERY ENDARTERECTOMY WITH VASCU GUARD PATCH ANGIOPLASTY;  Surgeon: Serafina Mitchell, MD;  Location: [place] [facility];  Service: Vascular;  Laterality: Right;  . Carotid endarterectomy Right 10-13-13    cea  . Carotid endarterectomy Left 07-14-13    cea  . Cardiac catheterization N/A 06/06/2015    Procedure: Left Heart Cath and Coronary Angiography;  Surgeon:  Wellington Hampshire, MD;  Location: [place] [facility];  Service: Cardiovascular;  Laterality: N/A;    Social History   Social History  . Marital Status: Widowed    Spouse Name: N/A  . Number of Children: N/A  . Years of Education: N/A   Occupational History  . Not on file.   Social History Main Topics  . Smoking status: Never Smoker   . Smokeless tobacco: Never Used  . Alcohol Use: No  . Drug Use: No  . Sexual Activity: Not on file   Other Topics Concern  . Not on file   Social History Narrative     Filed Vitals:   10/18/15 0806  BP: 112/62  Pulse: 45  Height: 5\' 9"  (1.753 m)  Weight: 218 lb (98.884 kg)  SpO2: 99%    PHYSICAL EXAM General: NAD HEENT: Normal. Neck: No JVD, no thyromegaly. Lungs: Clear to auscultation bilaterally with normal respiratory effort. CV: Bradycardic, regular rhythm, normal S1/S2, no S3/S4, no murmur. No pretibial or periankle edema.  No carotid bruit.   Abdomen: Soft, nontender, no distention.  Neurologic: Alert and oriented x 3.  Psych: Normal affect. Skin: Normal. Musculoskeletal: No gross deformities. Extremities: No clubbing or cyanosis.   ECG: Most recent ECG reviewed.      ASSESSMENT AND PLAN: 1. Severe CAD: Stable ischemic heart disease. Continue ASA and Lipitor.  2. Essential HTN: Controlled on amlodipine 10 mg and HCTZ 25 mg. No changes.  3. Hyperlipidemia: Lipid panel on 06/02/15 showed total cholesterol 101, triglycerides 108, HDL 37, LDL 42. Continue Lipitor 40 mg.   4. PVD: Carotid Doppler results noted above. Will repeat in 1 year.  Dispo: f/u 3-4 months.   Kate Sable, [name], F.A.C.C.

## 2016-01-17 ENCOUNTER — Ambulatory Visit: Payer: Medicare Other | Admitting: Cardiovascular Disease

## 2016-01-18 ENCOUNTER — Ambulatory Visit: Payer: Medicare Other | Admitting: Cardiovascular Disease

## 2016-02-01 ENCOUNTER — Encounter: Payer: Self-pay | Admitting: Cardiovascular Disease

## 2016-02-01 ENCOUNTER — Ambulatory Visit (INDEPENDENT_AMBULATORY_CARE_PROVIDER_SITE_OTHER): Payer: Medicare Other | Admitting: Cardiovascular Disease

## 2016-02-01 VITALS — BP 130/78 | HR 71 | Ht 69.0 in | Wt 215.0 lb

## 2016-02-01 DIAGNOSIS — I2510 Atherosclerotic heart disease of native coronary artery without angina pectoris: Secondary | ICD-10-CM

## 2016-02-01 DIAGNOSIS — I779 Disorder of arteries and arterioles, unspecified: Secondary | ICD-10-CM

## 2016-02-01 DIAGNOSIS — I447 Left bundle-branch block, unspecified: Secondary | ICD-10-CM

## 2016-02-01 DIAGNOSIS — I739 Peripheral vascular disease, unspecified: Secondary | ICD-10-CM

## 2016-02-01 DIAGNOSIS — E785 Hyperlipidemia, unspecified: Secondary | ICD-10-CM

## 2016-02-01 DIAGNOSIS — I10 Essential (primary) hypertension: Secondary | ICD-10-CM | POA: Diagnosis not present

## 2016-02-01 NOTE — Progress Notes (Signed)
Patient ID: CHAISE GOLDSBORO, male   DOB: Jul 01, 1941, 75 y.o.   MRN: [id number]      SUBJECTIVE: The patient presents for follow-up for severe coronary artery disease.  In summary, he was hospitalized in 05/2015 for dizziness and a new left bundle branch block with concerns for a possible ST elevation myocardial infarction.   He underwent coronary angiography which demonstrated significant two-vessel coronary artery disease involving the RCA and left circumflex. There was moderate mid LAD disease. The RCA was noted to be chronically occluded with left-to-right collaterals. There was significant stenosis in the distal left circumflex at the bifurcation of the third obtuse marginal branch and AV groove branch.  He had no clear symptoms of angina and ultimately underwent a stress test which did not demonstrate any ischemia. There was a large fixed perfusion defect involving the inferior wall thinning from the apex to the base with no reversibility.  Echocardiogram demonstrated normal left ventricular systolic function, LVEF 99991111.  It was ultimately decided that the risk of PCI in the distal left circumflex weight benefits an aggressive medical therapy was recommended resuming PCI for refractory anginal symptoms.  Past medical history also includes hypertension, CVA, status post bilateral carotid endarterectomy with the left performed in September and the right performed in December 2014.  He denies chest pain, shortness of breath, and dizziness. Has leg swelling for he takes HCTZ. His biggest complaint are pruritic rashes on his back, arms, and legs for which he is going to see a dermatologist next week.  Soc: 93 yr old daughter died of ovarian cancer in 12/04/14.   Review of Systems: As per "subjective", otherwise negative.  Allergies  Allergen Reactions  . Strawberry Extract Shortness Of Breath and Swelling  . Ace Inhibitors     POSSIBLE ANGIOEDEMA  . Cardura [Doxazosin Mesylate] Other  (See Comments)    Erythema  . Penicillins Rash    RASH    Current Outpatient Prescriptions  Medication Sig Dispense Refill  . amLODipine (NORVASC) 10 MG tablet Take 10 mg by mouth daily.    Marland Kitchen aspirin 81 MG tablet Take 81 mg by mouth daily.    Marland Kitchen atorvastatin (LIPITOR) 40 MG tablet Take 1 tablet (40 mg total) by mouth daily. 30 tablet 11  . finasteride (PROSCAR) 5 MG tablet Take 5 mg by mouth daily.    . hydrochlorothiazide (HYDRODIURIL) 25 MG tablet Take 25 mg by mouth daily.    . predniSONE (DELTASONE) 50 MG tablet Take 50 mg by mouth daily.  0  . ranitidine (ZANTAC) 150 MG tablet Take 150 mg by mouth 2 (two) times daily.     No current facility-administered medications for this visit.    Past Medical History  Diagnosis Date  . Carotid artery occlusion   . Cervicalgia   . Depressive disorder, not elsewhere classified   . BPH (benign prostatic hypertrophy)   . Pain in limb   . Unspecified transient cerebral ischemia   . Varicose veins   . Inguinal hernia without mention of obstruction or gangrene, unilateral or unspecified, (not specified as recurrent)   . Unspecified hemorrhoids without mention of complication   . Cerebrovascular disease, unspecified   . Hypertension   . Other psoriasis   . Backache, unspecified   . Stroke ([place])   . Anxiety   . Enlarged prostate   . Arthritis     Past Surgical History  Procedure Laterality Date  . Spine surgery  1992    Dr. Amado Coe  .  Back surgery    . Hernia repair    . Vasectomy    . Colonoscopy    . Endarterectomy Left 07/14/2013    Procedure: ENDARTERECTOMY CAROTID-LEFT;  Surgeon: Serafina Mitchell, MD;  Location: [place];  Service: Vascular;  Laterality: Left;  . Patch angioplasty Left 07/14/2013    Procedure: PATCH ANGIOPLASTY using Vascu-Guard Vascular Patch;  Surgeon: Serafina Mitchell, MD;  Location: [place];  Service: Vascular;  Laterality: Left;  . Endarterectomy Right 10/13/2013    Procedure: RIGHT CAROTID ARTERY  ENDARTERECTOMY WITH VASCU GUARD PATCH ANGIOPLASTY;  Surgeon: Serafina Mitchell, MD;  Location: [place] [facility];  Service: Vascular;  Laterality: Right;  . Carotid endarterectomy Right 10-13-13    cea  . Carotid endarterectomy Left 07-14-13    cea  . Cardiac catheterization N/A 06/06/2015    Procedure: Left Heart Cath and Coronary Angiography;  Surgeon: Wellington Hampshire, MD;  Location: [place] [facility];  Service: Cardiovascular;  Laterality: N/A;    Social History   Social History  . Marital Status: Widowed    Spouse Name: N/A  . Number of Children: N/A  . Years of Education: N/A   Occupational History  . Not on file.   Social History Main Topics  . Smoking status: Never Smoker   . Smokeless tobacco: Never Used  . Alcohol Use: No  . Drug Use: No  . Sexual Activity: Not on file   Other Topics Concern  . Not on file   Social History Narrative     Filed Vitals:   02/01/16 1304  BP: 130/78  Pulse: 71  Height: 5\' 9"  (1.753 m)  Weight: 215 lb (97.523 kg)  SpO2: 97%    PHYSICAL EXAM General: NAD HEENT: Normal. Neck: No JVD, no thyromegaly. Lungs: Clear to auscultation bilaterally with normal respiratory effort. CV: Nondisplaced PMI.  Regular rate and rhythm, normal S1/S2, no [id number], soft 1/6 systolic murmur heard throughout precordium. No pretibial or periankle edema.  No carotid bruit.   Abdomen: Soft, nontender, no distention.  Neurologic: Alert and oriented.  Psych: Tearful. Musculoskeletal: No gross deformities.  ECG: Most recent ECG reviewed.      ASSESSMENT AND PLAN: 1. Severe CAD: Stable ischemic heart disease. Continue ASA and Lipitor.  2. Essential HTN: Controlled on amlodipine 10 mg and HCTZ 25 mg. No changes.  3. Hyperlipidemia: Lipid panel on 06/02/15 showed total cholesterol 101, triglycerides 108, HDL 37, LDL 42. Continue Lipitor 40 mg.   4. PVD: Carotid Dopplers 06/06/15 showed 1-39% RICA and 123456 LICA stenosis. Repeat in next 1-2 years. Continue ASA and  statin.  Dispo: f/u 6 months.   Kate Sable, [name], F.A.C.C.

## 2016-02-01 NOTE — Patient Instructions (Signed)
Your physician wants you to follow-up in: 6 months with DR. Bronson Ing You will receive a reminder letter in the mail two months in advance. If you don't receive a letter, please call our office to schedule the follow-up appointment.  Your physician recommends that you continue on your current medications as directed. Please refer to the Current Medication list given to you today.  Thank you for choosing [facility]!!

## 2016-08-01 ENCOUNTER — Encounter (HOSPITAL_COMMUNITY): Payer: Self-pay

## 2016-08-01 ENCOUNTER — Inpatient Hospital Stay (HOSPITAL_COMMUNITY)
Admission: EM | Admit: 2016-08-01 | Discharge: 2016-08-02 | DRG: 244 | Disposition: A | Discharge status: Home / Self Care | Payer: Medicare Other | Attending: Cardiology | Admitting: Cardiology

## 2016-08-01 ENCOUNTER — Encounter (HOSPITAL_COMMUNITY)
Admission: EM | Disposition: A | Discharge status: Home / Self Care | Payer: Self-pay | Source: Home / Self Care | Attending: Cardiology

## 2016-08-01 ENCOUNTER — Emergency Department (HOSPITAL_COMMUNITY): Payer: Medicare Other

## 2016-08-01 DIAGNOSIS — I10 Essential (primary) hypertension: Secondary | ICD-10-CM | POA: Diagnosis present

## 2016-08-01 DIAGNOSIS — I2510 Atherosclerotic heart disease of native coronary artery without angina pectoris: Secondary | ICD-10-CM | POA: Diagnosis present

## 2016-08-01 DIAGNOSIS — I739 Peripheral vascular disease, unspecified: Secondary | ICD-10-CM | POA: Diagnosis present

## 2016-08-01 DIAGNOSIS — Z7982 Long term (current) use of aspirin: Secondary | ICD-10-CM

## 2016-08-01 DIAGNOSIS — Z95818 Presence of other cardiac implants and grafts: Secondary | ICD-10-CM

## 2016-08-01 DIAGNOSIS — I442 Atrioventricular block, complete: Secondary | ICD-10-CM | POA: Diagnosis present

## 2016-08-01 DIAGNOSIS — Z8673 Personal history of transient ischemic attack (TIA), and cerebral infarction without residual deficits: Secondary | ICD-10-CM | POA: Diagnosis not present

## 2016-08-01 DIAGNOSIS — E785 Hyperlipidemia, unspecified: Secondary | ICD-10-CM | POA: Diagnosis present

## 2016-08-01 DIAGNOSIS — I252 Old myocardial infarction: Secondary | ICD-10-CM | POA: Diagnosis not present

## 2016-08-01 DIAGNOSIS — Z79899 Other long term (current) drug therapy: Secondary | ICD-10-CM | POA: Diagnosis not present

## 2016-08-01 DIAGNOSIS — Z8249 Family history of ischemic heart disease and other diseases of the circulatory system: Secondary | ICD-10-CM | POA: Diagnosis not present

## 2016-08-01 DIAGNOSIS — I452 Bifascicular block: Secondary | ICD-10-CM | POA: Diagnosis present

## 2016-08-01 HISTORY — PX: EP IMPLANTABLE DEVICE: SHX172B

## 2016-08-01 LAB — COMPREHENSIVE METABOLIC PANEL
ALT: 19 U/L (ref 17–63)
ANION GAP: 12 (ref 5–15)
AST: 28 U/L (ref 15–41)
Albumin: 4.3 g/dL (ref 3.5–5.0)
Alkaline Phosphatase: 69 U/L (ref 38–126)
BUN: 24 mg/dL — ABNORMAL HIGH (ref 6–20)
CHLORIDE: 98 mmol/L — AB (ref 101–111)
CO2: 30 mmol/L (ref 22–32)
CREATININE: 1.39 mg/dL — AB (ref 0.61–1.24)
Calcium: 9.9 mg/dL (ref 8.9–10.3)
GFR calc Af Amer: 55 mL/min — ABNORMAL LOW (ref >60)
GFR, EST NON AFRICAN AMERICAN: 47 mL/min — AB (ref >60)
Glucose, Bld: 126 mg/dL — ABNORMAL HIGH (ref 65–99)
POTASSIUM: 3.5 mmol/L (ref 3.5–5.1)
SODIUM: 140 mmol/L (ref 135–145)
Total Bilirubin: 1 mg/dL (ref 0.3–1.2)
Total Protein: 7.8 g/dL (ref 6.5–8.1)

## 2016-08-01 LAB — CBC WITH DIFFERENTIAL/PLATELET
Basophils Absolute: 0.1 10*3/uL (ref 0.0–0.1)
Basophils Relative: 1 %
Eosinophils Absolute: 0.3 10*3/uL (ref 0.0–0.7)
Eosinophils Relative: 3 %
HCT: 45.7 % (ref 39.0–52.0)
HEMOGLOBIN: 15.2 g/dL (ref 13.0–17.0)
LYMPHS ABS: 2.3 10*3/uL (ref 0.7–4.0)
LYMPHS PCT: 19 %
MCH: 30.7 pg (ref 26.0–34.0)
MCHC: 33.3 g/dL (ref 30.0–36.0)
MCV: 92.3 fL (ref 78.0–100.0)
MONOS PCT: 9 %
Monocytes Absolute: 1.1 10*3/uL — ABNORMAL HIGH (ref 0.1–1.0)
NEUTROS ABS: 8.4 10*3/uL — AB (ref 1.7–7.7)
NEUTROS PCT: 68 %
Platelets: 273 10*3/uL (ref 150–400)
RBC: 4.95 MIL/uL (ref 4.22–5.81)
RDW: 12.6 % (ref 11.5–15.5)
WBC: 12.2 10*3/uL — AB (ref 4.0–10.5)

## 2016-08-01 LAB — URINALYSIS, ROUTINE W REFLEX MICROSCOPIC
Bilirubin Urine: NEGATIVE
Glucose, UA: NEGATIVE mg/dL
Hgb urine dipstick: NEGATIVE
KETONES UR: NEGATIVE mg/dL
Leukocytes, UA: NEGATIVE
Nitrite: NEGATIVE
PH: 7 (ref 5.0–8.0)
Protein, ur: NEGATIVE mg/dL
Specific Gravity, Urine: 1.011 (ref 1.005–1.030)

## 2016-08-01 LAB — PROTIME-INR
INR: 0.97
Prothrombin Time: 12.9 seconds (ref 11.4–15.2)

## 2016-08-01 LAB — I-STAT TROPONIN, ED: TROPONIN I, POC: 0.02 ng/mL (ref 0.00–0.08)

## 2016-08-01 LAB — I-STAT CG4 LACTIC ACID, ED: Lactic Acid, Venous: 1.28 mmol/L (ref 0.5–1.9)

## 2016-08-01 SURGERY — PACEMAKER IMPLANT
Anesthesia: LOCAL

## 2016-08-01 MED ORDER — DIPHENHYDRAMINE HCL 50 MG/ML IJ SOLN
12.5000 mg | Freq: Four times a day (QID) | INTRAMUSCULAR | Status: DC | PRN
  Administered 2016-08-01 – 2016-08-02 (×2): 12.5 mg via INTRAVENOUS
  Filled 2016-08-01 (×2): qty 1

## 2016-08-01 MED ORDER — CHLORHEXIDINE GLUCONATE 4 % EX LIQD: 60.0000 mL | Freq: Once | CUTANEOUS | Status: DC

## 2016-08-01 MED ORDER — LIDOCAINE HCL (PF) 1 % IJ SOLN
INTRAMUSCULAR | Status: DC | PRN
  Administered 2016-08-01: 50 mL

## 2016-08-01 MED ORDER — FINASTERIDE 5 MG PO TABS
5.0000 mg | ORAL_TABLET | Freq: Every day | ORAL | Status: DC
  Administered 2016-08-01 – 2016-08-02 (×2): 5 mg via ORAL
  Filled 2016-08-01 (×2): qty 1

## 2016-08-01 MED ORDER — SODIUM CHLORIDE 0.9 % IR SOLN
80.0000 mg | Status: AC
  Administered 2016-08-01: 80 mg

## 2016-08-01 MED ORDER — VANCOMYCIN HCL IN DEXTROSE 1-5 GM/200ML-% IV SOLN
INTRAVENOUS | Status: AC
  Filled 2016-08-01: qty 200

## 2016-08-01 MED ORDER — NITROGLYCERIN 0.4 MG SL SUBL: 0.4000 mg | SUBLINGUAL_TABLET | SUBLINGUAL | Status: DC | PRN

## 2016-08-01 MED ORDER — MIDAZOLAM HCL 5 MG/5ML IJ SOLN
INTRAMUSCULAR | Status: DC | PRN
  Administered 2016-08-01: 1 mg via INTRAVENOUS

## 2016-08-01 MED ORDER — ACETAMINOPHEN 325 MG PO TABS: 325.0000 mg | ORAL_TABLET | ORAL | Status: DC | PRN

## 2016-08-01 MED ORDER — CEFAZOLIN SODIUM-DEXTROSE 2-4 GM/100ML-% IV SOLN
INTRAVENOUS | Status: AC
  Filled 2016-08-01: qty 100

## 2016-08-01 MED ORDER — HYDROCHLOROTHIAZIDE 25 MG PO TABS
25.0000 mg | ORAL_TABLET | Freq: Every day | ORAL | Status: DC
  Administered 2016-08-01 – 2016-08-02 (×2): 25 mg via ORAL
  Filled 2016-08-01 (×2): qty 1

## 2016-08-01 MED ORDER — AMLODIPINE BESYLATE 10 MG PO TABS
10.0000 mg | ORAL_TABLET | Freq: Every day | ORAL | Status: DC
  Administered 2016-08-01 – 2016-08-02 (×2): 10 mg via ORAL
  Filled 2016-08-01 (×2): qty 1

## 2016-08-01 MED ORDER — MIDAZOLAM HCL 5 MG/5ML IJ SOLN
INTRAMUSCULAR | Status: AC
  Filled 2016-08-01: qty 5

## 2016-08-01 MED ORDER — LIDOCAINE HCL (PF) 1 % IJ SOLN
INTRAMUSCULAR | Status: AC
  Filled 2016-08-01: qty 30

## 2016-08-01 MED ORDER — SODIUM CHLORIDE 0.9 % IV SOLN: INTRAVENOUS | Status: DC

## 2016-08-01 MED ORDER — SODIUM CHLORIDE 0.9 % IV BOLUS (SEPSIS)
1000.0000 mL | Freq: Once | INTRAVENOUS | Status: AC
  Administered 2016-08-01: 1000 mL via INTRAVENOUS

## 2016-08-01 MED ORDER — ASPIRIN EC 81 MG PO TBEC
81.0000 mg | DELAYED_RELEASE_TABLET | Freq: Every day | ORAL | Status: DC
  Administered 2016-08-02: 81 mg via ORAL
  Filled 2016-08-01: qty 1

## 2016-08-01 MED ORDER — ONDANSETRON HCL 4 MG/2ML IJ SOLN: 4.0000 mg | Freq: Four times a day (QID) | INTRAMUSCULAR | Status: DC | PRN

## 2016-08-01 MED ORDER — ACETAMINOPHEN 325 MG PO TABS: 650.0000 mg | ORAL_TABLET | ORAL | Status: DC | PRN

## 2016-08-01 MED ORDER — FENTANYL CITRATE (PF) 100 MCG/2ML IJ SOLN
INTRAMUSCULAR | Status: AC
  Filled 2016-08-01: qty 2

## 2016-08-01 MED ORDER — ATORVASTATIN CALCIUM 40 MG PO TABS
40.0000 mg | ORAL_TABLET | Freq: Every day | ORAL | Status: DC
  Administered 2016-08-01 (×2): 40 mg via ORAL
  Filled 2016-08-01: qty 1

## 2016-08-01 MED ORDER — SODIUM CHLORIDE 0.9% FLUSH: 3.0000 mL | Freq: Two times a day (BID) | INTRAVENOUS | Status: DC

## 2016-08-01 MED ORDER — VANCOMYCIN HCL IN DEXTROSE 1-5 GM/200ML-% IV SOLN
1000.0000 mg | INTRAVENOUS | Status: AC
  Administered 2016-08-01: 1000 mg via INTRAVENOUS

## 2016-08-01 MED ORDER — HEPARIN (PORCINE) IN NACL 2-0.9 UNIT/ML-% IJ SOLN
INTRAMUSCULAR | Status: AC
  Filled 2016-08-01: qty 500

## 2016-08-01 MED ORDER — SODIUM CHLORIDE 0.9 % IV SOLN: 250.0000 mL | INTRAVENOUS | Status: DC

## 2016-08-01 MED ORDER — PREDNISONE 20 MG PO TABS: 50.0000 mg | ORAL_TABLET | Freq: Every day | ORAL | Status: DC

## 2016-08-01 MED ORDER — SODIUM CHLORIDE 0.9% FLUSH: 3.0000 mL | INTRAVENOUS | Status: DC | PRN

## 2016-08-01 MED ORDER — HEPARIN (PORCINE) IN NACL 2-0.9 UNIT/ML-% IJ SOLN: INTRAMUSCULAR | Status: DC | PRN

## 2016-08-01 MED ORDER — FAMOTIDINE 20 MG PO TABS: 20.0000 mg | ORAL_TABLET | Freq: Two times a day (BID) | ORAL | Status: DC

## 2016-08-01 MED ORDER — FENTANYL CITRATE (PF) 100 MCG/2ML IJ SOLN
INTRAMUSCULAR | Status: DC | PRN
  Administered 2016-08-01: 25 ug via INTRAVENOUS

## 2016-08-01 MED ORDER — VANCOMYCIN HCL IN DEXTROSE 1-5 GM/200ML-% IV SOLN
1000.0000 mg | Freq: Two times a day (BID) | INTRAVENOUS | Status: AC
  Administered 2016-08-02: 1000 mg via INTRAVENOUS
  Filled 2016-08-01: qty 200

## 2016-08-01 MED ORDER — SODIUM CHLORIDE 0.9 % IR SOLN
Status: AC
  Filled 2016-08-01: qty 2

## 2016-08-01 MED ORDER — HEPARIN (PORCINE) IN NACL 2-0.9 UNIT/ML-% IJ SOLN
INTRAMUSCULAR | Status: DC | PRN
  Administered 2016-08-01: 13:00:00

## 2016-08-01 SURGICAL SUPPLY — 7 items
CABLE SURGICAL S-101-97-12 (CABLE) ×2 IMPLANT
LEAD CAPSURE NOVUS 5076-52CM (Lead) ×1 IMPLANT
LEAD CAPSURE NOVUS 5076-58CM (Lead) ×1 IMPLANT
PAD DEFIB LIFELINK (PAD) ×1 IMPLANT
PPM ADVISA MRI DR A2DR01 (Pacemaker) ×1 IMPLANT
SHEATH CLASSIC 7F (SHEATH) ×2 IMPLANT
TRAY PACEMAKER INSERTION (PACKS) ×1 IMPLANT

## 2016-08-01 NOTE — H&P (Signed)
H&P    Patient ID: [name] MRN: [id number], DOB/AGE: 1941-04-30 75 y.o.  Admit date: 08/01/2016 Date of admit: 08/01/2016   Primary Physician: Monico Blitz, MD Primary Cardiologist: Dr. Jacinta Shoe  Reason for Admission: CHB  HPI: [name] is a 75 y.o. male with known severe CAD, last cath noted below, no intervention, treated medically, PVD with b/l CEA in 2014, HTN, HLD comes to the ER with a near syncopal event associated with slurred/slow speech.  This event was witnessed by his friend ( a retired Arts administrator) she reports him as initally very tachycardic then suddenly pale, diaphoretic and profoundly weak, was able to help him ambulate back into the house from the yard, sat him in the chair and slowly he improved.  He denies any focal weakness other then the speech, remained oriented and never had LOC.   He had some mild central chest discomfort that self resolved, and remains without CP, no overt feeling of SOB, no syncope.   EMS was called and noted the patient bradycardic, strips reveal CHB 30's.  He is known to have LBBB at baseline, his escapr rhythm currently is RBBB  06/06/15, LHC, done secondary dizziness and new LBBB  Conclusion   Prox RCA-1 lesion, 50% stenosed.  Prox RCA-2 lesion, 95% stenosed.  LM lesion, 10% stenosed.  Prox Cx to Mid Cx lesion, 40% stenosed.  Dist Cx lesion, 85% stenosed.  3rd Mrg lesion, 70% stenosed.  Mid RCA lesion, 100% stenosed.  Mid LAD lesion, 50% stenosed.   1. Significant 2 vessel coronary artery disease  Involving the RCA and left circumflex. Moderate mid LAD disease.  2. Normal LV systolic function by noninvasive imaging. 3. Mildly elevated left ventricular end-diastolic pressure with mild gradient across the aortic valve. Recommendations: The right coronary artery is chronically occluded with left-to-right collaterals. There is significant stenosis in the distal left circumflex at the bifurcation of OM 3/AV groove.  Given that the patient has no clear symptoms of angina and his stress test was overall low risk, I think the risk of PCI in the distal left circumflex outweighs the benefits. I recommend continuing aggressive medical therapy and reserving PCI for refractory anginal symptoms.    Home meds negative for any nodal blocking agents LABS: BMET is pending H/H 15/45 WBC 12.2 plts 273 poc Trop 0.02  06/02/15: TSH 2.838   Past Medical History:  Diagnosis Date  . Anxiety   . Arthritis   . Backache, unspecified   . BPH (benign prostatic hypertrophy)   . Carotid artery occlusion   . Cerebrovascular disease, unspecified   . Cervicalgia   . Depressive disorder, not elsewhere classified   . Enlarged prostate   . Hypertension   . Inguinal hernia without mention of obstruction or gangrene, unilateral or unspecified, (not specified as recurrent)   . Other psoriasis   . Pain in limb   . Stroke ([place])   . Unspecified hemorrhoids without mention of complication   . Unspecified transient cerebral ischemia   . Varicose veins      Surgical History:  Past Surgical History:  Procedure Laterality Date  . BACK SURGERY    . CARDIAC CATHETERIZATION N/A 06/06/2015   Procedure: Left Heart Cath and Coronary Angiography;  Surgeon: Wellington Hampshire, MD;  Location: [facility];  Service: Cardiovascular;  Laterality: N/A;  . CAROTID ENDARTERECTOMY Right 10-13-13   cea  . CAROTID ENDARTERECTOMY Left 07-14-13   cea  . COLONOSCOPY    . ENDARTERECTOMY  Left 07/14/2013   Procedure: ENDARTERECTOMY CAROTID-LEFT;  Surgeon: Serafina Mitchell, MD;  Location: [facility];  Service: Vascular;  Laterality: Left;  . ENDARTERECTOMY Right 10/13/2013   Procedure: RIGHT CAROTID ARTERY ENDARTERECTOMY WITH VASCU GUARD PATCH ANGIOPLASTY;  Surgeon: Serafina Mitchell, MD;  Location: [facility];  Service: Vascular;  Laterality: Right;  . HERNIA REPAIR    . PATCH ANGIOPLASTY Left 07/14/2013   Procedure: PATCH ANGIOPLASTY using Vascu-Guard  Vascular Patch;  Surgeon: Serafina Mitchell, MD;  Location: [facility];  Service: Vascular;  Laterality: Left;  . SPINE SURGERY  1992   Dr. Amado Coe  . VASECTOMY        (Not in a hospital admission)  Inpatient Medications:   Allergies:  Allergies  Allergen Reactions  . Strawberry Extract Shortness Of Breath and Swelling  . Ace Inhibitors     POSSIBLE ANGIOEDEMA  . Cardura [Doxazosin Mesylate] Other (See Comments)    Erythema  . Penicillins Rash    RASH    Social History   Social History  . Marital status: Widowed    Spouse name: N/A  . Number of children: N/A  . Years of education: N/A   Occupational History  . Not on file.   Social History Main Topics  . Smoking status: Never Smoker  . Smokeless tobacco: Never Used  . Alcohol use No  . Drug use: No  . Sexual activity: Not on file   Other Topics Concern  . Not on file   Social History Narrative  . No narrative on file     Family History  Problem Relation Age of Onset  . Heart attack Father   . Cancer Daughter      Review of Systems: All other systems reviewed and are otherwise negative except as noted above.  Physical Exam: Vitals:   08/01/16 0956 08/01/16 1000 08/01/16 1015 08/01/16 1030  BP:  145/65 154/68 153/69  Pulse:  (!) 36 (!) 36 (!) 35  Resp:  13 13 16   Temp:      TempSrc:      SpO2:  98% 98% 98%  Weight: 210 lb (95.3 kg)     Height: 5\' 9"  (1.753 m)       GEN- The patient is well appearing, alert and oriented x 3 today.   HEENT: normocephalic, atraumatic; sclera clear, conjunctiva pink; hearing intact; oropharynx clear; neck supple, no JVP Lymph- no cervical lymphadenopathy Lungs- Clear to ausculation bilaterally, normal work of breathing.  No wheezes, rales, rhonchi Heart- Regular rate and rhythm, bradycardic, no murmurs, rubs or gallops, PMI not laterally displaced GI- soft, non-tender, non-distended, bowel sounds present Extremities- no clubbing, cyanosis, or edema, warm, scatterred  erythematous rash b/l UE/LE MS- no significant deformity or atrophy Skin- warm and dry, no rash or lesion Psych- euthymic mood, full affect Neuro- no gross deficits observed  Labs:   Lab Results  Component Value Date   WBC 12.2 (H) 08/01/2016   HGB 15.2 08/01/2016   HCT 45.7 08/01/2016   MCV 92.3 08/01/2016   PLT 273 08/01/2016   No results for input(s): NA, K, CL, CO2, BUN, CREATININE, CALCIUM, PROT, BILITOT, ALKPHOS, ALT, AST, GLUCOSE in the last 168 hours.  Invalid input(s): LABALBU    Radiology/Studies: No results found.  EKG: CHB, RBBB TELEMETRY: CHB, 38-40's  06/04/15: TTE Study Conclusions - Left ventricle: The cavity size was normal. There was moderate   focal basal hypertrophy of the septum. Systolic function was   normal. The estimated ejection fraction  was in the range of 50%   to 55%. There is hypokinesis of the mid-apical anteroseptal   myocardium. LBBB noted. Doppler parameters are consistent with   abnormal left ventricular relaxation (grade 1 diastolic   dysfunction). - Aorta: Aortic root dimension: 38 mm (ED). - Ascending aorta: The ascending aorta was mildly dilated.   Assessment and Plan:   1. CHB     RBBB with his escape rhythm, LBBB historically     No reversible causes identified     Plan for pacemaker today.  2. CAD:      Stable without chest pain.  3.  Slurred speech:      Likely due to hypoperfusion from CHB  Signed, Tommye Standard, PA-C 08/01/2016 11:02 AM  I have seen and examined this patient with Tommye Standard.  Agree with above, note added to reflect my findings.  On exam, bradycardic, no murmurs, lungs clear.  Presented after episode of near syncope.  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  Plan for dual chamber pacemaker placement.  Risks and benefits explained.  Risks include but are not limited to bleeding, infection, tamponade, pneumothorax.  Patient understands the risks of pacemaker placement and has agreed to the procedure.    Ebone Alcivar M.  Dougles Kimmey MD 08/01/2016 11:36 AM

## 2016-08-01 NOTE — Discharge Instructions (Signed)
° ° °  Supplemental Discharge Instructions for  Pacemaker/Defibrillator Patients  Activity No heavy lifting or vigorous activity with your left/right arm for 6 to 8 weeks.  Do not raise your left/right arm above your head for one week.  Gradually raise your affected arm as drawn below.             08/05/16                  08/06/16                   08/07/16                 08/08/16 __  NO DRIVING for 1 week    ; you may begin driving on  [id number]   .  WOUND CARE - Keep the wound area clean and dry.  Do not get this area wet for one week. No showers for one week; you may shower on 08/08/16   . - The tape/steri-strips on your wound will fall off; do not pull them off.  No bandage is needed on the site.  DO  NOT apply any creams, oils, or ointments to the wound area. - If you notice any drainage or discharge from the wound, any swelling or bruising at the site, or you develop a fever > 101? F after you are discharged home, call the office at once.  Special Instructions - You are still able to use cellular telephones; use the ear opposite the side where you have your pacemaker/defibrillator.  Avoid carrying your cellular phone near your device. - When traveling through airports, show security personnel your identification card to avoid being screened in the metal detectors.  Ask the security personnel to use the hand wand. - Avoid arc welding equipment, MRI testing (magnetic resonance imaging), TENS units (transcutaneous nerve stimulators).  Call the office for questions about other devices. - Avoid electrical appliances that are in poor condition or are not properly grounded. - Microwave ovens are safe to be near or to operate.  Additional information for defibrillator patients should your device go off: - If your device goes off ONCE and you feel fine afterward, notify the device clinic nurses. - If your device goes off ONCE and you do not feel well afterward, call 911. - If your device goes  off TWICE, call 911. - If your device goes off THREE times in one day, call 911.  DO NOT DRIVE YOURSELF OR A FAMILY MEMBER WITH A DEFIBRILLATOR TO THE HOSPITAL--CALL 911.

## 2016-08-01 NOTE — ED Notes (Signed)
Zoll at bedside with pads applied.

## 2016-08-01 NOTE — ED Provider Notes (Signed)
[facility] DEPT Provider Note   CSN: [id number] Arrival date & time: 08/01/16  [id number]     History   Chief Complaint Chief Complaint  Patient presents with  . Weakness    HPI [name] is a 75 y.o. male.  HPI   Patient is a 75 year old male with history of bilateral CEA, strokes, 2 MIs in the past. Has recent calf is also below. Today patient was walking his friend's child when all of a sudden he developed dizziness. He sat on a swing. He then developed mumbling speech was unable to articulate. His speech is been since improving slowly. EMS found him to be bradycardic on arrival. Patient report that he takes aspirin. He had large bleeding on Plavix. He has no recent URI or other symptoms.  Speech has become was completely resolved on arrival.  Past Medical History:  Diagnosis Date  . Anxiety   . Arthritis   . Backache, unspecified   . BPH (benign prostatic hypertrophy)   . Carotid artery occlusion   . Cerebrovascular disease, unspecified   . Cervicalgia   . Depressive disorder, not elsewhere classified   . Enlarged prostate   . Hypertension   . Inguinal hernia without mention of obstruction or gangrene, unilateral or unspecified, (not specified as recurrent)   . Other psoriasis   . Pain in limb   . Stroke ([place])   . Unspecified hemorrhoids without mention of complication   . Unspecified transient cerebral ischemia   . Varicose veins     Patient Active Problem List   Diagnosis Date Noted  . Dizziness 06/02/2015  . LBBB (left bundle branch block) 06/02/2015  . Aftercare following surgery of the circulatory system, [place] 11/10/2013  . Carotid stenosis 10/13/2013  . Occlusion and stenosis of carotid artery without mention of cerebral infarction 07/04/2013    Past Surgical History:  Procedure Laterality Date  . BACK SURGERY    . CARDIAC CATHETERIZATION N/A 06/06/2015   Procedure: Left Heart Cath and Coronary Angiography;  Surgeon: Wellington Hampshire, MD;   Location: [facility];  Service: Cardiovascular;  Laterality: N/A;  . CAROTID ENDARTERECTOMY Right 10-13-13   cea  . CAROTID ENDARTERECTOMY Left 07-14-13   cea  . COLONOSCOPY    . ENDARTERECTOMY Left 07/14/2013   Procedure: ENDARTERECTOMY CAROTID-LEFT;  Surgeon: Serafina Mitchell, MD;  Location: [facility];  Service: Vascular;  Laterality: Left;  . ENDARTERECTOMY Right 10/13/2013   Procedure: RIGHT CAROTID ARTERY ENDARTERECTOMY WITH VASCU GUARD PATCH ANGIOPLASTY;  Surgeon: Serafina Mitchell, MD;  Location: [facility];  Service: Vascular;  Laterality: Right;  . HERNIA REPAIR    . PATCH ANGIOPLASTY Left 07/14/2013   Procedure: PATCH ANGIOPLASTY using Vascu-Guard Vascular Patch;  Surgeon: Serafina Mitchell, MD;  Location: [facility];  Service: Vascular;  Laterality: Left;  . SPINE SURGERY  1992   Dr. Amado Coe  . VASECTOMY         Home Medications    Prior to Admission medications   Medication Sig Start Date End Date Taking? Authorizing Provider  amLODipine (NORVASC) 10 MG tablet Take 10 mg by mouth daily.    Historical Provider, MD  aspirin 81 MG tablet Take 81 mg by mouth daily.    Historical Provider, MD  atorvastatin (LIPITOR) 40 MG tablet Take 1 tablet (40 mg total) by mouth daily. 06/07/15   Bhavinkumar Bhagat, PA  finasteride (PROSCAR) 5 MG tablet Take 5 mg by mouth daily.    Historical Provider, MD  hydrochlorothiazide (HYDRODIURIL)  25 MG tablet Take 25 mg by mouth daily.    Historical Provider, MD  predniSONE (DELTASONE) 50 MG tablet Take 50 mg by mouth daily. 01/23/16   Historical Provider, MD  ranitidine (ZANTAC) 150 MG tablet Take 150 mg by mouth 2 (two) times daily.    Historical Provider, MD    Family History Family History  Problem Relation Age of Onset  . Heart attack Father   . Cancer Daughter     Social History Social History  Substance Use Topics  . Smoking status: Never Smoker  . Smokeless tobacco: Never Used  . Alcohol use No     Allergies   Strawberry extract; Ace  inhibitors; Cardura [doxazosin mesylate]; and Penicillins   Review of Systems Review of Systems  Constitutional: Positive for fatigue. Negative for activity change.  Respiratory: Positive for shortness of breath.   Cardiovascular: Negative for chest pain.  Gastrointestinal: Negative for abdominal pain.  Neurological: Positive for dizziness, speech difficulty, weakness and headaches. Negative for facial asymmetry.  All other systems reviewed and are negative.    Physical Exam Updated Vital Signs BP 154/68   Pulse (!) 36   Temp 97.8 F (36.6 C) (Oral)   Resp 13   Ht 5\' 9"  (1.753 m)   Wt 210 lb (95.3 kg)   SpO2 98%   BMI 31.01 kg/m   Physical Exam  Constitutional: He is oriented to person, place, and time. He appears well-developed and well-nourished.  HENT:  Head: Normocephalic and atraumatic.  Eyes: Conjunctivae are normal.  Neck: Neck supple.  Cardiovascular:  No murmur heard. bradycardiac  Pulmonary/Chest: Effort normal and breath sounds normal. No respiratory distress.  Abdominal: Soft. There is no tenderness.  Musculoskeletal: He exhibits no edema.  Neurological: He is alert and oriented to person, place, and time. No cranial nerve deficit. Coordination normal.  Skin: Skin is warm and dry.  Psychiatric: He has a normal mood and affect.  Nursing note and vitals reviewed.    ED Treatments / Results  Labs (all labs ordered are listed, but only abnormal results are displayed) Labs Reviewed  COMPREHENSIVE METABOLIC PANEL  CBC WITH DIFFERENTIAL/PLATELET  PROTIME-INR  URINALYSIS, ROUTINE W REFLEX MICROSCOPIC (NOT AT [facility])  Randolm Idol, ED  I-STAT CG4 LACTIC ACID, ED    EKG  EKG Interpretation  Date/Time:  Friday August 01 2016 09:49:29 EDT Ventricular Rate:  37 PR Interval:    QRS Duration: 155 QT Interval:  540 QTC Calculation: 424 R Axis:   -60 Text Interpretation:  Junctional rhythm RBBB and LAFB Left ventricular hypertrophy Marked sinus  bradycardia ? high grade block- print rhtyhm strip Confirmed by Thomasene Lot, [place] ([id number]) on 08/01/2016 10:13:28 AM       Radiology No results found.  Procedures Procedures (including critical care time)  Medications Ordered in ED Medications  sodium chloride 0.9 % bolus 1,000 mL (not administered)     Initial Impression / Assessment and Plan / ED Course  I have reviewed the triage vital signs and the nursing notes.  Pertinent labs & imaging results that were available during my care of the patient were reviewed by me and considered in my medical decision making (see chart for details).  Clinical Course     75 yo CA man with HTN, CVA, cerebrovascular disease s/p bilateral CEA, ho 1st degree AV block. Patient resents with stroke like symptoms that are now resolving. Considered TIA. However patient is bradycardic on his EKG. We'll print rhythm strip. On rhythm check, it looks  like there is a higher grade AV block. [facility] consult cardiology. Patient's symptoms are now resolved from the TIA. Could consider hypoperfusing of brain secondary to high-grade block.  Recent cath showed right coronary artery -  chronically occluded with left-to-right collaterals. There is significant stenosis in the distal left circumflex at the bifurcation of OM 3/AV groove.   Also sig disease in carotid arteries sp CEA bialterally.     Cards agree with bloc, will take for pacemaker placement.  CRITICAL CARE Performed by: Gardiner Sleeper Total critical care time: 45 minutes Critical care time was exclusive of separately billable procedures and treating other patients. Critical care was necessary to treat or prevent imminent or life-threatening deterioration. Critical care was time spent personally by me on the following activities: development of treatment plan with patient and/or surrogate as well as nursing, discussions with consultants, evaluation of patient's response to treatment, examination of  patient, obtaining history from patient or surrogate, ordering and performing treatments and interventions, ordering and review of laboratory studies, ordering and review of radiographic studies, pulse oximetry and re-evaluation of patient's condition.   Final Clinical Impressions(s) / ED Diagnoses   Final diagnoses:  None    New Prescriptions New Prescriptions   No medications on file     Ayat Drenning Julio Alm, MD 08/02/16 [phone]

## 2016-08-01 NOTE — ED Triage Notes (Addendum)
Pt arrives [facility] with c/o weakness 9am today. EMS states called for stroke symptoms but noted rate and no focal symptoms. Pt states weakness while walking. Pt has all over rash and given benedryl by EMS  PTA.

## 2016-08-01 NOTE — ED Notes (Signed)
MD at bedside. 

## 2016-08-01 NOTE — Discharge Summary (Signed)
ELECTROPHYSIOLOGY PROCEDURE DISCHARGE SUMMARY    Patient ID: [name],  MRN: [id number], DOB/AGE: 02-01-1941 75 y.o.  Admit date: 08/01/2016 Discharge date: 08/02/2016  Primary Care Physician: Monico Blitz, MD  Primary Cardiologist: Dr. Jacinta Shoe   Primary Discharge Diagnosis:  1. CHB     S/p PPM implant this admission  Secondary Discharge Diagnosis:  1. CAD     No c/o CP 2. HTN 3. PVD     Hx of b/l CEA 4. HLD  Allergies  Allergen Reactions  . Strawberry Extract Shortness Of Breath and Swelling  . Ace Inhibitors     POSSIBLE ANGIOEDEMA  . Cardura [Doxazosin Mesylate] Other (See Comments)    Erythema  . Penicillins Rash    RASH    Procedures This Admission:  1.  Implantation of a MDT dual chamber PPM on 08/05/16 by Dr Curt Bears.  The patient received aMedtronic Advisa DR MRI SureScan model [id number] (serial number [id number]) pacemaker Medtronic model [id number] (serial number [id number]) right atrial lead and a Medtronic model 5076 (serial number [id number]) right ventricular lead  There were no immediate post procedure complications. 2.  CXR on 08/02/2016 demonstrated no pneumothorax status post device implantation.   Brief HPI: [name] is a 75 y.o. male was came to the ER via EMS with a near syncopal event associated with slurred/slow speech found in CHB, rates 30's.  Hospital Course:  The patient PMHx of known severe CAD, last cath noted below, no intervention, treated medically, PVD with b/l CEA in 2014, HTN, HLD. This event was witnessed by his friend (a retired [employer] administrator) she reports him as initally very tachycardic then suddenly pale, diaphoretic and profoundly weak, was able to help him ambulate back into the house from the yard, sat him in the chair and slowly he improved.  He denies any focal weakness other then the speech, remained oriented and never had LOC.  He had some mild central chest discomfort that self resolved, and remains without CP, no  overt feeling of SOB, no syncope.  EMS was called and noted the patient bradycardic, strips reveal CHB 30's.  He is known to have LBBB at baseline, his escape rhythm was a RBBB.  His symptoms of speech difficulty and transient CP felt to be secondary to his profound bradycardia and CHB.  No reversible causes for CHB were identified. He was admitted and underwent implantation of a PPM with details as outlined above.  He was monitored on telemetry overnight which demonstrated NSR with ventricular pacing.  Left chest was without hematoma or ecchymosis. The device was interrogated and found to be functioning normally.  CXR was obtained and demonstrated no pneumothorax status post device implantation. Wound care, arm mobility, and restrictions were reviewed with the patient.  The patient was examined by Dr. Lovena Le and considered stable for discharge to home.   Physical Exam: Vitals:   08/01/16 1533 08/01/16 2039 08/02/16 0500 08/02/16 0919  BP: (!) 137/100 (!) 150/83 130/85 125/77  Pulse: 83 80 80   Resp: 20 20 19    Temp:  98 F (36.7 C) 97.9 F (36.6 C)   TempSrc:  Oral Oral   SpO2: 94% 97% 95%   Weight:      Height:        Physical Exam: GEN- The patient is well appearing, alert and oriented x 3 today.   HEENT: normocephalic, atraumatic; sclera clear, conjunctiva pink; hearing intact; oropharynx clear; neck supple, no JVP Lungs- Clear to  ausculation bilaterally, normal work of breathing.  No wheezes, rales, rhonchi Heart- Regular rate and rhythm, no murmurs, rubs or gallops, PMI not laterally displaced. PPM with no hematoma or ecchymosis.  GI- soft, non-tender, non-distended, bowel sounds present, no hepatosplenomegaly Extremities- no clubbing, cyanosis, or edema; DP/PT/radial pulses 2+ bilaterally MS- no significant deformity or atrophy Skin- warm and dry, no rash or lesion, left chest without hematoma or ecchymosis.  Psych- euthymic mood, full affect Neuro- no gross  deficits   Labs: Lab Results  Component Value Date   WBC 12.2 (H) 08/01/2016   HGB 15.2 08/01/2016   HCT 45.7 08/01/2016   MCV 92.3 08/01/2016   PLT 273 08/01/2016     Recent Labs Lab 08/01/16 1010  NA 140  K 3.5  CL 98*  CO2 30  BUN 24*  CREATININE 1.39*  CALCIUM 9.9  PROT 7.8  BILITOT 1.0  ALKPHOS 69  ALT 19  AST 28  GLUCOSE 126*    Discharge Medications:    Medication List    TAKE these medications   amLODipine 10 MG tablet Commonly known as:  NORVASC Take 10 mg by mouth daily.   aspirin 81 MG tablet Take 81 mg by mouth daily.   atorvastatin 40 MG tablet Commonly known as:  LIPITOR Take 1 tablet (40 mg total) by mouth daily.   finasteride 5 MG tablet Commonly known as:  PROSCAR Take 5 mg by mouth daily.   hydrochlorothiazide 25 MG tablet Commonly known as:  HYDRODIURIL Take 25 mg by mouth daily.       Disposition:  Home Discharge Instructions    Increase activity slowly    Complete by:  As directed      Follow-up Information    [facility] Follow up on 08/15/2016.   Specialty:  Cardiology Why:  10:00AM, wound check Contact information: [address], [place] [phone]          Duration of Discharge Encounter: Greater than 30 minutes including physician time.  Signed, Erma Heritage, PA-C 08/02/2016, 9:48 AM Pager: [phone]  EP Attending  Patient seen and examined. Agree with above.  Cristopher Peru, [name]

## 2016-08-02 ENCOUNTER — Inpatient Hospital Stay (HOSPITAL_COMMUNITY): Payer: Medicare Other

## 2016-08-02 MED ORDER — DIPHENHYDRAMINE HCL 12.5 MG/5ML PO ELIX: 12.5000 mg | ORAL_SOLUTION | Freq: Four times a day (QID) | ORAL | Status: DC | PRN

## 2016-08-02 MED ORDER — ATORVASTATIN CALCIUM 40 MG PO TABS: 40.0000 mg | ORAL_TABLET | Freq: Every day | ORAL | 6 refills | Status: AC

## 2016-08-02 NOTE — Plan of Care (Signed)
Problem: Education: Goal: Knowledge of [facility] Education information/materials will improve Outcome: Progressing Reviewed rules of the unit especially pertaining to safety. Patient instructed to call if he needs assistance and he verbalized understanding.

## 2016-08-02 NOTE — Progress Notes (Signed)
Patient to X-ray via bed, on monitor in stable condition, will await his return and patient is now requesting more Benadryl IV for itching, will administer it when he returns from his test, VSS and dressing remains C,D,I.

## 2016-08-02 NOTE — Progress Notes (Signed)
Report received in patient's room via [place] RN using SBAR format, reviewed orders, labs, VS, meds and patient's general condition, assumed care of patient.

## 2016-08-02 NOTE — Progress Notes (Signed)
Patient Name: [name] Date of Encounter: 08/02/2016     Active Problems:   Complete heart block ([place])    SUBJECTIVE  No chest pain or sob. No edema. Minimal soreness at PPM insertion site.  CURRENT MEDS . amLODipine  10 mg Oral Daily  . aspirin EC  81 mg Oral Daily  . atorvastatin  40 mg Oral Daily  . finasteride  5 mg Oral Daily  . hydrochlorothiazide  25 mg Oral Daily    OBJECTIVE  Vitals:   08/01/16 1520 08/01/16 1533 08/01/16 2039 08/02/16 0500  BP: (!) 146/83 (!) 137/100 (!) 150/83 130/85  Pulse: 75 83 80 80  Resp: 13 20 20 19   Temp:   98 F (36.7 C) 97.9 F (36.6 C)  TempSrc:   Oral Oral  SpO2: 99% 94% 97% 95%  Weight:      Height:        Intake/Output Summary (Last 24 hours) at 08/02/16 0919 Last data filed at 08/02/16 0823  Gross per 24 hour  Intake              250 ml  Output             3025 ml  Net            -2775 ml   Filed Weights   08/01/16 0956  Weight: 210 lb (95.3 kg)    PHYSICAL EXAM  General: Pleasant, NAD. Neuro: Alert and oriented X 3. Moves all extremities spontaneously. Psych: Normal affect. HEENT:  Normal  Neck: Supple without bruits or JVD. Lungs:  Resp regular and unlabored, CTA. PPM with no hematoma or ecchymosis Heart: RRR no s3, s4, or murmurs. Abdomen: Soft, non-tender, non-distended, BS + x 4.  Extremities: No clubbing, cyanosis or edema. DP/PT/Radials 2+ and equal bilaterally.  Accessory Clinical Findings  CBC  Recent Labs  08/01/16 1010  WBC 12.2*  NEUTROABS 8.4*  HGB 15.2  HCT 45.7  MCV 92.3  PLT 123456   Basic Metabolic Panel  Recent Labs  08/01/16 1010  NA 140  K 3.5  CL 98*  CO2 30  GLUCOSE 126*  BUN 24*  CREATININE 1.39*  CALCIUM 9.9   Liver Function Tests  Recent Labs  08/01/16 1010  AST 28  ALT 19  ALKPHOS 69  BILITOT 1.0  PROT 7.8  ALBUMIN 4.3   No results for input(s): LIPASE, AMYLASE in the last 72 hours. Cardiac Enzymes No results for input(s): CKTOTAL, CKMB,  CKMBINDEX, TROPONINI in the last 72 hours. BNP Invalid input(s): POCBNP D-Dimer No results for input(s): DDIMER in the last 72 hours. Hemoglobin A1C No results for input(s): HGBA1C in the last 72 hours. Fasting Lipid Panel No results for input(s): CHOL, HDL, LDLCALC, TRIG, CHOLHDL, LDLDIRECT in the last 72 hours. Thyroid Function Tests No results for input(s): TSH, T4TOTAL, T3FREE, THYROIDAB in the last 72 hours.  Invalid input(s): FREET3  TELE  NSR with ventricular pacing  Radiology/Studies  Dg Chest 2 View  Result Date: 08/02/2016 CLINICAL DATA:  Cardiac arrhythmia with pacemaker insertion EXAM: CHEST  2 VIEW COMPARISON:  August 01, 2016 FINDINGS: Pacemaker is present on the left with lead tips attached to the right atrium and right ventricle. No pneumothorax. There is no edema or consolidation. Heart is upper normal in size with pulmonary vascularity within normal limits. There is no adenopathy. There is mild degenerative change in the thoracic spine. IMPRESSION: Pacemaker leads attached to right atrium and right ventricle. No pneumothorax.  No edema or consolidation. Stable cardiac silhouette. Electronically Signed   By: Lowella Grip III [name]   On: 08/02/2016 07:30   Dg Chest Portable 1 View  Result Date: 08/01/2016 CLINICAL DATA:  Chest pain and SOB since yesterday - pt has a hx of carotid issues, stroke, htn - unknown lung issues EXAM: PORTABLE CHEST 1 VIEW COMPARISON:  10/30/2015 FINDINGS: External pacer/ defibrillator. Midline trachea. Mild cardiomegaly. No pleural effusion or pneumothorax. No congestive failure. Clear lungs. Patient minimally rotated right. IMPRESSION: Cardiomegaly without congestive failure. Electronically Signed   By: Abigail Miyamoto [name]   On: 08/01/2016 11:06    ASSESSMENT AND PLAN  1. CHB 2. S/p PPM Rec: ok for dc home. He will follow up as per usual. Encouraged the patient to maintain a low sodium diet. Keep incision dry for a week.  [name],[name]  08/02/2016 9:19 AMPatient ID: [name], male   DOB: 17-Aug-1941, 75 y.o.   MRN: [id number]

## 2016-08-04 MED FILL — Gentamicin Sulfate Inj 40 MG/ML: INTRAMUSCULAR | Qty: 2 | Status: AC

## 2016-08-04 MED FILL — Sodium Chloride Irrigation Soln 0.9%: Qty: 500 | Status: AC

## 2016-08-12 ENCOUNTER — Encounter (HOSPITAL_COMMUNITY): Payer: Self-pay | Admitting: Emergency Medicine

## 2016-08-12 DIAGNOSIS — F419 Anxiety disorder, unspecified: Secondary | ICD-10-CM | POA: Insufficient documentation

## 2016-08-12 DIAGNOSIS — Z950 Presence of cardiac pacemaker: Secondary | ICD-10-CM | POA: Diagnosis not present

## 2016-08-12 DIAGNOSIS — I442 Atrioventricular block, complete: Secondary | ICD-10-CM | POA: Insufficient documentation

## 2016-08-12 DIAGNOSIS — N182 Chronic kidney disease, stage 2 (mild): Secondary | ICD-10-CM | POA: Insufficient documentation

## 2016-08-12 DIAGNOSIS — N179 Acute kidney failure, unspecified: Secondary | ICD-10-CM | POA: Insufficient documentation

## 2016-08-12 DIAGNOSIS — I739 Peripheral vascular disease, unspecified: Secondary | ICD-10-CM | POA: Diagnosis not present

## 2016-08-12 DIAGNOSIS — D72829 Elevated white blood cell count, unspecified: Secondary | ICD-10-CM | POA: Insufficient documentation

## 2016-08-12 DIAGNOSIS — I447 Left bundle-branch block, unspecified: Secondary | ICD-10-CM | POA: Diagnosis not present

## 2016-08-12 DIAGNOSIS — D124 Benign neoplasm of descending colon: Secondary | ICD-10-CM | POA: Insufficient documentation

## 2016-08-12 DIAGNOSIS — E785 Hyperlipidemia, unspecified: Secondary | ICD-10-CM | POA: Diagnosis not present

## 2016-08-12 DIAGNOSIS — Z880 Allergy status to penicillin: Secondary | ICD-10-CM | POA: Diagnosis not present

## 2016-08-12 DIAGNOSIS — F329 Major depressive disorder, single episode, unspecified: Secondary | ICD-10-CM | POA: Insufficient documentation

## 2016-08-12 DIAGNOSIS — K648 Other hemorrhoids: Secondary | ICD-10-CM | POA: Diagnosis not present

## 2016-08-12 DIAGNOSIS — K921 Melena: Secondary | ICD-10-CM | POA: Diagnosis not present

## 2016-08-12 DIAGNOSIS — Z8673 Personal history of transient ischemic attack (TIA), and cerebral infarction without residual deficits: Secondary | ICD-10-CM | POA: Insufficient documentation

## 2016-08-12 DIAGNOSIS — I2510 Atherosclerotic heart disease of native coronary artery without angina pectoris: Secondary | ICD-10-CM | POA: Diagnosis not present

## 2016-08-12 DIAGNOSIS — Z888 Allergy status to other drugs, medicaments and biological substances status: Secondary | ICD-10-CM | POA: Insufficient documentation

## 2016-08-12 DIAGNOSIS — N400 Enlarged prostate without lower urinary tract symptoms: Secondary | ICD-10-CM | POA: Diagnosis not present

## 2016-08-12 DIAGNOSIS — I129 Hypertensive chronic kidney disease with stage 1 through stage 4 chronic kidney disease, or unspecified chronic kidney disease: Secondary | ICD-10-CM | POA: Insufficient documentation

## 2016-08-12 DIAGNOSIS — Z7982 Long term (current) use of aspirin: Secondary | ICD-10-CM | POA: Insufficient documentation

## 2016-08-12 DIAGNOSIS — K644 Residual hemorrhoidal skin tags: Secondary | ICD-10-CM | POA: Insufficient documentation

## 2016-08-12 DIAGNOSIS — M1990 Unspecified osteoarthritis, unspecified site: Secondary | ICD-10-CM | POA: Insufficient documentation

## 2016-08-12 DIAGNOSIS — K625 Hemorrhage of anus and rectum: Secondary | ICD-10-CM | POA: Diagnosis present

## 2016-08-12 DIAGNOSIS — R42 Dizziness and giddiness: Secondary | ICD-10-CM | POA: Insufficient documentation

## 2016-08-12 DIAGNOSIS — Z91018 Allergy to other foods: Secondary | ICD-10-CM | POA: Insufficient documentation

## 2016-08-12 NOTE — ED Triage Notes (Signed)
Pt. reports rectal bleeding onset this afternoon , denies injury , no abdominal pain or fever. Pt. had a recent pacemaker implant last Friday .

## 2016-08-13 ENCOUNTER — Observation Stay (HOSPITAL_COMMUNITY)
Admission: EM | Admit: 2016-08-13 | Discharge: 2016-08-15 | Disposition: A | Discharge status: Home / Self Care | Payer: Medicare Other | Attending: Internal Medicine | Admitting: Internal Medicine

## 2016-08-13 ENCOUNTER — Encounter (HOSPITAL_COMMUNITY): Payer: Self-pay | Admitting: Internal Medicine

## 2016-08-13 ENCOUNTER — Observation Stay (HOSPITAL_COMMUNITY): Payer: Medicare Other

## 2016-08-13 DIAGNOSIS — I10 Essential (primary) hypertension: Secondary | ICD-10-CM

## 2016-08-13 DIAGNOSIS — G459 Transient cerebral ischemic attack, unspecified: Secondary | ICD-10-CM | POA: Diagnosis present

## 2016-08-13 DIAGNOSIS — I442 Atrioventricular block, complete: Secondary | ICD-10-CM | POA: Diagnosis not present

## 2016-08-13 DIAGNOSIS — N182 Chronic kidney disease, stage 2 (mild): Secondary | ICD-10-CM

## 2016-08-13 DIAGNOSIS — D72829 Elevated white blood cell count, unspecified: Secondary | ICD-10-CM | POA: Diagnosis present

## 2016-08-13 DIAGNOSIS — I2583 Coronary atherosclerosis due to lipid rich plaque: Secondary | ICD-10-CM

## 2016-08-13 DIAGNOSIS — N179 Acute kidney failure, unspecified: Secondary | ICD-10-CM | POA: Insufficient documentation

## 2016-08-13 DIAGNOSIS — D124 Benign neoplasm of descending colon: Secondary | ICD-10-CM | POA: Diagnosis not present

## 2016-08-13 DIAGNOSIS — I679 Cerebrovascular disease, unspecified: Secondary | ICD-10-CM

## 2016-08-13 DIAGNOSIS — K625 Hemorrhage of anus and rectum: Secondary | ICD-10-CM | POA: Diagnosis not present

## 2016-08-13 DIAGNOSIS — K922 Gastrointestinal hemorrhage, unspecified: Secondary | ICD-10-CM | POA: Diagnosis present

## 2016-08-13 DIAGNOSIS — R42 Dizziness and giddiness: Secondary | ICD-10-CM

## 2016-08-13 DIAGNOSIS — F419 Anxiety disorder, unspecified: Secondary | ICD-10-CM | POA: Diagnosis present

## 2016-08-13 DIAGNOSIS — K648 Other hemorrhoids: Secondary | ICD-10-CM | POA: Diagnosis not present

## 2016-08-13 DIAGNOSIS — I2510 Atherosclerotic heart disease of native coronary artery without angina pectoris: Secondary | ICD-10-CM | POA: Diagnosis present

## 2016-08-13 HISTORY — DX: Atherosclerotic heart disease of native coronary artery without angina pectoris: I25.10

## 2016-08-13 HISTORY — DX: Presence of cardiac pacemaker: Z95.0

## 2016-08-13 HISTORY — DX: Adjustment disorder with depressed mood: F43.21

## 2016-08-13 HISTORY — DX: Acute myocardial infarction, unspecified: I21.9

## 2016-08-13 HISTORY — DX: Hemorrhage of anus and rectum: K62.5

## 2016-08-13 LAB — URINALYSIS, ROUTINE W REFLEX MICROSCOPIC
Bilirubin Urine: NEGATIVE
GLUCOSE, UA: NEGATIVE mg/dL
HGB URINE DIPSTICK: NEGATIVE
KETONES UR: NEGATIVE mg/dL
LEUKOCYTES UA: NEGATIVE
Nitrite: NEGATIVE
PROTEIN: NEGATIVE mg/dL
Specific Gravity, Urine: 1.018 (ref 1.005–1.030)
pH: 6 (ref 5.0–8.0)

## 2016-08-13 LAB — POC OCCULT BLOOD, ED: FECAL OCCULT BLD: POSITIVE — AB

## 2016-08-13 LAB — COMPREHENSIVE METABOLIC PANEL
ALT: 23 U/L (ref 17–63)
AST: 31 U/L (ref 15–41)
Albumin: 4 g/dL (ref 3.5–5.0)
Alkaline Phosphatase: 75 U/L (ref 38–126)
Anion gap: 11 (ref 5–15)
BILIRUBIN TOTAL: 0.7 mg/dL (ref 0.3–1.2)
BUN: 30 mg/dL — AB (ref 6–20)
CO2: 27 mmol/L (ref 22–32)
CREATININE: 1.36 mg/dL — AB (ref 0.61–1.24)
Calcium: 9.6 mg/dL (ref 8.9–10.3)
Chloride: 98 mmol/L — ABNORMAL LOW (ref 101–111)
GFR, EST AFRICAN AMERICAN: 56 mL/min — AB (ref >60)
GFR, EST NON AFRICAN AMERICAN: 49 mL/min — AB (ref >60)
Glucose, Bld: 133 mg/dL — ABNORMAL HIGH (ref 65–99)
POTASSIUM: 3.6 mmol/L (ref 3.5–5.1)
Sodium: 136 mmol/L (ref 135–145)
TOTAL PROTEIN: 7.1 g/dL (ref 6.5–8.1)

## 2016-08-13 LAB — CBC
HCT: 41.2 % (ref 39.0–52.0)
HEMATOCRIT: 41.1 % (ref 39.0–52.0)
HEMOGLOBIN: 14.1 g/dL (ref 13.0–17.0)
HEMOGLOBIN: 14.5 g/dL (ref 13.0–17.0)
MCH: 30.6 pg (ref 26.0–34.0)
MCH: 31.5 pg (ref 26.0–34.0)
MCHC: 34.3 g/dL (ref 30.0–36.0)
MCHC: 35.2 g/dL (ref 30.0–36.0)
MCV: 89.2 fL (ref 78.0–100.0)
MCV: 89.4 fL (ref 78.0–100.0)
PLATELETS: 247 10*3/uL (ref 150–400)
Platelets: 262 10*3/uL (ref 150–400)
RBC: 4.61 MIL/uL (ref 4.22–5.81)
RBC: 4.61 MIL/uL (ref 4.22–5.81)
RDW: 12.5 % (ref 11.5–15.5)
RDW: 12.5 % (ref 11.5–15.5)
WBC: 15.5 10*3/uL — AB (ref 4.0–10.5)
WBC: 14.9 10*3/uL — ABNORMAL HIGH (ref 4.0–10.5)

## 2016-08-13 LAB — CBC WITH DIFFERENTIAL/PLATELET
BASOS ABS: 0.1 10*3/uL (ref 0.0–0.1)
Basophils Relative: 1 %
EOS PCT: 5 %
Eosinophils Absolute: 0.8 10*3/uL — ABNORMAL HIGH (ref 0.0–0.7)
HEMATOCRIT: 42 % (ref 39.0–52.0)
Hemoglobin: 14.7 g/dL (ref 13.0–17.0)
LYMPHS ABS: 4 10*3/uL (ref 0.7–4.0)
LYMPHS PCT: 24 %
MCH: 31.3 pg (ref 26.0–34.0)
MCHC: 35 g/dL (ref 30.0–36.0)
MCV: 89.4 fL (ref 78.0–100.0)
MONO ABS: 1.6 10*3/uL — AB (ref 0.1–1.0)
MONOS PCT: 9 %
NEUTROS ABS: 10.5 10*3/uL — AB (ref 1.7–7.7)
Neutrophils Relative %: 62 %
PLATELETS: 296 10*3/uL (ref 150–400)
RBC: 4.7 MIL/uL (ref 4.22–5.81)
RDW: 12.3 % (ref 11.5–15.5)
WBC: 17 10*3/uL — ABNORMAL HIGH (ref 4.0–10.5)

## 2016-08-13 LAB — SAMPLE TO BLOOD BANK

## 2016-08-13 MED ORDER — ONDANSETRON HCL 4 MG PO TABS: 4.0000 mg | ORAL_TABLET | Freq: Four times a day (QID) | ORAL | Status: DC | PRN

## 2016-08-13 MED ORDER — ACETAMINOPHEN 650 MG RE SUPP: 650.0000 mg | Freq: Four times a day (QID) | RECTAL | Status: DC | PRN

## 2016-08-13 MED ORDER — PANTOPRAZOLE SODIUM 40 MG IV SOLR
8.0000 mg/h | INTRAVENOUS | Status: DC
  Administered 2016-08-13: 8 mg/h via INTRAVENOUS
  Filled 2016-08-13: qty 80

## 2016-08-13 MED ORDER — ACETAMINOPHEN 325 MG PO TABS: 650.0000 mg | ORAL_TABLET | Freq: Four times a day (QID) | ORAL | Status: DC | PRN

## 2016-08-13 MED ORDER — ONDANSETRON HCL 4 MG/2ML IJ SOLN: 4.0000 mg | Freq: Four times a day (QID) | INTRAMUSCULAR | Status: DC | PRN

## 2016-08-13 MED ORDER — ATORVASTATIN CALCIUM 40 MG PO TABS
40.0000 mg | ORAL_TABLET | Freq: Every day | ORAL | Status: DC
Start: 2016-08-13 — End: 2016-08-15
  Administered 2016-08-13 – 2016-08-15 (×3): 40 mg via ORAL
  Filled 2016-08-13 (×4): qty 1

## 2016-08-13 MED ORDER — PEG-KCL-NACL-NASULF-NA ASC-C 100 G PO SOLR
1.0000 | Freq: Once | ORAL | Status: AC
  Administered 2016-08-13: 200 g via ORAL
  Filled 2016-08-13: qty 1

## 2016-08-13 MED ORDER — SODIUM CHLORIDE 0.9% FLUSH
3.0000 mL | Freq: Two times a day (BID) | INTRAVENOUS | Status: DC
  Administered 2016-08-14 – 2016-08-15 (×2): 3 mL via INTRAVENOUS

## 2016-08-13 MED ORDER — SODIUM CHLORIDE 0.9 % IV SOLN
INTRAVENOUS | Status: DC
  Administered 2016-08-13: 05:00:00 via INTRAVENOUS
  Administered 2016-08-14: 1000 mL via INTRAVENOUS
  Administered 2016-08-14: 500 mL via INTRAVENOUS

## 2016-08-13 MED ORDER — FINASTERIDE 5 MG PO TABS
5.0000 mg | ORAL_TABLET | Freq: Every day | ORAL | Status: DC
  Administered 2016-08-13 – 2016-08-15 (×3): 5 mg via ORAL
  Filled 2016-08-13 (×4): qty 1

## 2016-08-13 MED ORDER — AMLODIPINE BESYLATE 10 MG PO TABS
10.0000 mg | ORAL_TABLET | Freq: Every day | ORAL | Status: DC
  Administered 2016-08-13 – 2016-08-15 (×3): 10 mg via ORAL
  Filled 2016-08-13: qty 2
  Filled 2016-08-13 (×3): qty 1

## 2016-08-13 NOTE — ED Provider Notes (Signed)
[facility] DEPT Provider Note   CSN: [id number] Arrival date & time: 08/12/16  2326  By signing my name below, I, Dolores Hoose, attest that this documentation has been prepared under the direction and in the presence of Ripley Fraise, MD . Electronically Signed: Dolores Hoose, Scribe. 08/13/2016. 3:54 AM.  History   Chief Complaint Chief Complaint  Patient presents with  . Rectal Bleeding   The history is provided by the patient. No language interpreter was used.  Rectal Bleeding  Quality:  Bright red Amount:  Moderate Timing:  Constant Chronicity:  New Context: spontaneously   Similar prior episodes: yes   Relieved by:  Nothing Worsened by:  Nothing Ineffective treatments:  None tried Associated symptoms: dizziness   Associated symptoms: no abdominal pain and no vomiting      HPI Comments:  [name] is a 75 y.o. male who presents to the Emergency Department complaining of sudden-onset unchanged bright red rectal bleeding beginning yesterday. He notes that he has had symptoms like this in the past when he was taking plavix. Pt reports associated dizziness. He denies any chest pain, syncope, abdominal pain or vomiting. Pt has had a pacemaker implanted last week, no complications reported  Past Medical History:  Diagnosis Date  . Anxiety   . Arthritis   . Backache, unspecified   . BPH (benign prostatic hypertrophy)   . Carotid artery occlusion   . Cerebrovascular disease, unspecified   . Cervicalgia   . Depressive disorder, not elsewhere classified   . Enlarged prostate   . Hypertension   . Inguinal hernia without mention of obstruction or gangrene, unilateral or unspecified, (not specified as recurrent)   . Other psoriasis   . Pain in limb   . Stroke ([place])   . Unspecified hemorrhoids without mention of complication   . Unspecified transient cerebral ischemia   . Varicose veins     Patient Active Problem List   Diagnosis Date Noted  . Complete  heart block ([place]) 08/01/2016  . Dizziness 06/02/2015  . LBBB (left bundle branch block) 06/02/2015  . Aftercare following surgery of the circulatory system, [place] 11/10/2013  . Carotid stenosis 10/13/2013  . Occlusion and stenosis of carotid artery without mention of cerebral infarction 07/04/2013    Past Surgical History:  Procedure Laterality Date  . BACK SURGERY    . CARDIAC CATHETERIZATION N/A 06/06/2015   Procedure: Left Heart Cath and Coronary Angiography;  Surgeon: Wellington Hampshire, MD;  Location: [facility];  Service: Cardiovascular;  Laterality: N/A;  . CAROTID ENDARTERECTOMY Right 10-13-13   cea  . CAROTID ENDARTERECTOMY Left 07-14-13   cea  . COLONOSCOPY    . ENDARTERECTOMY Left 07/14/2013   Procedure: ENDARTERECTOMY CAROTID-LEFT;  Surgeon: Serafina Mitchell, MD;  Location: [facility];  Service: Vascular;  Laterality: Left;  . ENDARTERECTOMY Right 10/13/2013   Procedure: RIGHT CAROTID ARTERY ENDARTERECTOMY WITH VASCU GUARD PATCH ANGIOPLASTY;  Surgeon: Serafina Mitchell, MD;  Location: [facility];  Service: Vascular;  Laterality: Right;  . EP IMPLANTABLE DEVICE N/A 08/01/2016   Procedure: Pacemaker Implant;  Surgeon: Will Meredith Leeds, MD;  Location: [facility];  Service: Cardiovascular;  Laterality: N/A;  . HERNIA REPAIR    . PATCH ANGIOPLASTY Left 07/14/2013   Procedure: PATCH ANGIOPLASTY using Vascu-Guard Vascular Patch;  Surgeon: Serafina Mitchell, MD;  Location: [facility];  Service: Vascular;  Laterality: Left;  . SPINE SURGERY  1992   Dr. Amado Coe  . VASECTOMY  Home Medications    Prior to Admission medications   Medication Sig Start Date End Date Taking? Authorizing Provider  amLODipine (NORVASC) 10 MG tablet Take 10 mg by mouth daily.    Historical Provider, MD  aspirin 81 MG tablet Take 81 mg by mouth daily.    Historical Provider, MD  atorvastatin (LIPITOR) 40 MG tablet Take 1 tablet (40 mg total) by mouth daily. 08/02/16   Erma Heritage, PA    finasteride (PROSCAR) 5 MG tablet Take 5 mg by mouth daily.    Historical Provider, MD  hydrochlorothiazide (HYDRODIURIL) 25 MG tablet Take 25 mg by mouth daily.    Historical Provider, MD    Family History Family History  Problem Relation Age of Onset  . Heart attack Father   . Cancer Daughter     Social History Social History  Substance Use Topics  . Smoking status: Never Smoker  . Smokeless tobacco: Never Used  . Alcohol use No     Allergies   Strawberry extract; Ace inhibitors; Cardura [doxazosin mesylate]; and Penicillins   Review of Systems Review of Systems  Cardiovascular: Negative for chest pain.  Gastrointestinal: Positive for hematochezia. Negative for abdominal pain and vomiting.       Positive for Rectal bleeding  Neurological: Positive for dizziness. Negative for syncope.  All other systems reviewed and are negative.    Physical Exam Updated Vital Signs BP 131/91   Pulse 61   Temp 97.8 F (36.6 C) (Oral)   Resp 15   SpO2 98%   Physical Exam CONSTITUTIONAL: Well developed/well nourished HEAD: Normocephalic/atraumatic EYES: EOMI ENMT: Mucous membranes moist NECK: supple no meningeal signs SPINE/BACK:entire spine nontender CV: S1/S2 noted, no murmurs/rubs/gallops noted LUNGS: Lungs are clear to auscultation bilaterally, no apparent distress ABDOMEN: soft, nontender, no rebound or guarding, bowel sounds noted throughout abdomen GU:no cva tenderness Rectal: Blood tinged stool noted. No melena noted.  Nurse chaperone present for exam NEURO: Pt is awake/alert/appropriate, moves all extremitiesx4.  No facial droop.   EXTREMITIES: pulses normal/equal, full ROM SKIN: warm, color normal, pacemaker site noted, no bleeding/discharge noted, no erythema noted PSYCH: no abnormalities of mood noted, alert and oriented to situation   ED Treatments / Results  DIAGNOSTIC STUDIES:  Oxygen Saturation is 98% on RA, normal by my interpretation.     COORDINATION OF CARE:  3:56 AM Discussed treatment plan with pt at bedside which includes admittance to hospital and pt agreed to plan.  Labs (all labs ordered are listed, but only abnormal results are displayed) Labs Reviewed  CBC WITH DIFFERENTIAL/PLATELET - Abnormal; Notable for the following:       Result Value   WBC 17.0 (*)    Neutro Abs 10.5 (*)    Monocytes Absolute 1.6 (*)    Eosinophils Absolute 0.8 (*)    All other components within normal limits  COMPREHENSIVE METABOLIC PANEL - Abnormal; Notable for the following:    Chloride 98 (*)    Glucose, Bld 133 (*)    BUN 30 (*)    Creatinine, Ser 1.36 (*)    GFR calc non Af Amer 49 (*)    GFR calc Af Amer 56 (*)    All other components within normal limits  POC OCCULT BLOOD, ED  SAMPLE TO BLOOD BANK    EKG  EKG Interpretation None       Radiology No results found.  Procedures Procedures (including critical care time)  Medications Ordered in ED Medications - No data to display  Initial Impression / Assessment and Plan / ED Course  I have reviewed the triage vital signs and the nursing notes.  Pertinent labs results that were available during my care of the patient were reviewed by me and considered in my medical decision making (see chart for details).  Clinical Course    Pt stable He is here with acute rectal bleeding Currently hemodynamically appropriate However given his age, also amount of bleeding he reported at home, I advised admission Pt agreeable with plan D/w dr Blaine Hamper for admission  It appears patient is only on ASA at this time Final Clinical Impressions(s) / ED Diagnoses   Final diagnoses:  Rectal bleeding  Acute GI bleeding    New Prescriptions New Prescriptions   No medications on file  I personally performed the services described in this documentation, which was scribed in my presence. The recorded information has been reviewed and is accurate.        Ripley Fraise,  MD 08/13/16 [phone]

## 2016-08-13 NOTE — ED Notes (Signed)
Meal delivered, cut food for pt as he was unable to do so.

## 2016-08-13 NOTE — Progress Notes (Signed)
This is a no charge note  Pending admission per Dr. Christy Gentles.  75 year old male with past medical history of hypertension, hyperlipidemia, depression, anxiety, TIA, stroke, psoriasis, BPH, chronic back pain, recent pacemaker placement on Friday due to complete heart block, on aspirin, who presents with rectal bleeding. Hemoglobin 14.7. No tachycardia or tachypnea. No abdominal pain or chest pain. Pt is accepted to med-surg bed for obs  Ivor Costa, MD  [facility] Pager [phone]  If 7PM-7AM, please contact night-coverage [email or web address] Password [email or web address] 08/13/2016, 4:29 AM

## 2016-08-13 NOTE — ED Notes (Signed)
Pt reports that he was bleeding so much from his rectum that it soaked through his shorts and was on the floor.

## 2016-08-13 NOTE — Progress Notes (Signed)
   08/13/16 1630  Clinical Encounter Type  Visited With Patient  Visit Type Other (Comment) (Consult)  Referral From Patient  Consult/Referral To Chaplain  Spiritual Encounters  Spiritual Needs Prayer;Emotional  Stress Factors  Patient Stress Factors Health changes  Consult for prayer. Prayed with Pt. Spoke about the scriptures and family members who have died and those still living. Offered emotional support.

## 2016-08-13 NOTE — ED Notes (Signed)
2nd attempt to call report. Will call back

## 2016-08-13 NOTE — H&P (Signed)
History and Physical    [name] [id number] DOB: 05/26/1941 DOA: 08/13/2016  PCP: Monico Blitz, MD Patient coming from: home in [place]  Chief Complaint: BRBPR  HPI: Timothy [name] is a pleasant 75 y.o. male with medical history significant for known severe CAD treated medically, left bundle branch block pacemaker inserted last week as a result of complete heart block, PVD, hypertension, hyperlipidemia, presents to the emergency department with 2 episodes of bright red blood per rectum. Initial evaluation by emergency department physician who opined to admission given his age and the amount of bleeding reported at home.  Information is obtained from the patient. He reports 2 episodes of bright red blood per rectum yesterday evening. He denies any clots abdominal pain nausea vomiting. He reports having had a normal bowel movement several hours prior to these events. Denies melena. He denies chest pain palpitations headache dizziness syncope or near-syncope. He denies shortness of breath cough lower extremity edema. He denies dysuria hematuria frequency or urgency. He takes 81 mg of aspirin. He denies NSAID use. He reports having had a colonoscopy greater than 5 years ago in [facility]. He denies having seen a gastroenterologist in [place]. He's had no further bleeding during his time in the emergency department.    ED Course: In the emergency department he is afebrile hemodynamically stable not hypoxic. He is provided with IV fluids and IV protoniX  Review of Systems: As per HPI otherwise 10 point review of systems negative.   Ambulatory Status: He ambulates independently with a steady gait no recent falls  Past Medical History:  Diagnosis Date  . Anxiety   . Arthritis   . Backache, unspecified   . BPH (benign prostatic hypertrophy)   . CAD (coronary artery disease) 08/13/2016  . Carotid artery occlusion   . Cerebrovascular disease, unspecified   . Cervicalgia   .  Depressive disorder, not elsewhere classified   . Enlarged prostate   . Hypertension   . Inguinal hernia without mention of obstruction or gangrene, unilateral or unspecified, (not specified as recurrent)   . Other psoriasis   . Pain in limb   . Stroke ([place])   . Unspecified hemorrhoids without mention of complication   . Unspecified transient cerebral ischemia   . Varicose veins     Past Surgical History:  Procedure Laterality Date  . BACK SURGERY    . CARDIAC CATHETERIZATION N/A 06/06/2015   Procedure: Left Heart Cath and Coronary Angiography;  Surgeon: Wellington Hampshire, MD;  Location: [facility];  Service: Cardiovascular;  Laterality: N/A;  . CAROTID ENDARTERECTOMY Right 10-13-13   cea  . CAROTID ENDARTERECTOMY Left 07-14-13   cea  . COLONOSCOPY    . ENDARTERECTOMY Left 07/14/2013   Procedure: ENDARTERECTOMY CAROTID-LEFT;  Surgeon: Serafina Mitchell, MD;  Location: [facility];  Service: Vascular;  Laterality: Left;  . ENDARTERECTOMY Right 10/13/2013   Procedure: RIGHT CAROTID ARTERY ENDARTERECTOMY WITH VASCU GUARD PATCH ANGIOPLASTY;  Surgeon: Serafina Mitchell, MD;  Location: [facility];  Service: Vascular;  Laterality: Right;  . EP IMPLANTABLE DEVICE N/A 08/01/2016   Procedure: Pacemaker Implant;  Surgeon: Will Meredith [place], MD;  Location: [facility];  Service: Cardiovascular;  Laterality: N/A;  . HERNIA REPAIR    . PATCH ANGIOPLASTY Left 07/14/2013   Procedure: PATCH ANGIOPLASTY using Vascu-Guard Vascular Patch;  Surgeon: Serafina Mitchell, MD;  Location: [facility];  Service: Vascular;  Laterality: Left;  . SPINE SURGERY  1992   Dr. Amado Coe  .  VASECTOMY      Social History   Social History  . Marital status: Widowed    Spouse name: N/A  . Number of children: N/A  . Years of education: N/A   Occupational History  . Not on file.   Social History Main Topics  . Smoking status: Never Smoker  . Smokeless tobacco: Never Used  . Alcohol use No  . Drug use: No  . Sexual  activity: Not on file   Other Topics Concern  . Not on file   Social History Narrative  . No narrative on file    Allergies  Allergen Reactions  . Strawberry Extract Shortness Of Breath and Swelling  . Ace Inhibitors     POSSIBLE ANGIOEDEMA  . Cardura [Doxazosin Mesylate] Other (See Comments)    Erythema  . Penicillins Rash    RASH    Family History  Problem Relation Age of Onset  . Heart attack Father   . Cancer Daughter     Prior to Admission medications   Medication Sig Start Date End Date Taking? Authorizing Provider  amLODipine (NORVASC) 10 MG tablet Take 10 mg by mouth daily.   Yes Historical Provider, MD  aspirin 81 MG tablet Take 81 mg by mouth daily.   Yes Historical Provider, MD  atorvastatin (LIPITOR) 40 MG tablet Take 1 tablet (40 mg total) by mouth daily. 08/02/16  Yes Erma Heritage, PA  finasteride (PROSCAR) 5 MG tablet Take 5 mg by mouth daily.   Yes Historical Provider, MD  hydrochlorothiazide (HYDRODIURIL) 25 MG tablet Take 25 mg by mouth daily.   Yes Historical Provider, MD    Physical Exam: Vitals:   08/13/16 0500 08/13/16 0545 08/13/16 0630 08/13/16 0715  BP: 126/72 131/73 141/77 126/65  Pulse: 66 (!) 59 68 65  Resp: 19 14 12 17   Temp:      TempSrc:      SpO2: 96% 92%  98%     General:  Appears calm and comfortable No acute distress Eyes:  PERRL, EOMI, normal lids, iris ENT:  grossly normal hearing, lips & tongue, his membranes of his mouth are pink slightly dry Neck:  no LAD, masses or thyromegaly Cardiovascular:  RRR, no m/r/g. No LE edema. Pedal pulses present and palpable recent pacer site clean and dry Respiratory:  CTA bilaterally, no w/r/r. Normal respiratory effort. Abdomen:  soft, ntnd, positive bowel sounds throughout no guarding or rebounding Skin:  no rash or induration seen on limited exam Musculoskeletal:  grossly normal tone BUE/BLE, good ROM, no bony abnormality Psychiatric:  grossly normal mood and affect, speech fluent  and appropriate, AOx3 Neurologic:  CN 2-12 grossly intact, moves all extremities in coordinated fashion, sensation intact  Labs on Admission: I have personally reviewed following labs and imaging studies  CBC:  Recent Labs Lab 08/13/16 0005 08/13/16 0527  WBC 17.0* 15.5*  NEUTROABS 10.5*  --   HGB 14.7 14.1  HCT 42.0 41.1  MCV 89.4 89.2  PLT 296 [id number]   Basic Metabolic Panel:  Recent Labs Lab 08/13/16 0005  NA 136  K 3.6  CL 98*  CO2 27  GLUCOSE 133*  BUN 30*  CREATININE 1.36*  CALCIUM 9.6   GFR: Estimated Creatinine Clearance: 51.8 mL/min (by C-G formula based on SCr of 1.36 mg/dL (H)). Liver Function Tests:  Recent Labs Lab 08/13/16 0005  AST 31  ALT 23  ALKPHOS 75  BILITOT 0.7  PROT 7.1  ALBUMIN 4.0   No results for input(s):  LIPASE, AMYLASE in the last 168 hours. No results for input(s): AMMONIA in the last 168 hours. Coagulation Profile: No results for input(s): INR, PROTIME in the last 168 hours. Cardiac Enzymes: No results for input(s): CKTOTAL, CKMB, CKMBINDEX, TROPONINI in the last 168 hours. BNP (last 3 results) No results for input(s): PROBNP in the last 8760 hours. HbA1C: No results for input(s): HGBA1C in the last 72 hours. CBG: No results for input(s): GLUCAP in the last 168 hours. Lipid Profile: No results for input(s): CHOL, HDL, LDLCALC, TRIG, CHOLHDL, LDLDIRECT in the last 72 hours. Thyroid Function Tests: No results for input(s): TSH, T4TOTAL, FREET4, T3FREE, THYROIDAB in the last 72 hours. Anemia Panel: No results for input(s): VITAMINB12, FOLATE, FERRITIN, TIBC, IRON, RETICCTPCT in the last 72 hours. Urine analysis:    Component Value Date/Time   COLORURINE YELLOW 08/13/2016 0729   APPEARANCEUR CLEAR 08/13/2016 0729   LABSPEC 1.018 08/13/2016 0729   PHURINE 6.0 08/13/2016 0729   GLUCOSEU NEGATIVE 08/13/2016 0729   HGBUR NEGATIVE 08/13/2016 0729   BILIRUBINUR NEGATIVE 08/13/2016 0729   KETONESUR NEGATIVE 08/13/2016 0729    PROTEINUR NEGATIVE 08/13/2016 0729   UROBILINOGEN 0.2 06/03/2015 1303   NITRITE NEGATIVE 08/13/2016 0729   LEUKOCYTESUR NEGATIVE 08/13/2016 0729    Creatinine Clearance: Estimated Creatinine Clearance: 51.8 mL/min (by C-G formula based on SCr of 1.36 mg/dL (H)).  Sepsis Labs: @LABRCNTIP (procalcitonin:4,lacticidven:4) )No results found for this or any previous visit (from the past 240 hour(s)).   Radiological Exams on Admission: No results found.  EKG: None  Assessment/Plan Principal Problem:   BRBPR (bright red blood per rectum) Active Problems:   Dizziness   Complete heart block ([facility])   GI bleed   Acute kidney injury ([place])   Hypertension   Cerebrovascular disease   Anxiety   Leukocytosis   CAD (coronary artery disease)   #1. Bright red blood per rectum 2 episodes at home. Some concern for GI bleed but patient does have a history of hemorrhoids. He takes 81 mg of aspirin only. Denies NSAID use. FOBT positive. Hemoglobin stable at 14. Hemodynamically stable. Unsure of last colonoscopy but greater than 5 years ago in [place] for observation -Serial CBCs -We'll discontinue Protonix at was initiated in the emergency department -hold aspirin for now -Monitor any bleeding -GI consult for completeness  #2. Acute kidney injury. Chart review indicates likely a chronic component involved. Creatinine 1.36 on admission. This appears to be close to a baseline. -Gentle IV fluids -Hold nephrotoxins -Monitor urine output -Recheck in the morning  #3. Dizziness. Patient reports to the emergency room provider some dizziness with position change. He denied any dizziness lightheadedness to this provider. Hemoglobin stable at 14 -Obtain orthostatic vital signs -Continue gentle IV fluids -Ambulated in hall -Monitor  #5. Leukocytosis. WBC 17 on presentation. Trending down at point of admission. He is afebrile hemodynamically stable nontoxic appearing. May be reactive. maker inserted  last week -Obtain urinalysis -Chest x-ray for completeness -Recheck in the morning  #6. CAD/complete heart block. Status post pacemaker implant 12 days ago. Chart review indicates he received a [employer] Advisa DR MRI surescan model. Cardiac cath 2016 No chest pain. CAD medically managed -Continue statin -Hold aspirin for now -Monitor on telemetry  #7. Cerebrovascular disease. Status post bilateral carotid endarterectomy 2014. Stable.   #8. Hypertension. Fair control in the emergency department. Home medications include amlodipine and hydrochlorothiazide. -Continue amlodipine -Hold hydrochlorothiazide for now -Monitor    DVT prophylaxis: scd Code Status: full  Family Communication: none present  Disposition Plan: home hopefully tomorrow  Consults called:  [place] Admission status: obs   Dyanne Carrel M MD [facility]  If 7PM-7AM, please contact night-coverage [email or web address] Password [email or web address]  08/13/2016, 8:02 AM

## 2016-08-13 NOTE — Consult Note (Signed)
Referring Provider: [facility] Primary Care Physician:  Monico Blitz, MD Primary Gastroenterologist:  Dorma Russell  Reason for Consultation:  Rectal bleeding  HPI: [name] is a 75 y.o. male with history of PVD, severe CAD, and hx of CVA. He is s/p PPM 08/05/16 after presenting to ED with complete heart block.   Patient presented to ED last pm for painless rectal bleeding. Patient was lying in bed around 7pm and felt something wet. Bright red blood had come of of his rectum. He had a second episode around 10pm. No associated abdominal or rectal pain. No associated constipation. He did not become lightheaded / SOB. Patient describes significant lower GI bleed approx 6 months ago at [facility]. Apparently he was on drug similar to plavix at the time. Patient doesn't think he had a colonoscopy done but prescription antiplatelet med stopped and he was placed on ASA. Patient doesn't know when his last colonoscopy was, maybe greater than 10 year ago.   Was on antiplatet therapy and had a GI bleed in [place]. Antiplatelet stopped except, now on just daily baby ASA.  Past Medical History:  Diagnosis Date  . Anxiety   . Arthritis   . Backache, unspecified   . BPH (benign prostatic hypertrophy)   . CAD (coronary artery disease) 08/13/2016  . Carotid artery occlusion   . Cerebrovascular disease, unspecified   . Cervicalgia   . Depressive disorder, not elsewhere classified   . Enlarged prostate   . Hypertension   . Inguinal hernia without mention of obstruction or gangrene, unilateral or unspecified, (not specified as recurrent)   . Other psoriasis   . Pain in limb   . Stroke ([place])   . Unspecified hemorrhoids without mention of complication   . Unspecified transient cerebral ischemia   . Varicose veins     Past Surgical History:  Procedure Laterality Date  . BACK SURGERY    . CARDIAC CATHETERIZATION N/A 06/06/2015   Procedure: Left Heart Cath and Coronary Angiography;  Surgeon:  Wellington Hampshire, MD;  Location: [facility];  Service: Cardiovascular;  Laterality: N/A;  . CAROTID ENDARTERECTOMY Right 10-13-13   cea  . CAROTID ENDARTERECTOMY Left 07-14-13   cea  . COLONOSCOPY    . ENDARTERECTOMY Left 07/14/2013   Procedure: ENDARTERECTOMY CAROTID-LEFT;  Surgeon: Serafina Mitchell, MD;  Location: [facility];  Service: Vascular;  Laterality: Left;  . ENDARTERECTOMY Right 10/13/2013   Procedure: RIGHT CAROTID ARTERY ENDARTERECTOMY WITH VASCU GUARD PATCH ANGIOPLASTY;  Surgeon: Serafina Mitchell, MD;  Location: [facility];  Service: Vascular;  Laterality: Right;  . EP IMPLANTABLE DEVICE N/A 08/01/2016   Procedure: Pacemaker Implant;  Surgeon: Will Meredith [place], MD;  Location: [facility];  Service: Cardiovascular;  Laterality: N/A;  . HERNIA REPAIR    . PATCH ANGIOPLASTY Left 07/14/2013   Procedure: PATCH ANGIOPLASTY using Vascu-Guard Vascular Patch;  Surgeon: Serafina Mitchell, MD;  Location: [facility];  Service: Vascular;  Laterality: Left;  . SPINE SURGERY  1992   Dr. Amado Coe  . VASECTOMY      Prior to Admission medications   Medication Sig Start Date End Date Taking? Authorizing Provider  amLODipine (NORVASC) 10 MG tablet Take 10 mg by mouth daily.   Yes Historical Provider, MD  aspirin 81 MG tablet Take 81 mg by mouth daily.   Yes Historical Provider, MD  atorvastatin (LIPITOR) 40 MG tablet Take 1 tablet (40 mg total) by mouth daily. 08/02/16  Yes Erma Heritage, [place]  finasteride (PROSCAR) 5 MG tablet Take 5 mg by mouth daily.   Yes Historical Provider, MD  hydrochlorothiazide (HYDRODIURIL) 25 MG tablet Take 25 mg by mouth daily.   Yes Historical Provider, MD    Current Facility-Administered Medications  Medication Dose Route Frequency Provider Last Rate Last Dose  . 0.9 %  sodium chloride infusion   Intravenous Continuous Radene Gunning, NP 50 mL/hr at 08/13/16 0746    . acetaminophen (TYLENOL) tablet 650 mg  650 mg Oral Q6H PRN Radene Gunning, NP       Or  .  acetaminophen (TYLENOL) suppository 650 mg  650 mg Rectal Q6H PRN Radene Gunning, NP      . amLODipine (NORVASC) tablet 10 mg  10 mg Oral Daily Lezlie Octave Black, NP      . atorvastatin (LIPITOR) tablet 40 mg  40 mg Oral Daily Radene Gunning, NP      . finasteride (PROSCAR) tablet 5 mg  5 mg Oral Daily Lezlie Octave Black, NP      . ondansetron [facility]) tablet 4 mg  4 mg Oral Q6H PRN Radene Gunning, NP       Or  . ondansetron [facility]) injection 4 mg  4 mg Intravenous Q6H PRN Lezlie Octave Black, NP      . sodium chloride flush (NS) 0.9 % injection 3 mL  3 mL Intravenous Q12H Radene Gunning, NP       Current Outpatient Prescriptions  Medication Sig Dispense Refill  . amLODipine (NORVASC) 10 MG tablet Take 10 mg by mouth daily.    Marland Kitchen aspirin 81 MG tablet Take 81 mg by mouth daily.    Marland Kitchen atorvastatin (LIPITOR) 40 MG tablet Take 1 tablet (40 mg total) by mouth daily. 30 tablet 6  . finasteride (PROSCAR) 5 MG tablet Take 5 mg by mouth daily.    . hydrochlorothiazide (HYDRODIURIL) 25 MG tablet Take 25 mg by mouth daily.      Allergies as of 08/12/2016 - Review Complete 08/12/2016  Allergen Reaction Noted  . Strawberry extract Shortness Of Breath and Swelling 06/02/2015  . Ace inhibitors  06/28/2013  . Cardura [doxazosin mesylate] Other (See Comments) 06/28/2013  . Penicillins Rash 06/28/2013    Family History  Problem Relation Age of Onset  . Heart attack Father   . Cancer Daughter     Social History   Social History  . Marital status: Widowed    Spouse name: N/A  . Number of children: N/A  . Years of education: N/A   Occupational History  . Not on file.   Social History Main Topics  . Smoking status: Never Smoker  . Smokeless tobacco: Never Used  . Alcohol use No  . Drug use: No  . Sexual activity: Not on file   Other Topics Concern  . Not on file   Social History Narrative  . No narrative on file    Review of Systems: Itching / rash on back, upper and lower extremities. All other  systems reviewed and negative except where noted in HPI.   Physical Exam: Vital signs in last 24 hours: Temp:  [97.8 F (36.6 C)] 97.8 F (36.6 C) (10/17 2352) Pulse Rate:  [59-72] 72 (10/18 0800) Resp:  [12-23] 18 (10/18 0800) BP: (115-142)/(65-99) 115/77 (10/18 0800) SpO2:  [92 %-99 %] 98 % (10/18 0800)   General:   Alert, well-developed white male in NAD Head:  Normocephalic and atraumatic. Eyes:  Sclera clear, no icterus.  Conjunctiva pink. Ears:  Normal auditory acuity. Nose:  No deformity, discharge,  or lesions. Mouth:  No deformity or lesions.   Neck:  Supple; no masses Lungs:  Clear throughout to auscultation.   No wheezes, crackles, or rhonchi.  Heart:  Regular rate and rhythm; soft murmur heard, no peripheral edema. Abdomen:  Soft,nontender, BS active,nonpalp mass or hsm.   Rectal:  + skin tag. Anal sphincter a little patulous. No stool or blood in vault. Thickening posterior midline canal.  Msk:  Symmetrical without gross deformities. . Neurologic:  Alert and  oriented x4;  grossly normal neurologically. Skin:  Generalized rash on back and to lesser degree on both arms. Marland Kitchen Psych:  Alert and cooperative. Normal mood and affect.   Lab Results:  Recent Labs  08/13/16 0005 08/13/16 0527  WBC 17.0* 15.5*  HGB 14.7 14.1  HCT 42.0 41.1  PLT 296 247   BMET  Recent Labs  08/13/16 0005  NA 136  K 3.6  CL 98*  CO2 27  GLUCOSE 133*  BUN 30*  CREATININE 1.36*  CALCIUM 9.6   LFT  Recent Labs  08/13/16 0005  PROT 7.1  ALBUMIN 4.0  AST 31  ALT 23  ALKPHOS 75  BILITOT 0.7    Studies/Results: Dg Chest Port 1 View  Result Date: 08/13/2016 CLINICAL DATA:  Leukocytosis.  Recent pacemaker EXAM: PORTABLE CHEST 1 VIEW COMPARISON:  08/02/2016 FINDINGS: Lungs remain clear. No infiltrate or effusion. Negative for heart failure. Dual lead pacemaker unchanged in position. IMPRESSION: No active disease. Electronically Signed   By: Franchot Gallo [name]   On:  08/13/2016 08:15    IMPRESSION / PLAN:   40. 75 year old male with painless rectal bleeding, two episodes last night. Hemodynamically stable. Hgb stable overnight 14.7 ----> 14.1.  Suspect bleeding perianal in nature vrs diverticular. Per patient he had a significant lower GI bleed approx 6 months ago in [place] on antiplatelet ( no records available).  -Given this is second bleed in few months and patient hasn't had a colonoscopy in possibly 10 years it is reasonable to proceed with one. Patient will be scheduled for a colonoscopy with possible polypectomy tomorrow to be done by Dr. Havery Moros.   -clears today, will prep this evening -CBC Q 6 x4 -If rebleeds today, consider RBC scan to help localize bleeding  Leukocytosis, chronic. Etiology? WBC 15. He is afebrile.   Severe CAD. He had a LHC mid August with findings of significant 2 vessel CAD involving RCA and LAD. He was asymptomatic, risk of PCI felt to outweigh benefits and decision was to manage medically for now. 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  Hx of cva 2010, 2014  Tye Savoy  08/13/2016, 9:20 AM  Pager number [phone]

## 2016-08-13 NOTE — ED Notes (Signed)
Attempted to give report. Will call back.

## 2016-08-14 ENCOUNTER — Encounter (HOSPITAL_COMMUNITY): Payer: Self-pay

## 2016-08-14 ENCOUNTER — Encounter (HOSPITAL_COMMUNITY)
Admission: EM | Disposition: A | Discharge status: Home / Self Care | Payer: Self-pay | Source: Home / Self Care | Attending: Family Medicine

## 2016-08-14 DIAGNOSIS — D124 Benign neoplasm of descending colon: Secondary | ICD-10-CM

## 2016-08-14 DIAGNOSIS — K648 Other hemorrhoids: Secondary | ICD-10-CM

## 2016-08-14 DIAGNOSIS — K922 Gastrointestinal hemorrhage, unspecified: Secondary | ICD-10-CM | POA: Diagnosis not present

## 2016-08-14 DIAGNOSIS — I10 Essential (primary) hypertension: Secondary | ICD-10-CM | POA: Diagnosis not present

## 2016-08-14 DIAGNOSIS — K625 Hemorrhage of anus and rectum: Secondary | ICD-10-CM | POA: Diagnosis not present

## 2016-08-14 HISTORY — PX: COLONOSCOPY: SHX5424

## 2016-08-14 LAB — BASIC METABOLIC PANEL
ANION GAP: 8 (ref 5–15)
BUN: 16 mg/dL (ref 6–20)
CHLORIDE: 105 mmol/L (ref 101–111)
CO2: 28 mmol/L (ref 22–32)
Calcium: 8.9 mg/dL (ref 8.9–10.3)
Creatinine, Ser: 1.22 mg/dL (ref 0.61–1.24)
GFR calc non Af Amer: 55 mL/min — ABNORMAL LOW (ref >60)
Glucose, Bld: 137 mg/dL — ABNORMAL HIGH (ref 65–99)
POTASSIUM: 3.6 mmol/L (ref 3.5–5.1)
SODIUM: 141 mmol/L (ref 135–145)

## 2016-08-14 LAB — CBC
HEMATOCRIT: 42.8 % (ref 39.0–52.0)
HEMOGLOBIN: 14.6 g/dL (ref 13.0–17.0)
MCH: 30.9 pg (ref 26.0–34.0)
MCHC: 34.1 g/dL (ref 30.0–36.0)
MCV: 90.5 fL (ref 78.0–100.0)
PLATELETS: 293 10*3/uL (ref 150–400)
RBC: 4.73 MIL/uL (ref 4.22–5.81)
RDW: 12.5 % (ref 11.5–15.5)
WBC: 15.1 10*3/uL — AB (ref 4.0–10.5)

## 2016-08-14 SURGERY — COLONOSCOPY
Anesthesia: Moderate Sedation

## 2016-08-14 MED ORDER — DIPHENHYDRAMINE HCL 50 MG/ML IJ SOLN
INTRAMUSCULAR | Status: AC
  Filled 2016-08-14: qty 1

## 2016-08-14 MED ORDER — DIPHENHYDRAMINE HCL 50 MG/ML IJ SOLN
INTRAMUSCULAR | Status: DC | PRN
  Administered 2016-08-14: 25 mg via INTRAVENOUS

## 2016-08-14 MED ORDER — MIDAZOLAM HCL 5 MG/ML IJ SOLN
INTRAMUSCULAR | Status: AC
  Filled 2016-08-14: qty 2

## 2016-08-14 MED ORDER — FENTANYL CITRATE (PF) 100 MCG/2ML IJ SOLN
INTRAMUSCULAR | Status: DC | PRN
  Administered 2016-08-14 (×2): 25 ug via INTRAVENOUS

## 2016-08-14 MED ORDER — FENTANYL CITRATE (PF) 100 MCG/2ML IJ SOLN
INTRAMUSCULAR | Status: AC
Start: 2016-08-14 — End: 2016-08-14
  Filled 2016-08-14: qty 2

## 2016-08-14 MED ORDER — MIDAZOLAM HCL 5 MG/5ML IJ SOLN
INTRAMUSCULAR | Status: DC | PRN
  Administered 2016-08-14: 2 mg via INTRAVENOUS
  Administered 2016-08-14: 1 mg via INTRAVENOUS

## 2016-08-14 NOTE — Op Note (Signed)
[facility] Patient Name: [name] Procedure Date : 08/14/2016 MRN: [id number] Attending MD: Carlota Raspberry. Lorayne Getchell MD, MD Date of Birth: [birth year] CSN: [id number] Age: 75 Admit Type: Inpatient Procedure:                Colonoscopy Indications:              Evaluation of unexplained GI bleeding, recurrent                            episodes of hematochezia Providers:                Carlota Raspberry. Jashaun Penrose MD, MD, Cleda Daub, RN,                            [facility] Tech, Technician Referring MD:              Medicines:                Fentanyl 50 micrograms IV, Midazolam 3 mg IV,                            Diphenhydramine 25 mg IV Complications:            No immediate complications. Estimated blood loss:                            Minimal. Estimated Blood Loss:     Estimated blood loss was minimal. Procedure:                Pre-Anesthesia Assessment:                           - Prior to the procedure, a History and Physical                            was performed, and patient medications and                            allergies were reviewed. The patient's tolerance of                            previous anesthesia was also reviewed. The risks                            and benefits of the procedure and the sedation                            options and risks were discussed with the patient.                            All questions were answered, and informed consent                            was obtained. Prior Anticoagulants: The patient has  taken aspirin, last dose was 1 day prior to                            procedure. ASA Grade Assessment: III - A patient                            with severe systemic disease. After reviewing the                            risks and benefits, the patient was deemed in                            satisfactory condition to undergo the procedure.                           After obtaining informed  consent, the colonoscope                            was passed under direct vision. Throughout the                            procedure, the patient's blood pressure, pulse, and                            oxygen saturations were monitored continuously. The                            EC-[id number] scope was introduced through                            the anus and advanced to the the terminal ileum,                            with identification of the appendiceal orifice and                            IC valve. The colonoscopy was performed without                            difficulty. The patient tolerated the procedure                            well. The quality of the bowel preparation was                            adequate. The terminal ileum, ileocecal valve,                            appendiceal orifice, and rectum were photographed. Scope In: 11:12:18 AM Scope Out: 11:30:34 AM Scope Withdrawal Time: 0 hours 14 minutes 59 seconds  Total Procedure Duration: 0 hours 18 minutes 16 seconds  Findings:      The perianal exam findings include non-thrombosed external hemorrhoids       and  skin tags.      A 5 mm polyp was found in the descending colon. The polyp was sessile.       The polyp was removed with a cold snare. Resection and retrieval were       complete.      The terminal ileum appeared normal.      Non-bleeding internal hemorrhoids were found during retroflexion. The       hemorrhoids were large and inflamed, and the likely cause of the       patient's symptoms.      The exam was otherwise without abnormality. Impression:               - Non-thrombosed external hemorrhoids and perianal                            skin tags found on perianal exam.                           - One 5 mm polyp in the descending colon, removed                            with a cold snare. Resected and retrieved.                           - The examined portion of the ileum was normal.                            - Non-bleeding large internal hemorrhoids.                           - The examination was otherwise normal.                           Overall, suspect patient is having hemorrhoidal                            bleeding causing his symptoms. Moderate Sedation:      Moderate (conscious) sedation was administered by the endoscopy nurse       and supervised by the endoscopist. The following parameters were       monitored: oxygen saturation, heart rate, blood pressure, and response       to care. Total physician intraservice time was 30 minutes. Recommendation:           - Return patient to hospital ward for ongoing care.                           - Patient cleared for discharge home today                           - Resume regular diet.                           - Continue present medications including aspirin                           - No other NSAIDs                           -  Await pathology results.                           - Repeat colonoscopy for surveillance based on                            pathology results                           - Daily fiber supplement for hemorrhoids                           - I think he is a good candidate for hemorrhoid                            banding, he will be contacted by our office to                            schedule a banding a outpatient in the next 1-2                            weeks Procedure Code(s):        --- Professional ---                           [phone], Colonoscopy, flexible; with removal of                            tumor(s), polyp(s), or other lesion(s) by snare                            technique                           99152, Moderate sedation services provided by the                            same physician or other qualified health care                            professional performing the diagnostic or                            therapeutic service that the sedation supports,                             requiring the presence of an independent trained                            observer to assist in the monitoring of the                            patient's level of consciousness and physiological  status; initial 15 minutes of intraservice time,                            patient age 86 years or older                           [phone], Moderate sedation services; each additional                            15 minutes intraservice time Diagnosis Code(s):        --- Professional ---                           D12.4, Benign neoplasm of descending colon                           K64.4, Residual hemorrhoidal skin tags                           K64.8, Other hemorrhoids                           K92.2, Gastrointestinal hemorrhage, unspecified CPT copyright 2016 American Medical Association. All rights reserved. The codes documented in this report are preliminary and upon coder review may  be revised to meet current compliance requirements. Remo Lipps P. Tesla Keeler MD, MD 08/14/2016 11:37:28 AM This report has been signed electronically. Number of Addenda: 0

## 2016-08-14 NOTE — Interval H&P Note (Signed)
History and Physical Interval Note:  08/14/2016 10:12 AM  [name]  has presented today for surgery, with the diagnosis of hematochezia  The various methods of treatment have been discussed with the patient and family. After consideration of risks, benefits and other options for treatment, the patient has consented to  Procedure(s): COLONOSCOPY (N/A) as a surgical intervention .  The patient's history has been reviewed, patient examined, no change in status, stable for surgery.  I have reviewed the patient's chart and labs.  Questions were answered to the patient's satisfaction.     Renelda Loma Franchot Pollitt

## 2016-08-14 NOTE — H&P (View-Only) (Signed)
Referring Provider: [facility] Primary Care Physician:  Monico Blitz, MD Primary Gastroenterologist:  Dorma Russell  Reason for Consultation:  Rectal bleeding  HPI: [name] is a 75 y.o. male with history of PVD, severe CAD, and hx of CVA. He is s/p PPM 08/05/16 after presenting to ED with complete heart block.   Patient presented to ED last pm for painless rectal bleeding. Patient was lying in bed around 7pm and felt something wet. Bright red blood had come of of his rectum. He had a second episode around 10pm. No associated abdominal or rectal pain. No associated constipation. He did not become lightheaded / SOB. Patient describes significant lower GI bleed approx 6 months ago at [facility]. Apparently he was on drug similar to plavix at the time. Patient doesn't think he had a colonoscopy done but prescription antiplatelet med stopped and he was placed on ASA. Patient doesn't know when his last colonoscopy was, maybe greater than 10 year ago.   Was on antiplatet therapy and had a GI bleed in [place]. Antiplatelet stopped except, now on just daily baby ASA.  Past Medical History:  Diagnosis Date  . Anxiety   . Arthritis   . Backache, unspecified   . BPH (benign prostatic hypertrophy)   . CAD (coronary artery disease) 08/13/2016  . Carotid artery occlusion   . Cerebrovascular disease, unspecified   . Cervicalgia   . Depressive disorder, not elsewhere classified   . Enlarged prostate   . Hypertension   . Inguinal hernia without mention of obstruction or gangrene, unilateral or unspecified, (not specified as recurrent)   . Other psoriasis   . Pain in limb   . Stroke ([place])   . Unspecified hemorrhoids without mention of complication   . Unspecified transient cerebral ischemia   . Varicose veins     Past Surgical History:  Procedure Laterality Date  . BACK SURGERY    . CARDIAC CATHETERIZATION N/A 06/06/2015   Procedure: Left Heart Cath and Coronary Angiography;  Surgeon:  Wellington Hampshire, MD;  Location: [place] [facility];  Service: Cardiovascular;  Laterality: N/A;  . CAROTID ENDARTERECTOMY Right 10-13-13   cea  . CAROTID ENDARTERECTOMY Left 07-14-13   cea  . COLONOSCOPY    . ENDARTERECTOMY Left 07/14/2013   Procedure: ENDARTERECTOMY CAROTID-LEFT;  Surgeon: Serafina Mitchell, MD;  Location: [place];  Service: Vascular;  Laterality: Left;  . ENDARTERECTOMY Right 10/13/2013   Procedure: RIGHT CAROTID ARTERY ENDARTERECTOMY WITH VASCU GUARD PATCH ANGIOPLASTY;  Surgeon: Serafina Mitchell, MD;  Location: [place];  Service: Vascular;  Laterality: Right;  . EP IMPLANTABLE DEVICE N/A 08/01/2016   Procedure: Pacemaker Implant;  Surgeon: Will Meredith Leeds, MD;  Location: [place] [facility];  Service: Cardiovascular;  Laterality: N/A;  . HERNIA REPAIR    . PATCH ANGIOPLASTY Left 07/14/2013   Procedure: PATCH ANGIOPLASTY using Vascu-Guard Vascular Patch;  Surgeon: Serafina Mitchell, MD;  Location: [place];  Service: Vascular;  Laterality: Left;  . SPINE SURGERY  1992   Dr. Amado Coe  . VASECTOMY      Prior to Admission medications   Medication Sig Start Date End Date Taking? Authorizing Provider  amLODipine (NORVASC) 10 MG tablet Take 10 mg by mouth daily.   Yes Historical Provider, MD  aspirin 81 MG tablet Take 81 mg by mouth daily.   Yes Historical Provider, MD  atorvastatin (LIPITOR) 40 MG tablet Take 1 tablet (40 mg total) by mouth daily. 08/02/16  Yes Erma Heritage, [place]  finasteride (PROSCAR) 5 MG tablet Take 5 mg by mouth daily.   Yes Historical Provider, MD  hydrochlorothiazide (HYDRODIURIL) 25 MG tablet Take 25 mg by mouth daily.   Yes Historical Provider, MD    Current Facility-Administered Medications  Medication Dose Route Frequency Provider Last Rate Last Dose  . 0.9 %  sodium chloride infusion   Intravenous Continuous Radene Gunning, NP 50 mL/hr at 08/13/16 0746    . acetaminophen (TYLENOL) tablet 650 mg  650 mg Oral Q6H PRN Radene Gunning, NP       Or  .  acetaminophen (TYLENOL) suppository 650 mg  650 mg Rectal Q6H PRN Radene Gunning, NP      . amLODipine (NORVASC) tablet 10 mg  10 mg Oral Daily Lezlie Octave Black, NP      . atorvastatin (LIPITOR) tablet 40 mg  40 mg Oral Daily Radene Gunning, NP      . finasteride (PROSCAR) tablet 5 mg  5 mg Oral Daily Lezlie Octave Black, NP      . ondansetron [facility]) tablet 4 mg  4 mg Oral Q6H PRN Radene Gunning, NP       Or  . ondansetron [facility]) injection 4 mg  4 mg Intravenous Q6H PRN Lezlie Octave Black, NP      . sodium chloride flush (NS) 0.9 % injection 3 mL  3 mL Intravenous Q12H Radene Gunning, NP       Current Outpatient Prescriptions  Medication Sig Dispense Refill  . amLODipine (NORVASC) 10 MG tablet Take 10 mg by mouth daily.    Marland Kitchen aspirin 81 MG tablet Take 81 mg by mouth daily.    Marland Kitchen atorvastatin (LIPITOR) 40 MG tablet Take 1 tablet (40 mg total) by mouth daily. 30 tablet 6  . finasteride (PROSCAR) 5 MG tablet Take 5 mg by mouth daily.    . hydrochlorothiazide (HYDRODIURIL) 25 MG tablet Take 25 mg by mouth daily.      Allergies as of 08/12/2016 - Review Complete 08/12/2016  Allergen Reaction Noted  . Strawberry extract Shortness Of Breath and Swelling 06/02/2015  . Ace inhibitors  06/28/2013  . Cardura [doxazosin mesylate] Other (See Comments) 06/28/2013  . Penicillins Rash 06/28/2013    Family History  Problem Relation Age of Onset  . Heart attack Father   . Cancer Daughter     Social History   Social History  . Marital status: Widowed    Spouse name: N/A  . Number of children: N/A  . Years of education: N/A   Occupational History  . Not on file.   Social History Main Topics  . Smoking status: Never Smoker  . Smokeless tobacco: Never Used  . Alcohol use No  . Drug use: No  . Sexual activity: Not on file   Other Topics Concern  . Not on file   Social History Narrative  . No narrative on file    Review of Systems: Itching / rash on back, upper and lower extremities. All other  systems reviewed and negative except where noted in HPI.   Physical Exam: Vital signs in last 24 hours: Temp:  [97.8 F (36.6 C)] 97.8 F (36.6 C) (10/17 2352) Pulse Rate:  [59-72] 72 (10/18 0800) Resp:  [12-23] 18 (10/18 0800) BP: (115-142)/(65-99) 115/77 (10/18 0800) SpO2:  [92 %-99 %] 98 % (10/18 0800)   General:   Alert, well-developed white male in NAD Head:  Normocephalic and atraumatic. Eyes:  Sclera clear, no icterus.  Conjunctiva pink. Ears:  Normal auditory acuity. Nose:  No deformity, discharge,  or lesions. Mouth:  No deformity or lesions.   Neck:  Supple; no masses Lungs:  Clear throughout to auscultation.   No wheezes, crackles, or rhonchi.  Heart:  Regular rate and rhythm; soft murmur heard, no peripheral edema. Abdomen:  Soft,nontender, BS active,nonpalp mass or hsm.   Rectal:  + skin tag. Anal sphincter a little patulous. No stool or blood in vault. Thickening posterior midline canal.  Msk:  Symmetrical without gross deformities. . Neurologic:  Alert and  oriented x4;  grossly normal neurologically. Skin:  Generalized rash on back and to lesser degree on both arms. Marland Kitchen Psych:  Alert and cooperative. Normal mood and affect.   Lab Results:  Recent Labs  08/13/16 0005 08/13/16 0527  WBC 17.0* 15.5*  HGB 14.7 14.1  HCT 42.0 41.1  PLT 296 247   BMET  Recent Labs  08/13/16 0005  NA 136  K 3.6  CL 98*  CO2 27  GLUCOSE 133*  BUN 30*  CREATININE 1.36*  CALCIUM 9.6   LFT  Recent Labs  08/13/16 0005  PROT 7.1  ALBUMIN 4.0  AST 31  ALT 23  ALKPHOS 75  BILITOT 0.7    Studies/Results: Dg Chest Port 1 View  Result Date: 08/13/2016 CLINICAL DATA:  Leukocytosis.  Recent pacemaker EXAM: PORTABLE CHEST 1 VIEW COMPARISON:  08/02/2016 FINDINGS: Lungs remain clear. No infiltrate or effusion. Negative for heart failure. Dual lead pacemaker unchanged in position. IMPRESSION: No active disease. Electronically Signed   By: Franchot Gallo [name]   On:  08/13/2016 08:15    IMPRESSION / PLAN:   51. 75 year old male with painless rectal bleeding, two episodes last night. Hemodynamically stable. Hgb stable overnight 14.7 ----> 14.1.  Suspect bleeding perianal in nature vrs diverticular. Per patient he had a significant lower GI bleed approx 6 months ago in [place]  on antiplatelet ( no records available).  -Given this is second bleed in few months and patient hasn't had a colonoscopy in possibly 10 years it is reasonable to proceed with one. Patient will be scheduled for a colonoscopy with possible polypectomy tomorrow to be done by Dr. Havery Moros.   -clears today, will prep this evening -CBC Q 6 x4 -If rebleeds today, consider RBC scan to help localize bleeding  Leukocytosis, chronic. Etiology? WBC 15. He is afebrile.   Severe CAD. He had a LHC mid August with findings of significant 2 vessel CAD involving RCA and LAD. He was asymptomatic, risk of PCI felt to outweigh benefits and decision was to manage medically for now. 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  Hx of cva 2010, 2014  Tye Savoy  08/13/2016, 9:20 AM  Pager number [phone]

## 2016-08-14 NOTE — Progress Notes (Signed)
PROGRESS NOTE    [name]  [id number] DOB: May 15, 1941 DOA: 08/13/2016  PCP: Monico Blitz, MD   Brief Narrative:  75 y.o. male with medical history significant for CAD, pacemaker (inserted last week due to complete heart block), PVD, hypertension, dyslipidemia who presented to [facility] ED with 2 episodes of rectal bleed the day prior to the admission.  Hgb was stable and WNL on admission. GI has seen the pt in consultation and plan is for colonoscopy today.   Assessment & Plan:   Principal Problem:   BRBPR (bright red blood per rectum) / Acute lower GI bleed  - Plan for colonoscopy this am - Hgb stable, WNL - GI following, appreciate their recommendations - No further GI bleed this am - Repeat hgb in am  Active Problems:   Complete heart block (H[facility]) - Recent pacemaker placement     Essential hypertension - Continue norvasc    Dyslipidemia - Continue atorvastatin    CKD (chronic kidney disease) stage 2, GFR 60-89 ml/min - Cr 1.36 on admission and subsequent lab showed norma Cr     Leukocytosis - Unclear etiology - No evidence of acute infectious process - CXR showed no active disease  - UA negative     DVT prophylaxis: SCD's bilaterally  Code Status: full code  Family Communication: no family at the bedside this am Disposition Plan: home in am if hgb stable and if colonoscopy okay    Consultants:   GI, Dr. Stacey Drain   Procedures:   Plan for colonoscopy today  Antimicrobials:   None    Subjective: No further rectal bleed.  Objective: Vitals:   08/14/16 1143 08/14/16 1150 08/14/16 1246 08/14/16 1353  BP: (!) 148/90 (!) 162/66 109/72 133/70  Pulse: (!) 59 60 72 60  Resp: (!) 9 16  16   Temp: 98 F (36.7 C)  97.6 F (36.4 C) 97.5 F (36.4 C)  TempSrc: Oral  Oral Oral  SpO2: 98% 98% 98% 98%    Intake/Output Summary (Last 24 hours) at 08/14/16 1835 Last data filed at 08/14/16 0739  Gross per 24 hour  Intake          2148.33 ml  Output               900 ml  Net          1248.33 ml   There were no vitals filed for this visit.  Examination:  General exam: Appears calm and comfortable  Respiratory system: Clear to auscultation. Respiratory effort normal. Cardiovascular system: S1 & S2 heard, Rate controlled  Gastrointestinal system: Abdomen is nondistended, soft and nontender. No organomegaly or masses felt. Normal bowel sounds heard. Central nervous system: Alert and oriented. No focal neurological deficits. Extremities: Symmetric 5 x 5 power. Skin: No rashes, lesions or ulcers Psychiatry: Judgement and insight appear normal. Mood & affect appropriate.   Data Reviewed: I have personally reviewed following labs and imaging studies  CBC:  Recent Labs Lab 08/13/16 0005 08/13/16 0527 08/13/16 1602 08/14/16 0307  WBC 17.0* 15.5* 14.9* 15.1*  NEUTROABS 10.5*  --   --   --   HGB 14.7 14.1 14.5 14.6  HCT 42.0 41.1 41.2 42.8  MCV 89.4 89.2 89.4 90.5  PLT 296 247 262 0000000   Basic Metabolic Panel:  Recent Labs Lab 08/13/16 0005 08/14/16 0307  NA 136 141  K 3.6 3.6  CL 98* 105  CO2 27 28  GLUCOSE 133* 137*  BUN 30* 16  CREATININE 1.36* 1.22  CALCIUM 9.6 8.9   GFR: Estimated Creatinine Clearance: 57.7 mL/min (by C-G formula based on SCr of 1.22 mg/dL). Liver Function Tests:  Recent Labs Lab 08/13/16 0005  AST 31  ALT 23  ALKPHOS 75  BILITOT 0.7  PROT 7.1  ALBUMIN 4.0   No results for input(s): LIPASE, AMYLASE in the last 168 hours. No results for input(s): AMMONIA in the last 168 hours. Coagulation Profile: No results for input(s): INR, PROTIME in the last 168 hours. Cardiac Enzymes: No results for input(s): CKTOTAL, CKMB, CKMBINDEX, TROPONINI in the last 168 hours. BNP (last 3 results) No results for input(s): PROBNP in the last 8760 hours. HbA1C: No results for input(s): HGBA1C in the last 72 hours. CBG: No results for input(s): GLUCAP in the last 168 hours. Lipid Profile: No results for  input(s): CHOL, HDL, LDLCALC, TRIG, CHOLHDL, LDLDIRECT in the last 72 hours. Thyroid Function Tests: No results for input(s): TSH, T4TOTAL, FREET4, T3FREE, THYROIDAB in the last 72 hours. Anemia Panel: No results for input(s): VITAMINB12, FOLATE, FERRITIN, TIBC, IRON, RETICCTPCT in the last 72 hours. Urine analysis:    Component Value Date/Time   COLORURINE YELLOW 08/13/2016 0729   APPEARANCEUR CLEAR 08/13/2016 0729   LABSPEC 1.018 08/13/2016 0729   PHURINE 6.0 08/13/2016 0729   GLUCOSEU NEGATIVE 08/13/2016 0729   HGBUR NEGATIVE 08/13/2016 0729   BILIRUBINUR NEGATIVE 08/13/2016 0729   KETONESUR NEGATIVE 08/13/2016 0729   PROTEINUR NEGATIVE 08/13/2016 0729   UROBILINOGEN 0.2 06/03/2015 1303   NITRITE NEGATIVE 08/13/2016 0729   LEUKOCYTESUR NEGATIVE 08/13/2016 0729   Sepsis Labs: @LABRCNTIP (procalcitonin:4,lacticidven:4)   )No results found for this or any previous visit (from the past 240 hour(s)).    Radiology Studies: Dg Chest Port 1 View Result Date: 08/13/2016 No active disease. Electronically Signed   By: Franchot Gallo [name]   On: 08/13/2016 08:15    Scheduled Meds: . amLODipine  10 mg Oral Daily  . atorvastatin  40 mg Oral Daily  . finasteride  5 mg Oral Daily  . sodium chloride flush  3 mL Intravenous Q12H   Continuous Infusions: . sodium chloride 500 mL (08/14/16 1022)     LOS: 0 days    Time spent: 15 minutes  Greater than 50% of the time spent on counseling and coordinating the care.   Leisa Lenz, MD [facility] Pager [phone]  If 7PM-7AM, please contact night-coverage [email or web address] Password [facility] 08/14/2016, 6:35 PM  Patient ID: [name], male   DOB: Jun 28, 1941, 75 y.o.   MRN: [id number]

## 2016-08-14 NOTE — Care Management Obs Status (Signed)
[place] NOTIFICATION   Patient Details  Name: [name] MRN: [id number] Date of Birth: [birth year]   Medicare Observation Status Notification Given:  Yes    Dawayne Patricia, RN 08/14/2016, 2:42 PM

## 2016-08-15 ENCOUNTER — Encounter (HOSPITAL_COMMUNITY): Payer: Self-pay | Admitting: Gastroenterology

## 2016-08-15 ENCOUNTER — Ambulatory Visit: Payer: Medicare Other

## 2016-08-15 DIAGNOSIS — K625 Hemorrhage of anus and rectum: Secondary | ICD-10-CM | POA: Diagnosis not present

## 2016-08-15 DIAGNOSIS — F419 Anxiety disorder, unspecified: Secondary | ICD-10-CM | POA: Diagnosis not present

## 2016-08-15 DIAGNOSIS — K922 Gastrointestinal hemorrhage, unspecified: Secondary | ICD-10-CM | POA: Diagnosis not present

## 2016-08-15 LAB — CBC
HCT: 39.6 % (ref 39.0–52.0)
Hemoglobin: 13.2 g/dL (ref 13.0–17.0)
MCH: 30.3 pg (ref 26.0–34.0)
MCHC: 33.3 g/dL (ref 30.0–36.0)
MCV: 90.8 fL (ref 78.0–100.0)
PLATELETS: 262 10*3/uL (ref 150–400)
RBC: 4.36 MIL/uL (ref 4.22–5.81)
RDW: 12.6 % (ref 11.5–15.5)
WBC: 13.5 10*3/uL — AB (ref 4.0–10.5)

## 2016-08-15 MED ORDER — HYDROCHLOROTHIAZIDE 25 MG PO TABS: 25.0000 mg | ORAL_TABLET | Freq: Every day | ORAL | 0 refills | Status: DC

## 2016-08-15 NOTE — Discharge Summary (Signed)
Physician Discharge Summary  [name] MRN: [id number] DOB/AGE: 77-Aug-1942 77 y.o.  PCP: Monico Blitz, MD   Admit date: 08/13/2016 Discharge date: 08/15/2016  Discharge Diagnoses:    Principal Problem:   BRBPR (bright red blood per rectum) Active Problems:   Dizziness   Complete heart block (HCC)   GI bleed   Hypertension   Cerebrovascular disease   Anxiety   Leukocytosis   CAD (coronary artery disease)   CKD (chronic kidney disease) stage 2, GFR 60-89 ml/min    Follow-up recommendations Follow-up with PCP in 3-5 days , including all  additional recommended appointments as below Follow-up CBC, CMP in 3-5 days Patient to call GI Manus Gunning, MD, to schedule hemorrhoidal banding      Current Discharge Medication List    CONTINUE these medications which have CHANGED   Details  hydrochlorothiazide (HYDRODIURIL) 25 MG tablet Take 1 tablet (25 mg total) by mouth daily. Qty: 30 tablet, Refills: 0      CONTINUE these medications which have NOT CHANGED   Details  amLODipine (NORVASC) 10 MG tablet Take 10 mg by mouth daily.    aspirin 81 MG tablet Take 81 mg by mouth daily.    atorvastatin (LIPITOR) 40 MG tablet Take 1 tablet (40 mg total) by mouth daily. Qty: 30 tablet, Refills: 6    finasteride (PROSCAR) 5 MG tablet Take 5 mg by mouth daily.         Discharge Condition: Stable    Discharge Instructions Get Medicines reviewed and adjusted: Please take all your medications with you for your next visit with your Primary MD  Please request your Primary MD to go over all hospital tests and procedure/radiological results at the follow up, please ask your Primary MD to get all Hospital records sent to his/her office.  If you experience worsening of your admission symptoms, develop shortness of breath, life threatening emergency, suicidal or homicidal thoughts you must seek medical attention immediately by calling 911 or calling your MD immediately  if symptoms less severe.  You must read complete instructions/literature along with all the possible adverse reactions/side effects for all the Medicines you take and that have been prescribed to you. Take any new Medicines after you have completely understood and accpet all the possible adverse reactions/side effects.   Do not drive when taking Pain medications.   Do not take more than prescribed Pain, Sleep and Anxiety Medications  Special Instructions: If you have smoked or chewed Tobacco in the last 2 yrs please stop smoking, stop any regular Alcohol and or any Recreational drug use.  Wear Seat belts while driving.  Please note  You were cared for by a hospitalist during your hospital stay. Once you are discharged, your primary care physician will handle any further medical issues. Please note that NO REFILLS for any discharge medications will be authorized once you are discharged, as it is imperative that you return to your primary care physician (or establish a relationship with a primary care physician if you do not have one) for your aftercare needs so that they can reassess your need for medications and monitor your lab values.     Allergies  Allergen Reactions  . Strawberry Extract Shortness Of Breath and Swelling  . Ace Inhibitors     POSSIBLE ANGIOEDEMA  . Cardura [Doxazosin Mesylate] Other (See Comments)    Erythema  . Penicillins Rash    RASH      Disposition: 01-Home or Self Care   Consults: GI  Significant Diagnostic Studies:  Dg Chest 2 View  Result Date: 08/02/2016 CLINICAL DATA:  Cardiac arrhythmia with pacemaker insertion EXAM: CHEST  2 VIEW COMPARISON:  August 01, 2016 FINDINGS: Pacemaker is present on the left with lead tips attached to the right atrium and right ventricle. No pneumothorax. There is no edema or consolidation. Heart is upper normal in size with pulmonary vascularity within normal limits. There is no adenopathy. There is mild  degenerative change in the thoracic spine. IMPRESSION: Pacemaker leads attached to right atrium and right ventricle. No pneumothorax. No edema or consolidation. Stable cardiac silhouette. Electronically Signed   By: Lowella Grip III [name]   On: 08/02/2016 07:30   Dg Chest Port 1 View  Result Date: 08/13/2016 CLINICAL DATA:  Leukocytosis.  Recent pacemaker EXAM: PORTABLE CHEST 1 VIEW COMPARISON:  08/02/2016 FINDINGS: Lungs remain clear. No infiltrate or effusion. Negative for heart failure. Dual lead pacemaker unchanged in position. IMPRESSION: No active disease. Electronically Signed   By: Franchot Gallo [name]   On: 08/13/2016 08:15   Dg Chest Portable 1 View  Result Date: 08/01/2016 CLINICAL DATA:  Chest pain and SOB since yesterday - pt has a hx of carotid issues, stroke, htn - unknown lung issues EXAM: PORTABLE CHEST 1 VIEW COMPARISON:  10/30/2015 FINDINGS: External pacer/ defibrillator. Midline trachea. Mild cardiomegaly. No pleural effusion or pneumothorax. No congestive failure. Clear lungs. Patient minimally rotated right. IMPRESSION: Cardiomegaly without congestive failure. Electronically Signed   By: Abigail Miyamoto [name]   On: 08/01/2016 11:06        Labs: Results for orders placed or performed during the hospital encounter of 08/13/16 (from the past 48 hour(s))  CBC     Status: Abnormal   Collection Time: 08/13/16  4:02 PM  Result Value Ref Range   WBC 14.9 (H) 4.0 - 10.5 K/uL   RBC 4.61 4.22 - 5.81 MIL/uL   Hemoglobin 14.5 13.0 - 17.0 g/dL   HCT 41.2 39.0 - 52.0 %   MCV 89.4 78.0 - 100.0 fL   MCH 31.5 26.0 - 34.0 pg   MCHC 35.2 30.0 - 36.0 g/dL   RDW 12.5 11.5 - 15.5 %   Platelets 262 150 - 400 K/uL  Basic metabolic panel     Status: Abnormal   Collection Time: 08/14/16  3:07 AM  Result Value Ref Range   Sodium 141 135 - 145 mmol/L   Potassium 3.6 3.5 - 5.1 mmol/L   Chloride 105 101 - 111 mmol/L   CO2 28 22 - 32 mmol/L   Glucose, Bld 137 (H) 65 - 99 mg/dL   BUN 16 6 -  20 mg/dL   Creatinine, Ser 1.22 0.61 - 1.24 mg/dL   Calcium 8.9 8.9 - 10.3 mg/dL   GFR calc non Af Amer 55 (L) >60 mL/min   GFR calc Af Amer >60 >60 mL/min    Comment: (NOTE) The eGFR has been calculated using the CKD EPI equation. This calculation has not been validated in all clinical situations. eGFR's persistently <60 mL/min signify possible Chronic Kidney Disease.    Anion gap 8 5 - 15  CBC     Status: Abnormal   Collection Time: 08/14/16  3:07 AM  Result Value Ref Range   WBC 15.1 (H) 4.0 - 10.5 K/uL   RBC 4.73 4.22 - 5.81 MIL/uL   Hemoglobin 14.6 13.0 - 17.0 g/dL   HCT 42.8 39.0 - 52.0 %   MCV 90.5 78.0 - 100.0 fL   MCH 30.9  26.0 - 34.0 pg   MCHC 34.1 30.0 - 36.0 g/dL   RDW 12.5 11.5 - 15.5 %   Platelets 293 150 - 400 K/uL  CBC     Status: Abnormal   Collection Time: 08/15/16  2:28 AM  Result Value Ref Range   WBC 13.5 (H) 4.0 - 10.5 K/uL   RBC 4.36 4.22 - 5.81 MIL/uL   Hemoglobin 13.2 13.0 - 17.0 g/dL   HCT 39.6 39.0 - 52.0 %   MCV 90.8 78.0 - 100.0 fL   MCH 30.3 26.0 - 34.0 pg   MCHC 33.3 30.0 - 36.0 g/dL   RDW 12.6 11.5 - 15.5 %   Platelets 262 150 - 400 K/uL     Lipid Panel     Component Value Date/Time   CHOL 101 06/02/2015 0401   TRIG 108 06/02/2015 0401   HDL 37 (L) 06/02/2015 0401   CHOLHDL 2.7 06/02/2015 0401   VLDL 22 06/02/2015 0401   LDLCALC 42 06/02/2015 0401           HPI :   75 y.o. male with history of PVD, severe CAD, and hx of CVA. He is s/p PPM 08/05/16 after presenting to ED with complete heart block.   Patient presented to ED last pm for painless rectal bleeding. Patient was lying in bed around 7pm and felt something wet. Bright red blood had come of of his rectum. He had a second episode around 10pm. No associated abdominal or rectal pain. No associated constipation. He did not become lightheaded / SOB. Patient describes significant lower GI bleed approx 6 months ago at [facility] Apparently he was on drug similar to plavix at  the time. Patient doesn't think he had a colonoscopy done but prescription antiplatelet med stopped and he was placed on ASA. Patient doesn't know when his last colonoscopy was, maybe greater than 10 year ago.   HOSPITAL COURSE: *  BRBPR (bright red blood per rectum) / Acute lower GI bleed  - Hemoglobin remained stable Status post colonoscopy 10/19 Patient was found to have a nonthrombosed external hemorrhoid and perianal skin tags, One 5 mm polyp in the descending colon, removed    with a cold snare. Resected and retrieved.Non-bleeding large internal hemorrhoids. High-fiber diet recommended. Patient advised to call GI to schedule hemorrhoid banding in the next 1-2 weeks - Hgb stable, WNL Advised to resume aspirin without any contraindications, no other NSAIDs      Complete heart block (HCC) - Recent pacemaker placement     Essential hypertension - Continue norvasc    Dyslipidemia - Continue atorvastatin    CKD (chronic kidney disease) stage 2, GFR 60-89 ml/min - Cr 1.36 on admission and subsequent lab showed norma Cr     Leukocytosis - Unclear etiology - No evidence of acute infectious process - CXR showed no active disease  - UA negative      Discharge Exam  Blood pressure 108/68, pulse 76, temperature 98.1 F (36.7 C), temperature source Oral, resp. rate 18, SpO2 96 %.   General exam: Appears calm and comfortable  Respiratory system: Clear to auscultation. Respiratory effort normal. Cardiovascular system: S1 & S2 heard, Rate controlled  Gastrointestinal system: Abdomen is nondistended, soft and nontender. No organomegaly or masses felt. Normal bowel sounds heard. Central nervous system: Alert and oriented. No focal neurological deficits. Extremities: Symmetric 5 x 5 power. Skin: No rashes, lesions or ulcers Psychiatry: Judgement and insight appear normal. Mood & affect appropriate.    Follow-up Information  SHAH,ASHISH, MD. Schedule an appointment as soon  as possible for a visit in 2 day(s).   Specialty:  Internal Medicine Why:  Call PCP to make this appointment as soon as possible Contact information: [place] [address] [phone]        Manus Gunning, MD. Call in 2 day(s).   Specialty:  Gastroenterology Why:  office to  schedule a banding a outpatient in the next 1-2  weeks Contact information: [place] [address] [phone]           Signed: Reyne Dumas 08/15/2016, 9:20 AM        Time spent >45 mins

## 2016-08-25 ENCOUNTER — Encounter: Payer: Self-pay | Admitting: Cardiology

## 2016-08-26 NOTE — Progress Notes (Signed)
Letter mailed with results. 

## 2016-09-02 ENCOUNTER — Encounter: Payer: Medicare Other | Admitting: Gastroenterology

## 2016-09-05 ENCOUNTER — Telehealth: Payer: Self-pay

## 2016-09-05 NOTE — Telephone Encounter (Signed)
Per Dr Havery Moros pt can come in 09/11/16 at 11:30 for Hemorrhoid Banding. Left message for pt to return call. Sherri will need to open this slot up. Thank you.

## 2016-09-10 NOTE — Telephone Encounter (Signed)
Pt is unable to come 09/11/16. Will wait until 12/4.

## 2016-09-29 ENCOUNTER — Encounter: Payer: Self-pay | Admitting: Gastroenterology

## 2016-09-29 ENCOUNTER — Ambulatory Visit (INDEPENDENT_AMBULATORY_CARE_PROVIDER_SITE_OTHER): Payer: Medicare Other | Admitting: Gastroenterology

## 2016-09-29 VITALS — BP 110/74 | HR 88 | Ht 68.0 in | Wt 216.0 lb

## 2016-09-29 DIAGNOSIS — K641 Second degree hemorrhoids: Secondary | ICD-10-CM | POA: Diagnosis not present

## 2016-09-29 MED ORDER — POLYETHYLENE GLYCOL 3350 17 GM/SCOOP PO POWD: 1.0000 | Freq: Two times a day (BID) | ORAL | 3 refills | Status: DC

## 2016-09-29 NOTE — Patient Instructions (Signed)
If you are age 77 or older, your body mass index should be between 23-30. Your Body mass index is 32.84 kg/m. If this is out of the aforementioned range listed, please consider follow up with your Primary Care Provider.  If you are age 36 or younger, your body mass index should be between 19-25. Your Body mass index is 32.84 kg/m. If this is out of the aformentioned range listed, please consider follow up with your Primary Care Provider.   We have sent the following medications to your pharmacy for you to pick up at your convenience:  Miralax. Take twice daily  HEMORRHOID BANDING PROCEDURE    FOLLOW-UP CARE   1. The procedure you have had should have been relatively painless since the banding of the area involved does not have nerve endings and there is no pain sensation.  The rubber band cuts off the blood supply to the hemorrhoid and the band may fall off as soon as 48 hours after the banding (the band may occasionally be seen in the toilet bowl following a bowel movement). You may notice a temporary feeling of fullness in the rectum which should respond adequately to plain Tylenol or Motrin.  2. Following the banding, avoid strenuous exercise that evening and resume full activity the next day.  A sitz bath (soaking in a warm tub) or bidet is soothing, and can be useful for cleansing the area after bowel movements.     3. To avoid constipation, take two tablespoons of natural wheat bran, natural oat bran, flax, Benefiber or any over the counter fiber supplement and increase your water intake to 7-8 glasses daily.    4. Unless you have been prescribed anorectal medication, do not put anything inside your rectum for two weeks: No suppositories, enemas, fingers, etc.  5. Occasionally, you may have more bleeding than usual after the banding procedure.  This is often from the untreated hemorrhoids rather than the treated one.  Don't be concerned if there is a tablespoon or so of blood.  If  there is more blood than this, lie flat with your bottom higher than your head and apply an ice pack to the area. If the bleeding does not stop within a half an hour or if you feel faint, call our office at [phone] or go to the emergency room.  6. Problems are not common; however, if there is a substantial amount of bleeding, severe pain, chills, fever or difficulty passing urine (very rare) or other problems, you should call us at [phone] or report to the nearest emergency room.  7. Do not stay seated continuously for more than 2-3 hours for a day or two after the procedure.  Tighten your buttock muscles 10-15 times every two hours and take 10-15 deep breaths every 1-2 hours.  Do not spend more than a few minutes on the toilet if you cannot empty your bowel; instead re-visit the toilet at a later time.    Please follow up with Dr Havery Moros 10/21/16 at 3:45.  Thank you.

## 2016-09-29 NOTE — Progress Notes (Signed)
PROCEDURE NOTE: The patient presents with symptomatic grade II  hemorrhoids, requesting rubber band ligation of his/her hemorrhoidal disease.  All risks, benefits and alternative forms of therapy were described and informed consent was obtained.  In the Left Lateral Decubitus position anoscopic examination revealed grade II hemorrhoids in all position(s).  The anorectum was pre-medicated with 0.125% nitroglycerin The decision was made to band the RA internal hemorrhoid, and the Teague was used to perform band ligation without complication.  Digital anorectal examination was then performed to assure proper positioning of the band, and to adjust the banded tissue as required.  The patient was discharged home without pain or other issues.  Dietary and behavioral recommendations were given and along with follow-up instructions.     The following adjunctive treatments were recommended: miralax daily to BID to prevent straining, treat constipation  The patient will return in 2-4 weeks for  follow-up and possible additional banding as required. No complications were encountered and the patient tolerated the procedure well.  [place] Cellar, MD [facility] Gastroenterology Pager [phone]

## 2016-09-30 ENCOUNTER — Telehealth: Payer: Self-pay | Admitting: Gastroenterology

## 2016-09-30 NOTE — Telephone Encounter (Signed)
Pharmacy informed that pt may mix the Miralax powder in 8 oz of water or 1 capful twice daily.

## 2016-10-21 ENCOUNTER — Encounter: Payer: Medicare Other | Admitting: Gastroenterology

## 2016-11-07 ENCOUNTER — Encounter: Payer: Medicare Other | Admitting: Gastroenterology

## 2016-11-19 ENCOUNTER — Encounter: Payer: Medicare Other | Admitting: Gastroenterology

## 2016-11-24 ENCOUNTER — Emergency Department (HOSPITAL_COMMUNITY): Payer: Medicare Other

## 2016-11-24 ENCOUNTER — Encounter (HOSPITAL_COMMUNITY): Payer: Self-pay

## 2016-11-24 ENCOUNTER — Observation Stay (HOSPITAL_COMMUNITY): Payer: Medicare Other

## 2016-11-24 ENCOUNTER — Observation Stay (HOSPITAL_COMMUNITY)
Admission: EM | Admit: 2016-11-24 | Discharge: 2016-11-27 | Disposition: A | Discharge status: Home / Self Care | Payer: Medicare Other | Attending: Internal Medicine | Admitting: Internal Medicine

## 2016-11-24 DIAGNOSIS — I10 Essential (primary) hypertension: Secondary | ICD-10-CM | POA: Diagnosis present

## 2016-11-24 DIAGNOSIS — R4781 Slurred speech: Secondary | ICD-10-CM | POA: Diagnosis not present

## 2016-11-24 DIAGNOSIS — G459 Transient cerebral ischemic attack, unspecified: Principal | ICD-10-CM | POA: Insufficient documentation

## 2016-11-24 DIAGNOSIS — I252 Old myocardial infarction: Secondary | ICD-10-CM | POA: Insufficient documentation

## 2016-11-24 DIAGNOSIS — N182 Chronic kidney disease, stage 2 (mild): Secondary | ICD-10-CM | POA: Diagnosis not present

## 2016-11-24 DIAGNOSIS — E669 Obesity, unspecified: Secondary | ICD-10-CM | POA: Insufficient documentation

## 2016-11-24 DIAGNOSIS — I2510 Atherosclerotic heart disease of native coronary artery without angina pectoris: Secondary | ICD-10-CM | POA: Diagnosis not present

## 2016-11-24 DIAGNOSIS — Z880 Allergy status to penicillin: Secondary | ICD-10-CM | POA: Diagnosis not present

## 2016-11-24 DIAGNOSIS — I2583 Coronary atherosclerosis due to lipid rich plaque: Secondary | ICD-10-CM | POA: Diagnosis not present

## 2016-11-24 DIAGNOSIS — Z8673 Personal history of transient ischemic attack (TIA), and cerebral infarction without residual deficits: Secondary | ICD-10-CM | POA: Insufficient documentation

## 2016-11-24 DIAGNOSIS — I6602 Occlusion and stenosis of left middle cerebral artery: Secondary | ICD-10-CM | POA: Diagnosis not present

## 2016-11-24 DIAGNOSIS — E785 Hyperlipidemia, unspecified: Secondary | ICD-10-CM | POA: Insufficient documentation

## 2016-11-24 DIAGNOSIS — Z79899 Other long term (current) drug therapy: Secondary | ICD-10-CM | POA: Diagnosis not present

## 2016-11-24 DIAGNOSIS — Z6831 Body mass index (BMI) 31.0-31.9, adult: Secondary | ICD-10-CM | POA: Diagnosis not present

## 2016-11-24 DIAGNOSIS — Z7982 Long term (current) use of aspirin: Secondary | ICD-10-CM | POA: Insufficient documentation

## 2016-11-24 DIAGNOSIS — Z888 Allergy status to other drugs, medicaments and biological substances status: Secondary | ICD-10-CM | POA: Insufficient documentation

## 2016-11-24 DIAGNOSIS — I639 Cerebral infarction, unspecified: Secondary | ICD-10-CM | POA: Diagnosis not present

## 2016-11-24 DIAGNOSIS — Z91018 Allergy to other foods: Secondary | ICD-10-CM | POA: Insufficient documentation

## 2016-11-24 DIAGNOSIS — N183 Chronic kidney disease, stage 3 (moderate): Secondary | ICD-10-CM | POA: Diagnosis not present

## 2016-11-24 DIAGNOSIS — I442 Atrioventricular block, complete: Secondary | ICD-10-CM | POA: Diagnosis not present

## 2016-11-24 DIAGNOSIS — E876 Hypokalemia: Secondary | ICD-10-CM | POA: Diagnosis not present

## 2016-11-24 DIAGNOSIS — I129 Hypertensive chronic kidney disease with stage 1 through stage 4 chronic kidney disease, or unspecified chronic kidney disease: Secondary | ICD-10-CM | POA: Diagnosis not present

## 2016-11-24 DIAGNOSIS — I739 Peripheral vascular disease, unspecified: Secondary | ICD-10-CM | POA: Diagnosis not present

## 2016-11-24 DIAGNOSIS — I6523 Occlusion and stenosis of bilateral carotid arteries: Secondary | ICD-10-CM | POA: Diagnosis not present

## 2016-11-24 DIAGNOSIS — Z7902 Long term (current) use of antithrombotics/antiplatelets: Secondary | ICD-10-CM | POA: Insufficient documentation

## 2016-11-24 DIAGNOSIS — Z950 Presence of cardiac pacemaker: Secondary | ICD-10-CM | POA: Insufficient documentation

## 2016-11-24 DIAGNOSIS — N400 Enlarged prostate without lower urinary tract symptoms: Secondary | ICD-10-CM | POA: Diagnosis not present

## 2016-11-24 HISTORY — DX: Chronic kidney disease, stage 3 unspecified: N18.30

## 2016-11-24 HISTORY — DX: Chronic kidney disease, stage 3 (moderate): N18.3

## 2016-11-24 LAB — COMPREHENSIVE METABOLIC PANEL
ALBUMIN: 4.2 g/dL (ref 3.5–5.0)
ALT: 25 U/L (ref 17–63)
AST: 27 U/L (ref 15–41)
Alkaline Phosphatase: 82 U/L (ref 38–126)
Anion gap: 14 (ref 5–15)
BUN: 18 mg/dL (ref 6–20)
CHLORIDE: 98 mmol/L — AB (ref 101–111)
CO2: 24 mmol/L (ref 22–32)
CREATININE: 1.18 mg/dL (ref 0.61–1.24)
Calcium: 9.7 mg/dL (ref 8.9–10.3)
GFR calc Af Amer: >60 mL/min (ref >60)
GFR calc non Af Amer: 57 mL/min — ABNORMAL LOW (ref >60)
Glucose, Bld: 121 mg/dL — ABNORMAL HIGH (ref 65–99)
POTASSIUM: 3.4 mmol/L — AB (ref 3.5–5.1)
SODIUM: 136 mmol/L (ref 135–145)
Total Bilirubin: 1 mg/dL (ref 0.3–1.2)
Total Protein: 7.6 g/dL (ref 6.5–8.1)

## 2016-11-24 LAB — CBC
HEMATOCRIT: 44 % (ref 39.0–52.0)
Hemoglobin: 15.3 g/dL (ref 13.0–17.0)
MCH: 31.1 pg (ref 26.0–34.0)
MCHC: 34.8 g/dL (ref 30.0–36.0)
MCV: 89.4 fL (ref 78.0–100.0)
PLATELETS: 242 10*3/uL (ref 150–400)
RBC: 4.92 MIL/uL (ref 4.22–5.81)
RDW: 12.8 % (ref 11.5–15.5)
WBC: 16.1 10*3/uL — AB (ref 4.0–10.5)

## 2016-11-24 LAB — URINALYSIS, ROUTINE W REFLEX MICROSCOPIC
Bacteria, UA: NONE SEEN
Bilirubin Urine: NEGATIVE
GLUCOSE, UA: NEGATIVE mg/dL
HGB URINE DIPSTICK: NEGATIVE
KETONES UR: NEGATIVE mg/dL
Nitrite: NEGATIVE
PROTEIN: NEGATIVE mg/dL
Specific Gravity, Urine: 1.004 — ABNORMAL LOW (ref 1.005–1.030)
Squamous Epithelial / LPF: NONE SEEN
pH: 7 (ref 5.0–8.0)

## 2016-11-24 LAB — I-STAT CHEM 8, ED
BUN: 23 mg/dL — ABNORMAL HIGH (ref 6–20)
CHLORIDE: 98 mmol/L — AB (ref 101–111)
CREATININE: 1.3 mg/dL — AB (ref 0.61–1.24)
Calcium, Ion: 1.08 mmol/L — ABNORMAL LOW (ref 1.15–1.40)
GLUCOSE: 124 mg/dL — AB (ref 65–99)
HEMATOCRIT: 45 % (ref 39.0–52.0)
HEMOGLOBIN: 15.3 g/dL (ref 13.0–17.0)
POTASSIUM: 3.4 mmol/L — AB (ref 3.5–5.1)
Sodium: 138 mmol/L (ref 135–145)
TCO2: 31 mmol/L (ref 0–100)

## 2016-11-24 LAB — APTT: APTT: 33 s (ref 24–36)

## 2016-11-24 LAB — I-STAT TROPONIN, ED: Troponin i, poc: 0.01 ng/mL (ref 0.00–0.08)

## 2016-11-24 LAB — DIFFERENTIAL
BASOS ABS: 0.1 10*3/uL (ref 0.0–0.1)
BASOS PCT: 0 %
EOS ABS: 0.3 10*3/uL (ref 0.0–0.7)
Eosinophils Relative: 2 %
Lymphocytes Relative: 22 %
Lymphs Abs: 3.5 10*3/uL (ref 0.7–4.0)
Monocytes Absolute: 1.5 10*3/uL — ABNORMAL HIGH (ref 0.1–1.0)
Monocytes Relative: 9 %
NEUTROS ABS: 10.8 10*3/uL — AB (ref 1.7–7.7)
NEUTROS PCT: 67 %

## 2016-11-24 LAB — PROTIME-INR
INR: 1.03
PROTHROMBIN TIME: 13.5 s (ref 11.4–15.2)

## 2016-11-24 MED ORDER — ACETAMINOPHEN 650 MG RE SUPP: 650.0000 mg | RECTAL | Status: DC | PRN

## 2016-11-24 MED ORDER — ACETAMINOPHEN 325 MG PO TABS
650.0000 mg | ORAL_TABLET | ORAL | Status: DC | PRN
Start: 2016-11-24 — End: 2016-11-27

## 2016-11-24 MED ORDER — ENOXAPARIN SODIUM 40 MG/0.4ML ~~LOC~~ SOLN
40.0000 mg | Freq: Every day | SUBCUTANEOUS | Status: DC
  Administered 2016-11-25 – 2016-11-26 (×2): 40 mg via SUBCUTANEOUS
  Filled 2016-11-24 (×4): qty 0.4

## 2016-11-24 MED ORDER — STROKE: EARLY STAGES OF RECOVERY BOOK
Freq: Once | Status: AC
  Administered 2016-11-25: 22:00:00
  Filled 2016-11-24: qty 1

## 2016-11-24 MED ORDER — SENNOSIDES-DOCUSATE SODIUM 8.6-50 MG PO TABS
1.0000 | ORAL_TABLET | Freq: Every evening | ORAL | Status: DC | PRN
  Filled 2016-11-24: qty 1

## 2016-11-24 MED ORDER — ACETAMINOPHEN 160 MG/5ML PO SOLN: 650.0000 mg | ORAL | Status: DC | PRN

## 2016-11-24 MED ORDER — ASPIRIN 325 MG PO TABS
325.0000 mg | ORAL_TABLET | Freq: Every day | ORAL | Status: DC
  Administered 2016-11-25 – 2016-11-27 (×4): 325 mg via ORAL
  Filled 2016-11-24 (×4): qty 1

## 2016-11-24 MED ORDER — ASPIRIN 300 MG RE SUPP: 300.0000 mg | Freq: Every day | RECTAL | Status: DC

## 2016-11-24 NOTE — Consult Note (Signed)
Neurology Consultation Reason for Consult: Slurred speech Referring Physician: Robyn Haber  CC: Slurred speech  History is obtained from: Patient  HPI: [name] is a 76 y.o. male who was in his normal state of health around 3 PM when he said hello to somebody. Subsequently when his family arrived home around 4 he noticed that his speech was slurred and initially worsened, but has subsequently improved some. He also felt like he had some trouble with his right arm earlier, but this appears to be mostly resolved. He continues, however, to have persistent dysarthria.  LKW: 3 PM tpa given?: no, mild symptoms  ROS: A 14 point ROS was performed and is negative except as noted in the HPI.   Past Medical History:  Diagnosis Date  . Anxiety   . Arthritis    "hands" (08/20/16)  . BPH (benign prostatic hypertrophy)   . BRBPR (bright red blood per rectum) ~ 01/2016; 10'/18/2017  . CAD (coronary artery disease) August 20, 2016  . Carotid artery occlusion   . Cerebrovascular disease, unspecified   . Cervicalgia   . Enlarged prostate   . Hypertension   . Inguinal hernia without mention of obstruction or gangrene, unilateral or unspecified, (not specified as recurrent)   . Myocardial infarction 2014; 2015; 2016   "told I'd had the 1st 2 while in the hospital in 2016; didn't even know it" (2016-08-20)  . Other psoriasis   . Pain in limb   . Presence of permanent cardiac pacemaker   . Situational depression    "after my daughter died in 01-Jan-2015" (08-20-16)  . Stroke [facility]) 2010; 2014   denies residual on 08/20/2016  . Unspecified hemorrhoids without mention of complication   . Unspecified transient cerebral ischemia   . Varicose veins      Family History  Problem Relation Age of Onset  . Heart attack Father   . Ovarian cancer Daughter   . Colon cancer Neg Hx   . Stomach cancer Neg Hx      Social History:  reports that he has never smoked. He has never used smokeless tobacco.  He reports that he does not drink alcohol or use drugs.   Exam: Current vital signs: BP 152/80   Pulse 60   Temp 98.4 F (36.9 C)   Resp 15   SpO2 95%  Vital signs in last 24 hours: Temp:  [98.4 F (36.9 C)] 98.4 F (36.9 C) (01/29 1857) Pulse Rate:  [60-61] 60 (01/29 1915) Resp:  [15-16] 15 (01/29 1915) BP: (144-152)/(58-80) 152/80 (01/29 1915) SpO2:  [95 %-98 %] 95 % (01/29 1915)   Physical Exam  Constitutional: Appears well-developed and well-nourished.  Psych: Affect appropriate to situation Eyes: No scleral injection HENT: No OP obstrucion Head: Normocephalic.  Cardiovascular: Normal rate and regular rhythm.  Respiratory: Effort normal and breath sounds normal to anterior ascultation GI: Soft.  No distension. There is no tenderness.  Skin: WDI  Neuro: Mental Status: Patient is awake, alert, oriented to person, place, month, year, and situation. Patient is able to give a clear and coherent history. No signs of aphasia or neglect Cranial Nerves: II: Visual Fields are full. Pupils are equal, round, and reactive to light.   III,IV, VI: EOMI without ptosis or diploplia.  V: Facial sensation is symmetric to temperature VII: Facial movement is ? Mild right facial droop, but on subsequent exams I do not see this. VIII: hearing is intact to voice X: Uvula elevates symmetrically XI: Shoulder shrug is symmetric. XII:  tongue is midline without atrophy or fasciculations.  Motor: Tone is normal. Bulk is normal. He has mild right arm weakness, though he does not drift. Good strength in all other extremities. Sensory: Sensation is symmetric to pinprick in the arms and legs. Cerebellar: FNF and HKS are intact bilaterally  I have reviewed labs in epic and the results pertinent to this consultation are: Chem 8-unremarkable other than borderline creatinine  I have reviewed the images obtained: CT head-no acute findings  Impression: 76 year old male with a history of  previous stroke with acute onset dysarthria and right arm weakness with subsequent improvement in his right arm weakness. My suspicion is that this does represent a small ischemic stroke, though with improvement TIAs hard to rule out. He does have a pacemaker, but this apparently is MRI compatible.  Recommendations: 1. HgbA1c, fasting lipid panel 2. MRI, MRA  of the brain without contrast 3. Frequent neuro checks 4. Echocardiogram 5. Carotid dopplers 6. Prophylactic therapy-Antiplatelet med: Aspirin - dose 325mg  PO or 300mg  PR 7. Risk factor modification 8. Telemetry monitoring 9. PT consult, OT consult, Speech consult 10. please page stroke NP  Or  PA  Or MD  from 8am -4 pm starting 1/30 as this patient will be followed by the stroke team at this point.   You can look them up on [email or web address]      Roland Rack, MD [facility] [phone]  If 7pm- 7am, please page neurology on call as listed in [place].

## 2016-11-24 NOTE — ED Triage Notes (Signed)
Patient here for a code stroke with slurred speech that began at 1650 this afternoon.  BP controlled and CBG WNL.  NIH scored a 2. Neurologist at bedside in [place]. A&Ox4

## 2016-11-24 NOTE — H&P (Signed)
History and Physical  Patient Name: KINNEY GUERECA     [id number]    DOB: 1940/12/28    DOA: 11/24/2016 PCP: Monico Blitz, MD   Patient coming from: Home     Chief Complaint: Slurred speech  HPI: TARAJI IANNUCCI is a 76 y.o. 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  The patient was in his usual state of health until around 4:30PM when he noticed suddenly he was slurring his speech and couldn't stop.  Around the same time, he felt he couldn't move his right arm well (this is the arm affected by his large stroke in 2010, for which he doesn't have baseline deficits).  The people he lives with noticed his speech was abnormal and he was transported to the ER.  ED course: -Afebrile, heart rate 61, respirations and pulse ox normal, BP 144/58 -Na 136, K 3.4, Cr 1.18 (baseline 1.1), WBC 16.1K, Hgb 15.3 -Coags normal, Troponin negative, UA clear -CT head without focal changes -ECG showed paced rhythm -He was evaluated by Neurology who felt his symptoms were resolving, recommended observation overnight for TIA vs Stroke and MRI in morning (given pacer)   Of note, admitted for rectal bleeding in last 6 months, this was from bleeding hemorrhoids, which were banded, he is still on baby aspirin daily.       Review of systems:  Review of Systems  Constitutional: Negative for chills, diaphoresis, fever and malaise/fatigue.  Respiratory: Negative for cough.   Cardiovascular: Negative for chest pain.  Musculoskeletal: Negative for myalgias.  Neurological: Positive for speech change and focal weakness. Negative for dizziness, sensory change, seizures and loss of consciousness.  All other systems reviewed and are negative.        Past Medical History:  Diagnosis Date  . Anxiety   . Arthritis    "hands" (17-Aug-2016)  . BPH (benign prostatic hypertrophy)   .  BRBPR (bright red blood per rectum) ~ 01/2016; 10'/18/2017  . CAD (coronary artery disease) Aug 17, 2016  . Carotid artery occlusion   . Cerebrovascular disease, unspecified   . Cervicalgia   . Enlarged prostate   . Hypertension   . Inguinal hernia without mention of obstruction or gangrene, unilateral or unspecified, (not specified as recurrent)   . Myocardial infarction 2014; 2015; 2016   "told I'd had the 1st 2 while in the hospital in 2016; didn't even know it" (Aug 17, 2016)  . Other psoriasis   . Pain in limb   . Presence of permanent cardiac pacemaker   . Situational depression    "after my daughter died in 12/29/2014" (08-17-16)  . Stroke [facility] 2010; 2014   denies residual on 08/17/2016  . Unspecified hemorrhoids without mention of complication   . Unspecified transient cerebral ischemia   . Varicose veins     Past Surgical History:  Procedure Laterality Date  . BACK SURGERY    . CARDIAC CATHETERIZATION N/A 06/06/2015   Procedure: Left Heart Cath and Coronary Angiography;  Surgeon: Wellington Hampshire, MD;  Location: [place] [facility];  Service: Cardiovascular;  Laterality: N/A;  . CAROTID ENDARTERECTOMY Right 10-13-13   cea  . COLONOSCOPY    . COLONOSCOPY N/A 08/14/2016   Procedure: COLONOSCOPY;  Surgeon: Manus Gunning, MD;  [facility] ENDOSCOPY;  Service: Gastroenterology;  Laterality: N/A;  . ENDARTERECTOMY Left 07/14/2013   Procedure: ENDARTERECTOMY CAROTID-LEFT;  Surgeon: Serafina Mitchell, MD;  [facility]: [facility] OR;  Service: Vascular;  Laterality: Left;  . ENDARTERECTOMY Right 10/13/2013   Procedure: RIGHT CAROTID ARTERY ENDARTERECTOMY WITH VASCU GUARD PATCH ANGIOPLASTY;  Surgeon: Serafina Mitchell, MD;  Location: [place];  Service: Vascular;  Laterality: Right;  . EP IMPLANTABLE DEVICE N/A 08/01/2016   Procedure: Pacemaker Implant;  Surgeon: Will Meredith Leeds, MD;  Location: [place] [facility];  Service: Cardiovascular;  Laterality: N/A;  . INGUINAL HERNIA REPAIR Left   .  INSERT / REPLACE / REMOVE PACEMAKER    . [place]   "ruptured disc"; Dr. Amado Coe  . PATCH ANGIOPLASTY Left 07/14/2013   Procedure: PATCH ANGIOPLASTY using Vascu-Guard Vascular Patch;  Surgeon: Serafina Mitchell, MD;  Location: [place];  Service: Vascular;  Laterality: Left;  Marland Kitchen VASECTOMY      Social History: Patient lives with friends of family (he is independent with all ADLs, but they provide some support).  Patient walks without a cane.  From [place].  Worked for [employer].  Wife deceased.  Only living family is developmentally delayed son.  Never smoker.    Allergies  Allergen Reactions  . Strawberry Extract Shortness Of Breath and Swelling    Lips and throat swelled  . Ace Inhibitors Other (See Comments)    Possible angioedema  . Cardura [Doxazosin Mesylate] Other (See Comments)    Erythema  . Penicillins Rash    Has patient had a PCN reaction causing immediate rash, facial/tongue/throat swelling, SOB or lightheadedness with hypotension: Yes Has patient had a PCN reaction causing severe rash involving mucus membranes or skin necrosis: No Has patient had a PCN reaction that required hospitalization No Has patient had a PCN reaction occurring within the last 10 years: No If all of the above answers are "NO", then may proceed with Cephalosporin use.    Family history: family history includes Heart attack in his father; Ovarian cancer in his daughter.  Prior to Admission medications   Medication Sig Start Date End Date Taking? Authorizing Provider  amLODipine (NORVASC) 10 MG tablet Take 10 mg by mouth daily.   Yes Historical Provider, MD  atorvastatin (LIPITOR) 40 MG tablet Take 1 tablet (40 mg total) by mouth daily. 08/02/16  Yes Erma Heritage, PA  finasteride (PROSCAR) 5 MG tablet Take 5 mg by mouth daily.   Yes Historical Provider, MD  hydrochlorothiazide (HYDRODIURIL) 25 MG tablet Take 1 tablet (25 mg total) by mouth daily. 08/18/16  Yes Reyne Dumas, MD      Physical Exam: BP 151/76   Pulse 68   Temp 98.4 F (36.9 C)   Resp 20   SpO2 96%  General appearance: Well-developed, elderly adult male, alert and in no acute distress.   Eyes: Anicteric, conjunctiva pink, lids and lashes normal. PERRL.    ENT: No nasal deformity, discharge, epistaxis.  Hearing normal. OP moist without lesions. Lymph: No cervical, supraclavicular or axillary lymphadenopathy. Skin: Warm and dry.  No jaundice.  No suspicious rashes or lesions. Cardiac: RRR, nl S1-S2, no murmurs appreciated.  Capillary refill is brisk.  JVP not visible.  No LE edema.  Radial and DP pulses 2+ and symmetric.  No carotid bruits. Respiratory: Normal respiratory rate and rhythm.  CTAB without rales or wheezes. GI: Abdomen soft without rigidity.  No TTP. No ascites, distension, no hepatosplenomegaly.   MSK: No deformities or effusions. Neuro: Pupils are 4 mm and reactive to 3 mm. Extraocular movements are intact, without nystagmus. Cranial nerve 5 is within normal limits.  Cranial nerve 7 is symmetrical. Cranial nerve 8 is within normal limits. Cranial nerves 9 and 10 reveal equal palate elevation. Cranial nerve 11 reveals sternocleidomastoid strong. Cranial nerve 12 is midline. I do not note a deficit in motor strength testing in the upper and lower extremities bilaterally with normal motor, tone and bulk. Romberg maneuver is negative for pathology. Finger-to-nose testing is within normal limits. Speech is fluent. Naming is grossly intact. Attention span and concentration are within normal limits.   Psych: The patient is oriented to time, place and person. Behavior appropriate.  Affect normal.  Recall, recent and remote, as well as general fund of knowledge seem within normal limits. No evidence of aural or visual hallucinations or delusions.       Labs on Admission:  I have personally reviewed following labs and imaging studies: CBC:  Recent Labs Lab 11/24/16 1832  11/24/16 1852  WBC 16.1*  --   NEUTROABS 10.8*  --   HGB 15.3 15.3  HCT 44.0 45.0  MCV 89.4  --   PLT 242  --    Basic Metabolic Panel:  Recent Labs Lab 11/24/16 1832 11/24/16 1852  NA 136 138  K 3.4* 3.4*  CL 98* 98*  CO2 24  --   GLUCOSE 121* 124*  BUN 18 23*  CREATININE 1.18 1.30*  CALCIUM 9.7  --    GFR: CrCl cannot be calculated (Unknown ideal weight.). Liver Function Tests:  Recent Labs Lab 11/24/16 1832  AST 27  ALT 25  ALKPHOS 82  BILITOT 1.0  PROT 7.6  ALBUMIN 4.2   No results for input(s): LIPASE, AMYLASE in the last 168 hours. No results for input(s): AMMONIA in the last 168 hours. Coagulation Profile:  Recent Labs Lab 11/24/16 1832  INR 1.03   Cardiac Enzymes: No results for input(s): CKTOTAL, CKMB, CKMBINDEX, TROPONINI in the last 168 hours. BNP (last 3 results) No results for input(s): PROBNP in the last 8760 hours. HbA1C: No results for input(s): HGBA1C in the last 72 hours. CBG: No results for input(s): GLUCAP in the last 168 hours. Lipid Profile: No results for input(s): CHOL, HDL, LDLCALC, TRIG, CHOLHDL, LDLDIRECT in the last 72 hours. Thyroid Function Tests: No results for input(s): TSH, T4TOTAL, FREET4, T3FREE, THYROIDAB in the last 72 hours. Anemia Panel: No results for input(s): VITAMINB12, FOLATE, FERRITIN, TIBC, IRON, RETICCTPCT in the last 72 hours. Sepsis Labs: Invalid input(s): PROCALCITONIN, LACTICIDVEN No results found for this or any previous visit (from the past 240 hour(s)).    Radiological Exams on Admission: Personally reviewed CT head report: Ct Head Code Stroke W/o Cm  Result Date: 11/24/2016 CLINICAL DATA:  Code stroke. Slurred speech and right-sided weakness. EXAM: CT HEAD WITHOUT CONTRAST TECHNIQUE: Contiguous axial images were obtained from the base of the skull through the vertex without intravenous contrast. COMPARISON:  06/01/2015 CT head. FINDINGS: Brain: No evidence of acute infarction, hemorrhage,  hydrocephalus, extra-axial collection or mass lesion/mass effect. Confluent hypoattenuation of white matter in the left-greater-than-right hemispheres is nonspecific but consistent with advanced chronic microvascular ischemic changes and mild brain parenchymal volume loss. There is a chronic lacunar infarct within the left lentiform nucleus. Vascular: Moderate calcific atherosclerosis of cavernous and paraclinoid internal carotid arteries. Skull: Normal. Negative for fracture or focal lesion. Sinuses/Orbits: No acute finding. Other: None. ASPECTS [facility] Stroke Program Early CT Score) - Ganglionic level infarction (caudate, lentiform nuclei, internal capsule, insula, M1-M3 cortex): 7 - Supraganglionic infarction (M4-M6 cortex): 3 Total score (0-10 with 10 being normal): 10  IMPRESSION: 1. No acute intracranial abnormality identified. If symptoms persist or if clinically indicated MRI is more sensitive for acute stroke. 2. Advanced chronic microvascular ischemic changes of the brain and mild brain parenchymal volume loss. 3. ASPECTS is 10 These results were called by telephone at the time of interpretation on 11/24/2016 at 6:56 pm to Dr. Flossie Dibble , who verbally acknowledged these results. Electronically Signed   By: Kristine Garbe [name]   On: 11/24/2016 18:58     EKG: Independently reviewed. Rate 66, QTc 507, nonspecific IVCD, paced rhythm.    Assessment/Plan  1. Acute Stroke vs TIA:  This is new.  MRI pending, will have to be done in AM given pacer.  ABCD 6 -Admit to telemetry -Neuro checks, NIHSS per protocol -Daily aspirin 325 mg -Lipids, hemoglobin A1c -Carotid doppler ordered -Echocardiogram ordered -MRI/MRA ordered -PT/OT/SLP consultation -Consult to Neurology, appreciate recommendations   2. Hypokalemia:  Mild. -Check mag -Oral replacement  3. Hypertension and coronary disease secondary prevention:  -Continue amlodipine and HCTZ and statin  4. CHB with  pacer placed in Oct 2017:   5. Other medications:  -Continue finasteride       DVT prophylaxis: Lovenox  Code Status: FULL  Family Communication: None prsent  Disposition Plan: Anticipate Stroke work up as above and consult to ancillary services.  Expect discharge within 1 day. Consults called: Neurology, Dr. Leonel Ramsay has seen patient. Admission status: Telemetry, OBS status  Core measures: -VTE prophylaxis ordered at time of admission -Aspirin ordered at admission -Atrial fibrillation: not present -tPA not given because of symptoms resolving -Dysphagia screen ordered in ER -Lipids ordered -PT eval ordered    Medical decision making: Patient seen at 9:00 PM on 11/24/2016.  The patient was discussed with Dr. Alvino Chapel. What exists of the patient's chart was reviewed in depth and summarized above.  Clinical condition: stable.       Edwin Dada [facility] Pager [phone]

## 2016-11-24 NOTE — ED Notes (Addendum)
MD states okay for pt to eat/drink. Provided him with [place] sandwich and coke.

## 2016-11-24 NOTE — ED Provider Notes (Signed)
[place] DEPT Provider Note   CSN: [id number] Arrival date & time: 11/24/16  1830   An emergency department physician performed an initial assessment on this suspected stroke patient at 01/01/29.  History   Chief Complaint Chief Complaint  Patient presents with  . Code Stroke    HPI ISAC LINCKS is a 76 y.o. male.  HPI Patient presented as a code stroke. Reportedly around 1 hour prior to arrival had difficulty speaking. Somewhat difficult to figure out the last time he actually was able to speak clearly since she had been by himself. Possibly normal at 4:30. States he may also feel little weak all over. Previous history of stroke and heart attacks. Has had some right-sided weakness with strokes and also said with one stroke he was unable speak at all and couldn't move either side. No headache. No confusion. No chest pain or trouble breathing. He is not on anticoagulation. Met by myself and Dr. Leonel Ramsay immediately upon arrival.     Past Medical History:  Diagnosis Date  . Anxiety   . Arthritis    "hands" (09-11-16)  . BPH (benign prostatic hypertrophy)   . BRBPR (bright red blood per rectum) ~ 01/2016; 10'/18/2017  . CAD (coronary artery disease) 09-11-2016  . Carotid artery occlusion   . Cerebrovascular disease, unspecified   . Cervicalgia   . Enlarged prostate   . Hypertension   . Inguinal hernia without mention of obstruction or gangrene, unilateral or unspecified, (not specified as recurrent)   . Myocardial infarction Mar 08, 201408-Mar-2015Mar 07, 2016   "told I'd had the 1st 2 while in the hospital in January 01, 2015; didn't even know it" (2016/09/11)  . Other psoriasis   . Pain in limb   . Presence of permanent cardiac pacemaker   . Situational depression    "after my daughter died in 01-01-15" (2016-09-11)  . Stroke [facility]) Jan 01, 2009; 01-01-13   denies residual on Sep 11, 2016  . Unspecified hemorrhoids without mention of complication   . Unspecified transient cerebral ischemia   . Varicose  veins     Patient Active Problem List   Diagnosis Date Noted  . GI bleed Sep 11, 2016  . BRBPR (bright red blood per rectum) 09/11/16  . Acute kidney injury ([place]) September 11, 2016  . Leukocytosis 2016/09/11  . CAD (coronary artery disease) September 11, 2016  . CKD (chronic kidney disease) stage 2, GFR 60-89 ml/min 11-Sep-2016  . Hypertension   . Cerebrovascular disease   . Anxiety   . Acute GI bleeding   . Essential hypertension   . Complete heart block ([place]) 08/01/2016  . Dizziness 06/02/2015  . LBBB (left bundle branch block) 06/02/2015  . Aftercare following surgery of the circulatory system, [place] 11/10/2013  . Carotid stenosis 10/13/2013  . Occlusion and stenosis of carotid artery without mention of cerebral infarction 07/04/2013    Past Surgical History:  Procedure Laterality Date  . BACK SURGERY    . CARDIAC CATHETERIZATION N/A 06/06/2015   Procedure: Left Heart Cath and Coronary Angiography;  Surgeon: Wellington Hampshire, MD;  Location: [place] [facility];  Service: Cardiovascular;  Laterality: N/A;  . CAROTID ENDARTERECTOMY Right 10-13-13   cea  . COLONOSCOPY    . COLONOSCOPY N/A 08/14/2016   Procedure: COLONOSCOPY;  Surgeon: Manus Gunning, MD;  Location: [facility] ENDOSCOPY;  Service: Gastroenterology;  Laterality: N/A;  . ENDARTERECTOMY Left 07/14/2013   Procedure: ENDARTERECTOMY CAROTID-LEFT;  Surgeon: Serafina Mitchell, MD;  Location: [place];  Service: Vascular;  Laterality: Left;  . ENDARTERECTOMY Right 10/13/2013  Procedure: RIGHT CAROTID ARTERY ENDARTERECTOMY WITH VASCU GUARD PATCH ANGIOPLASTY;  Surgeon: Nada Libman, MD;  Location[facility];  Service: Vascular;  Laterality: Right;  . EP IMPLANTABLE DEVICE N/A 08/01/2016   Procedure: Pacemaker Implant;  Surgeon: Will Jorja Loa, MD;  [facility];  Service: Cardiovascular;  Laterality: N/A;  . INGUINAL HERNIA REPAIR Left   . INSERT / REPLACE / REMOVE PACEMAKER    . LUMBAR DISC SURGERY  1992   "ruptured disc";  Dr. Arletha Grippe  . PATCH ANGIOPLASTY Left 07/14/2013   Procedure: PATCH ANGIOPLASTY using Vascu-Guard Vascular Patch;  Surgeon: Nada Libman, MD;  Location: [facility] OR;  Service: Vascular;  Laterality: Left;  Marland Kitchen VASECTOMY         Home Medications    Prior to Admission medications   Medication Sig Start Date End Date Taking? Authorizing Provider  amLODipine (NORVASC) 10 MG tablet Take 10 mg by mouth daily.    Historical Provider, MD  aspirin 81 MG tablet Take 81 mg by mouth daily.    Historical Provider, MD  atorvastatin (LIPITOR) 40 MG tablet Take 1 tablet (40 mg total) by mouth daily. 08/02/16   Ellsworth Lennox, PA  finasteride (PROSCAR) 5 MG tablet Take 5 mg by mouth daily.    Historical Provider, MD  hydrochlorothiazide (HYDRODIURIL) 25 MG tablet Take 1 tablet (25 mg total) by mouth daily. 08/18/16   Richarda Overlie, MD  polyethylene glycol powder (GLYCOLAX/MIRALAX) powder Take 255 g by mouth 2 (two) times daily. 09/29/16   Ruffin Frederick, MD    Family History Family History  Problem Relation Age of Onset  . Heart attack Father   . Ovarian cancer Daughter   . Colon cancer Neg Hx   . Stomach cancer Neg Hx     Social History Social History  Substance Use Topics  . Smoking status: Never Smoker  . Smokeless tobacco: Never Used  . Alcohol use No     Allergies   Strawberry extract; Ace inhibitors; Cardura [doxazosin mesylate]; and Penicillins   Review of Systems Review of Systems  Constitutional: Negative for activity change, appetite change and fatigue.  HENT: Negative for congestion.   Eyes: Negative for pain.  Respiratory: Negative for chest tightness and shortness of breath.   Cardiovascular: Negative for chest pain and leg swelling.  Gastrointestinal: Negative for abdominal pain, diarrhea, nausea and vomiting.  Genitourinary: Negative for flank pain.  Musculoskeletal: Negative for back pain and neck stiffness.  Skin: Negative for rash.  Neurological: Positive  for speech difficulty and weakness. Negative for numbness and headaches.  Psychiatric/Behavioral: Negative for behavioral problems.     Physical Exam Updated Vital Signs BP 152/80   Pulse 60   Temp 98.4 F (36.9 C)   Resp 15   SpO2 95%   Physical Exam  Constitutional: He appears well-developed.  HENT:  Head: Atraumatic.  Eyes: EOM are normal. Pupils are equal, round, and reactive to light.  Neck: Neck supple.  Cardiovascular: Normal rate.   Pulmonary/Chest: Effort normal.  Pacemaker to chest wall  Abdominal: Soft. There is no tenderness.  Neurological: He is alert.  Awake and appropriate. Mild slurred speech. Equal grip strength bilaterally. Face symmetric. Complete NIH scoring done by neurology with reported score of 1.  Skin: Skin is warm. Capillary refill takes less than 2 seconds.     ED Treatments / Results  Labs (all labs ordered are listed, but only abnormal results are displayed) Labs Reviewed  CBC - Abnormal; Notable  for the following:       Result Value   WBC 16.1 (*)    All other components within normal limits  DIFFERENTIAL - Abnormal; Notable for the following:    Neutro Abs 10.8 (*)    Monocytes Absolute 1.5 (*)    All other components within normal limits  COMPREHENSIVE METABOLIC PANEL - Abnormal; Notable for the following:    Potassium 3.4 (*)    Chloride 98 (*)    Glucose, Bld 121 (*)    GFR calc non Af Amer 57 (*)    All other components within normal limits  URINALYSIS, ROUTINE W REFLEX MICROSCOPIC - Abnormal; Notable for the following:    Color, Urine STRAW (*)    Specific Gravity, Urine 1.004 (*)    Leukocytes, UA SMALL (*)    All other components within normal limits  I-STAT CHEM 8, ED - Abnormal; Notable for the following:    Potassium 3.4 (*)    Chloride 98 (*)    BUN 23 (*)    Creatinine, Ser 1.30 (*)    Glucose, Bld 124 (*)    Calcium, Ion 1.08 (*)    All other components within normal limits  PROTIME-INR  APTT  I-STAT  TROPOININ, ED  CBG MONITORING, ED    EKG  EKG Interpretation  Date/Time:  Monday November 24 2016 18:51:55 EST Ventricular Rate:  66 PR Interval:    QRS Duration: 170 QT Interval:  483 QTC Calculation: 507 R Axis:   -115 Text Interpretation:  Sinus rhythm Nonspecific IVCD with LAD Anterolateral infarct, old Confirmed by Alvino Chapel  MD, Caydyn Sprung [phone]) on 11/24/2016 7:55:50 PM       Radiology Ct Head Code Stroke W/o Cm  Result Date: 11/24/2016 CLINICAL DATA:  Code stroke. Slurred speech and right-sided weakness. EXAM: CT HEAD WITHOUT CONTRAST TECHNIQUE: Contiguous axial images were obtained from the base of the skull through the vertex without intravenous contrast. COMPARISON:  06/01/2015 CT head. FINDINGS: Brain: No evidence of acute infarction, hemorrhage, hydrocephalus, extra-axial collection or mass lesion/mass effect. Confluent hypoattenuation of white matter in the left-greater-than-right hemispheres is nonspecific but consistent with advanced chronic microvascular ischemic changes and mild brain parenchymal volume loss. There is a chronic lacunar infarct within the left lentiform nucleus. Vascular: Moderate calcific atherosclerosis of cavernous and paraclinoid internal carotid arteries. Skull: Normal. Negative for fracture or focal lesion. Sinuses/Orbits: No acute finding. Other: None. ASPECTS [facility] Stroke Program Early CT Score) - Ganglionic level infarction (caudate, lentiform nuclei, internal capsule, insula, M1-M3 cortex): 7 - Supraganglionic infarction (M4-M6 cortex): 3 Total score (0-10 with 10 being normal): 10 IMPRESSION: 1. No acute intracranial abnormality identified. If symptoms persist or if clinically indicated MRI is more sensitive for acute stroke. 2. Advanced chronic microvascular ischemic changes of the brain and mild brain parenchymal volume loss. 3. ASPECTS is 10 These results were called by telephone at the time of interpretation on 11/24/2016 at 6:56 pm to Dr.  Flossie Dibble , who verbally acknowledged these results. Electronically Signed   By: Kristine Garbe [name]   On: 11/24/2016 18:58    Procedures Procedures (including critical care time)  Medications Ordered in ED Medications - No data to display   Initial Impression / Assessment and Plan / ED Course  I have reviewed the triage vital signs and the nursing notes.  Pertinent labs & imaging results that were available during my care of the patient were reviewed by me and considered in my medical decision making (see chart for  details).     Patient with difficulty speaking and likely stroke. Met by myself and neurology. Not a TPA candidate somewhat due to time of onset but also due to low severity of the deficits. Will admit to internal medicine.  Final Clinical Impressions(s) / ED Diagnoses   Final diagnoses:  Cerebrovascular accident (CVA), unspecified mechanism ([place])    New Prescriptions New Prescriptions   No medications on file     Davonna Belling, MD 11/24/16 1956

## 2016-11-25 ENCOUNTER — Encounter (HOSPITAL_COMMUNITY): Payer: Self-pay | Admitting: Radiology

## 2016-11-25 ENCOUNTER — Observation Stay (HOSPITAL_BASED_OUTPATIENT_CLINIC_OR_DEPARTMENT_OTHER): Payer: Medicare Other

## 2016-11-25 ENCOUNTER — Observation Stay (HOSPITAL_COMMUNITY): Payer: Medicare Other

## 2016-11-25 DIAGNOSIS — I2583 Coronary atherosclerosis due to lipid rich plaque: Secondary | ICD-10-CM

## 2016-11-25 DIAGNOSIS — I6602 Occlusion and stenosis of left middle cerebral artery: Secondary | ICD-10-CM

## 2016-11-25 DIAGNOSIS — R4781 Slurred speech: Secondary | ICD-10-CM | POA: Diagnosis not present

## 2016-11-25 DIAGNOSIS — E876 Hypokalemia: Secondary | ICD-10-CM

## 2016-11-25 DIAGNOSIS — N182 Chronic kidney disease, stage 2 (mild): Secondary | ICD-10-CM | POA: Diagnosis not present

## 2016-11-25 DIAGNOSIS — I10 Essential (primary) hypertension: Secondary | ICD-10-CM

## 2016-11-25 DIAGNOSIS — I2510 Atherosclerotic heart disease of native coronary artery without angina pectoris: Secondary | ICD-10-CM | POA: Diagnosis not present

## 2016-11-25 DIAGNOSIS — I639 Cerebral infarction, unspecified: Secondary | ICD-10-CM

## 2016-11-25 DIAGNOSIS — G451 Carotid artery syndrome (hemispheric): Secondary | ICD-10-CM

## 2016-11-25 DIAGNOSIS — I442 Atrioventricular block, complete: Secondary | ICD-10-CM

## 2016-11-25 LAB — LIPID PANEL
Cholesterol: 112 mg/dL (ref 0–200)
HDL: 38 mg/dL — AB (ref >40)
LDL Cholesterol: 47 mg/dL (ref 0–99)
TRIGLYCERIDES: 136 mg/dL (ref <150)
Total CHOL/HDL Ratio: 2.9 RATIO
VLDL: 27 mg/dL (ref 0–40)

## 2016-11-25 LAB — MAGNESIUM: Magnesium: 1.9 mg/dL (ref 1.7–2.4)

## 2016-11-25 LAB — CBG MONITORING, ED: GLUCOSE-CAPILLARY: 123 mg/dL — AB (ref 65–99)

## 2016-11-25 MED ORDER — POTASSIUM CHLORIDE CRYS ER 20 MEQ PO TBCR
40.0000 meq | EXTENDED_RELEASE_TABLET | Freq: Once | ORAL | Status: AC
  Administered 2016-11-25: 40 meq via ORAL
  Filled 2016-11-25: qty 2

## 2016-11-25 MED ORDER — ATORVASTATIN CALCIUM 80 MG PO TABS
80.0000 mg | ORAL_TABLET | Freq: Every day | ORAL | Status: DC
  Administered 2016-11-26 – 2016-11-27 (×2): 80 mg via ORAL
  Filled 2016-11-25 (×2): qty 1

## 2016-11-25 MED ORDER — CLOPIDOGREL BISULFATE 75 MG PO TABS
75.0000 mg | ORAL_TABLET | Freq: Every day | ORAL | Status: DC
  Administered 2016-11-26 – 2016-11-27 (×2): 75 mg via ORAL
  Filled 2016-11-25 (×2): qty 1

## 2016-11-25 MED ORDER — AMLODIPINE BESYLATE 5 MG PO TABS
10.0000 mg | ORAL_TABLET | Freq: Every day | ORAL | Status: DC
  Administered 2016-11-25: 10 mg via ORAL
  Filled 2016-11-25: qty 2

## 2016-11-25 MED ORDER — ATORVASTATIN CALCIUM 40 MG PO TABS
40.0000 mg | ORAL_TABLET | Freq: Every day | ORAL | Status: DC
  Administered 2016-11-25: 40 mg via ORAL
  Filled 2016-11-25: qty 1

## 2016-11-25 MED ORDER — CLOPIDOGREL BISULFATE 75 MG PO TABS
300.0000 mg | ORAL_TABLET | Freq: Once | ORAL | Status: AC
  Administered 2016-11-25: 300 mg via ORAL
  Filled 2016-11-25: qty 4

## 2016-11-25 MED ORDER — FINASTERIDE 5 MG PO TABS
5.0000 mg | ORAL_TABLET | Freq: Every day | ORAL | Status: DC
  Administered 2016-11-25 – 2016-11-27 (×3): 5 mg via ORAL
  Filled 2016-11-25 (×3): qty 1

## 2016-11-25 MED ORDER — HYDROCHLOROTHIAZIDE 25 MG PO TABS
25.0000 mg | ORAL_TABLET | Freq: Every day | ORAL | Status: DC
  Administered 2016-11-25: 25 mg via ORAL
  Filled 2016-11-25: qty 1

## 2016-11-25 NOTE — ED Notes (Signed)
Heart Healthy Diet has been ordered for Lunch. 

## 2016-11-25 NOTE — ED Notes (Signed)
Heart Healthy was ordered for patient.

## 2016-11-25 NOTE — Progress Notes (Signed)
PROGRESS NOTE  [name] [id number] DOB: Oct 15, 1941 DOA: 11/24/2016 PCP: Monico Blitz, MD   LOS: 0 days   Brief Narrative: This is a 76 y.o male who was brought to the ED by a friend last night (1/29) for slurred speech and general weakness that was concentrated to the right side. PMH significant for CAD, carotid artery occlusion, hypertension, MI in 2014, 2015 and 2016, stroke in 2010, 2014, as well as transient cerebral ischemia. The stroke team assessed the patient and gave him an NIH score of 1. CT head ordered on admission reveals advanced chronic microvascular ischemic changes of the brain and mild brain parenchymal volume loss. Patient was given aspirin therapy. Awaiting brain MRI, MRA head, and bilateral carotid ultrasound.   Assessment & Plan: Principal Problem:   Slurred speech Active Problems:   Complete heart block (HCC)   Coronary artery disease due to lipid rich plaque   CKD (chronic kidney disease) stage 2, GFR 60-89 ml/min   Essential hypertension   Hypokalemia  Acute stroke vs TIA - CT head revealed transient cerebral ischemia - MRI head pending. - MRA head pending. - Bilateral carotid ultrasound pending.   - Likely due to microvascular ischemic changes.  - on aspirin - Slurred speech has resolved. Patient states he is back to his "normal."   Hypokalemia - 1/29 >> 3.4.  - Magnesium 1/30 >> 1.9. - recheck potassium today.   Hypertension  - Controlled on Amlodipine and HCTZ.   Coronary Artery Disease - Continue lipitor.   Complete heart block with pacer placed on October 2017.   DVT prophylaxis: Lovenox Code Status: Full Family Communication: None at bedside Disposition Plan: Home after observation and stroke workup.  Consultants:   Neurology  Procedures:   2D echo: Preliminary results: no significant bilateral ICA stenosis. Antegrade vertebral flow.  Carotid ultrasound: Pending  Antimicrobials:  None.  Subjective: Patient is  very lively this morning and reports to be feeling much better than yesterday. He says his speech is back to "normal" and that he does not feel any weakness on his right side  Objective: Vitals:   11/25/16 0632 11/25/16 0755 11/25/16 0942 11/25/16 1029  BP: 119/70 126/77 124/79 130/77  Pulse: 69 69 76 79  Resp: 16 18 16 22   Temp:      TempSrc:      SpO2: 95% 95% 98% 96%    Intake/Output Summary (Last 24 hours) at 11/25/16 1153 Last data filed at 11/24/16 2339  Gross per 24 hour  Intake                0 ml  Output              750 ml  Net             -750 ml   There were no vitals filed for this visit.  Examination: Constitutional: NAD Vitals:   11/25/16 0632 11/25/16 0755 11/25/16 0942 11/25/16 1029  BP: 119/70 126/77 124/79 130/77  Pulse: 69 69 76 79  Resp: 16 18 16 22   Temp:      TempSrc:      SpO2: 95% 95% 98% 96%   Eyes: PERRL. ENMT: Mucous membranes are moist.  Respiratory: clear to auscultation bilaterally, no wheezing, no crackles. Normal respiratory effort. No accessory muscle use.  Cardiovascular: Regular rate and rhythm, Pacemaker in place. No LE edema. 2+ pedal pulses.  Abdomen: no tenderness.  Musculoskeletal:  No joint deformity upper and lower extremities. No contractures. Normal  muscle tone.  Neurologic: CN 2-12 grossly intact. Strength 5/5 in all 4. No slurred speech, equal grip strength bilaterally. Psychiatric: Normal judgment and insight. Alert and oriented x 3. Normal mood.    Data Reviewed: I have personally reviewed following labs and imaging studies  CBC:  Recent Labs Lab 11/24/16 1832 11/24/16 1852  WBC 16.1*  --   NEUTROABS 10.8*  --   HGB 15.3 15.3  HCT 44.0 45.0  MCV 89.4  --   PLT 242  --    Basic Metabolic Panel:  Recent Labs Lab 11/24/16 1832 11/24/16 1852 11/25/16 0821  NA 136 138  --   K 3.4* 3.4*  --   CL 98* 98*  --   CO2 24  --   --   GLUCOSE 121* 124*  --   BUN 18 23*  --   CREATININE 1.18 1.30*  --   CALCIUM  9.7  --   --   MG  --   --  1.9   GFR: CrCl cannot be calculated (Unknown ideal weight.). Liver Function Tests:  Recent Labs Lab 11/24/16 1832  AST 27  ALT 25  ALKPHOS 82  BILITOT 1.0  PROT 7.6  ALBUMIN 4.2   No results for input(s): LIPASE, AMYLASE in the last 168 hours. No results for input(s): AMMONIA in the last 168 hours. Coagulation Profile:  Recent Labs Lab 11/24/16 1832  INR 1.03   Cardiac Enzymes: No results for input(s): CKTOTAL, CKMB, CKMBINDEX, TROPONINI in the last 168 hours. BNP (last 3 results) No results for input(s): PROBNP in the last 8760 hours. HbA1C: No results for input(s): HGBA1C in the last 72 hours. CBG:  Recent Labs Lab 11/24/16 1829  GLUCAP 123*   Lipid Profile:  Recent Labs  11/25/16 0821  CHOL 112  HDL 38*  LDLCALC 47  TRIG 136  CHOLHDL 2.9   Thyroid Function Tests: No results for input(s): TSH, T4TOTAL, FREET4, T3FREE, THYROIDAB in the last 72 hours. Anemia Panel: No results for input(s): VITAMINB12, FOLATE, FERRITIN, TIBC, IRON, RETICCTPCT in the last 72 hours. Urine analysis:    Component Value Date/Time   COLORURINE STRAW (A) 11/24/2016 1858   APPEARANCEUR CLEAR 11/24/2016 1858   LABSPEC 1.004 (L) 11/24/2016 1858   PHURINE 7.0 11/24/2016 1858   GLUCOSEU NEGATIVE 11/24/2016 1858   HGBUR NEGATIVE 11/24/2016 1858   BILIRUBINUR NEGATIVE 11/24/2016 [place] 11/24/2016 1858   PROTEINUR NEGATIVE 11/24/2016 1858   UROBILINOGEN 0.2 06/03/2015 1303   NITRITE NEGATIVE 11/24/2016 1858   LEUKOCYTESUR SMALL (A) 11/24/2016 1858   Sepsis Labs: Invalid input(s): PROCALCITONIN, LACTICIDVEN  No results found for this or any previous visit (from the past 240 hour(s)).    Radiology Studies: Ct Head Code Stroke W/o Cm  Result Date: 11/24/2016 CLINICAL DATA:  Code stroke. Slurred speech and right-sided weakness. EXAM: CT HEAD WITHOUT CONTRAST TECHNIQUE: Contiguous axial images were obtained from the base of the  skull through the vertex without intravenous contrast. COMPARISON:  06/01/2015 CT head. FINDINGS: Brain: No evidence of acute infarction, hemorrhage, hydrocephalus, extra-axial collection or mass lesion/mass effect. Confluent hypoattenuation of white matter in the left-greater-than-right hemispheres is nonspecific but consistent with advanced chronic microvascular ischemic changes and mild brain parenchymal volume loss. There is a chronic lacunar infarct within the left lentiform nucleus. Vascular: Moderate calcific atherosclerosis of cavernous and paraclinoid internal carotid arteries. Skull: Normal. Negative for fracture or focal lesion. Sinuses/Orbits: No acute finding. Other: None. ASPECTS [facility] Stroke Program Early CT Score) -  Ganglionic level infarction (caudate, lentiform nuclei, internal capsule, insula, M1-M3 cortex): 7 - Supraganglionic infarction (M4-M6 cortex): 3 Total score (0-10 with 10 being normal): 10 IMPRESSION: 1. No acute intracranial abnormality identified. If symptoms persist or if clinically indicated MRI is more sensitive for acute stroke. 2. Advanced chronic microvascular ischemic changes of the brain and mild brain parenchymal volume loss. 3. ASPECTS is 10 These results were called by telephone at the time of interpretation on 11/24/2016 at 6:56 pm to Dr. Flossie Dibble , who verbally acknowledged these results. Electronically Signed   By: Kristine Garbe [name]   On: 11/24/2016 18:58     Scheduled Meds: .  stroke: mapping our early stages of recovery book   Does not apply Once  . amLODipine  10 mg Oral Daily  . aspirin  300 mg Rectal Daily   Or  . aspirin  325 mg Oral Daily  . atorvastatin  40 mg Oral Daily  . enoxaparin (LOVENOX) injection  40 mg Subcutaneous QHS  . finasteride  5 mg Oral Daily  . hydrochlorothiazide  25 mg Oral Daily    Marcie Bal, PA-S [place]   Attending MD note  Patient was seen, examined,treatment plan was  discussed with the PA-S Marcie Bal.  I have personally reviewed the clinical findings, lab, imaging studies and management of this patient in detail. I agree with the documentation, as recorded by the PA-.   Patient is a pleasant 76 y.o. male with a past medical history significant for CAD, strokes x2, CHB s/p pacer placement last Oct, PVD s/p bilat CEA who presents with slurred speech  CVA / TIA - MRI /MRA pending. Neurology to see - complete workup with 2D echo, carotids - on aspirin - symptoms resolved  CKD III  - Cr stable, monitor  Hypertension  - allow permissive HTN, stop HCTZ  Coronary Artery Disease - Continue lipitor. No chest pain  Complete heart block with pacer placed on October 2017.   On Exam: BP 126/64 (BP Location: Right Arm)   Pulse 79   Temp 97.8 F (36.6 C) (Oral)   Resp 21   SpO2 96%  Gen. exam: Awake, alert, not in any distress Chest: Good air entry bilaterally, no rhonchi or rales CVS: S1-S2 regular, no murmurs Abdomen: Soft, nontender and nondistended Neurology: Non-focal Skin: No rash or lesions  Rest as above   If 7PM-7AM, please contact night-coverage [email or web address] Password [email or web address] 11/25/2016, 11:53 AM

## 2016-11-25 NOTE — Progress Notes (Signed)
STROKE TEAM PROGRESS NOTE   HISTORY OF PRESENT ILLNESS (per record) [name] is a 76 y.o. male who was in his normal state of health around 3 PM when he said hello to somebody (LKW 3 PM on 11/24/2016). Subsequently when his family arrived home around 4 he noticed that his speech was slurred and initially worsened, but has subsequently improved some. He also felt like he had some trouble with his right arm earlier, but this appears to be mostly resolved. He continues, however, to have persistent dysarthria. Patient was not administered IV t-PA secondary to . Mild symptoms. He was admitted for further evaluation and treatment.   SUBJECTIVE (INTERVAL HISTORY) No family at bedside. Pt came back from MRI. He stated that his symptoms seem all resolved. MRI negative. He passed swallow and on diet. 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    OBJECTIVE Temp:  [97.8 F (36.6 C)-98.3 F (36.8 C)] 98.3 F (36.8 C) (01/30 2131) Pulse Rate:  [69-91] 91 (01/30 2131) Cardiac Rhythm: Ventricular paced (01/30 1903) Resp:  [16-22] 16 (01/30 2131) BP: (114-143)/(63-83) 123/83 (01/30 2131) SpO2:  [94 %-100 %] 97 % (01/30 2131)  CBC:   Recent Labs Lab 11/24/16 1832 11/24/16 1852  WBC 16.1*  --   NEUTROABS 10.8*  --   HGB 15.3 15.3  HCT 44.0 45.0  MCV 89.4  --   PLT 242  --     Basic Metabolic Panel:   Recent Labs Lab 11/24/16 1832 11/24/16 1852 11/25/16 0821  NA 136 138  --   K 3.4* 3.4*  --   CL 98* 98*  --   CO2 24  --   --   GLUCOSE 121* 124*  --   BUN 18 23*  --   CREATININE 1.18 1.30*  --   CALCIUM 9.7  --   --   MG  --   --  1.9    Lipid Panel:     Component Value Date/Time   CHOL 112 11/25/2016 0821   TRIG 136 11/25/2016 0821   HDL 38 (L) 11/25/2016 0821   CHOLHDL 2.9 11/25/2016 0821   VLDL 27 11/25/2016 0821   LDLCALC 47 11/25/2016 0821   HgbA1c:  Lab Results  Component Value Date   HGBA1C 6.4 (H) 06/02/2015   Urine Drug Screen: No results found  for: LABOPIA, COCAINSCRNUR, LABBENZ, AMPHETMU, THCU, LABBARB    IMAGING I have personally reviewed the radiological images below and agree with the radiology interpretations.  Ct Head Code Stroke W/o Cm 11/24/2016 1. No acute intracranial abnormality identified. If symptoms persist or if clinically indicated MRI is more sensitive for acute stroke. 2. Advanced chronic microvascular ischemic changes of the brain and mild brain parenchymal volume loss. 3. ASPECTS is 10   Carotid Doppler   There is 1-39% bilateral ICA stenosis. Vertebral artery flow is antegrade.    Mri and Mra Brain Wo Contrast 11/25/2016 IMPRESSION: 1. No acute ischemia. 2. Loss of normal flow related enhancement within the proximal left M2 segment superior division with normal enhancement distally, consistent with severe stenosis or nonocclusive thrombus at this location. 3. Severe chronic microvascular ischemia.  TTE - pending   PHYSICAL EXAM  Temp:  [97.8 F (36.6 C)-98.3 F (36.8 C)] 98.3 F (36.8 C) (01/30 2131) Pulse Rate:  [69-91] 91 (01/30 2131) Resp:  [16-22] 16 (01/30 2131) BP: (114-143)/(63-83) 123/83 (01/30 2131) SpO2:  [94 %-100 %] 97 % (01/30 2131)  General - Well nourished, well  developed, in no apparent distress.  Ophthalmologic - Sharp disc margins OU.   Cardiovascular - Regular rate and rhythm.  Mental Status -  Level of arousal and orientation to time, place, and person were intact. Language including expression, naming, repetition, comprehension was assessed and found intact. Fund of Knowledge was assessed and was intact.  Cranial Nerves II - XII - II - Visual field intact OU. III, IV, VI - Extraocular movements intact. V - Facial sensation intact bilaterally. VII - Facial movement intact bilaterally. VIII - Hearing & vestibular intact bilaterally. X - Palate elevates symmetrically. XI - Chin turning & shoulder shrug intact bilaterally. XII - Tongue protrusion intact.  Motor  Strength - The patient's strength was normal in all extremities and pronator drift was absent.  Bulk was normal and fasciculations were absent.   Motor Tone - Muscle tone was assessed at the neck and appendages and was normal.  Reflexes - The patient's reflexes were 1+ in all extremities and he had no pathological reflexes.  Sensory - Light touch, temperature/pinprick were assessed and were symmetrical.    Coordination - The patient had normal movements in the hands and feet with no ataxia or dysmetria.  Tremor was absent.  Gait and Station - deferred.   ASSESSMENT/PLAN Mr. [name] is a 76 y.o. male with history of CAD, strokes x 2, CHB s/p pacer placement in Oct, PVD s/p bilat CEA presenting with slurred speech. He did not receive IV t-PA due to Mild symptoms.   TIA:  L brain TIA likely related to left M2 high grade stenosis  Resultant  Deficit resolved  Code stroke CT negative. Aspects 10  MRI  / MRA  No acute stroke but left M2 high grade stensosi  Carotid Doppler  No significant stenosis   2D Echo  pending   LDL 47  HgbA1c pending  Lovenox 40 mg sq daily for VTE prophylaxis Diet Heart Room service appropriate? Yes; Fluid consistency: Thin  aspirin 81 mg daily prior to admission, now on aspirin 325 mg daily. Due to intracranial stenosis with fluctuating symptoms, recommend ASA and plavix for dual antiplatelet for 3 months than then plavix alone. plavix loading dose given  Patient counseled to be compliant with his antithrombotic medications  Ongoing aggressive stroke risk factor management  Therapy recommendations:  pending   Disposition:  pending   B/l carotid stenosis s/p b/l CEA in the past  CUS this time unremarkable  Following with [facility]  Hypertension  Stable  Permissive hypertension (OK if < 220/120) but gradually normalize in 5-7 days  Long-term BP goal 130-150 due to left M2 high grade stenosis  Hyperlipidemia  Home meds:  Lipitor 40,  resumed in hospital  LDL 47, goal < 70  Continue statin at discharge  Other Stroke Risk Factors  Advanced age  Obesity  Hx stroke/TIA  2014 - Unable to find documentation of stroke in 2014. Patient did have carotid endarterectomies 2 this year. No associated strokes.  05/2009 . Left brain subcortical infarct, status post two thirds dose IV TPA, infarct secondary to small vessel disease  Coronary artery disease s/p MI x 3  Other Active Problems  Hypokalemia  Complete heart block with pacemaker placement October 2017  Hospital day # 0  Rosalin Hawking, MD PhD Stroke Neurology 11/25/2016 10:56 PM    To contact Stroke Continuity provider, please refer to [email or web address] After hours, contact General Neurology

## 2016-11-25 NOTE — ED Notes (Signed)
Transported to MRI

## 2016-11-25 NOTE — Progress Notes (Signed)
Pt. With orders for Plavix 300mg . Pt. States "I almost bled to death the last time I took that." Dr. Leonel Ramsay called and notified of patient's history. He stated to give the med since pt. currently does not have any signs of active bleeding.

## 2016-11-25 NOTE — Progress Notes (Signed)
*  PRELIMINARY RESULTS* Vascular Ultrasound Carotid Duplex (Doppler) has been completed.  Preliminary findings: Bilateral: No significant (1-39%) ICA stenosis. Antegrade vertebral flow.  Bilateral CEA site patent.    Landry Mellow, RDMS, RVT  11/25/2016, 11:48 AM

## 2016-11-25 NOTE — Progress Notes (Signed)
Report received from [facility] in the ED. Patient currently in MRI. Will be transported to 5M18 by ED staff once MRI completed.

## 2016-11-25 NOTE — ED Notes (Signed)
Admitting at bedside 

## 2016-11-25 NOTE — Care Management Obs Status (Signed)
[place] NOTIFICATION   Patient Details  Name: [name] MRN: [id number] Date of Birth: 06-24-1941   Medicare Observation Status Notification Given:  Ernesta Amble, RN 11/25/2016, 11:23 PM

## 2016-11-25 NOTE — Progress Notes (Signed)
Pt. arrived from ED. Oriented to the unit. Bed Alarm set. Admission RN called to assist with admission.

## 2016-11-25 NOTE — ED Notes (Signed)
Transported to [facility] vascular

## 2016-11-26 ENCOUNTER — Observation Stay (HOSPITAL_BASED_OUTPATIENT_CLINIC_OR_DEPARTMENT_OTHER): Payer: Medicare Other

## 2016-11-26 DIAGNOSIS — I639 Cerebral infarction, unspecified: Secondary | ICD-10-CM

## 2016-11-26 DIAGNOSIS — I6602 Occlusion and stenosis of left middle cerebral artery: Secondary | ICD-10-CM | POA: Diagnosis not present

## 2016-11-26 DIAGNOSIS — I2510 Atherosclerotic heart disease of native coronary artery without angina pectoris: Secondary | ICD-10-CM | POA: Diagnosis not present

## 2016-11-26 DIAGNOSIS — G459 Transient cerebral ischemic attack, unspecified: Secondary | ICD-10-CM | POA: Diagnosis not present

## 2016-11-26 DIAGNOSIS — I10 Essential (primary) hypertension: Secondary | ICD-10-CM | POA: Diagnosis not present

## 2016-11-26 DIAGNOSIS — I2583 Coronary atherosclerosis due to lipid rich plaque: Secondary | ICD-10-CM | POA: Diagnosis not present

## 2016-11-26 DIAGNOSIS — R4781 Slurred speech: Secondary | ICD-10-CM | POA: Diagnosis not present

## 2016-11-26 DIAGNOSIS — I442 Atrioventricular block, complete: Secondary | ICD-10-CM | POA: Diagnosis not present

## 2016-11-26 LAB — BASIC METABOLIC PANEL
Anion gap: 9 (ref 5–15)
BUN: 19 mg/dL (ref 6–20)
CALCIUM: 9 mg/dL (ref 8.9–10.3)
CO2: 28 mmol/L (ref 22–32)
CREATININE: 1.38 mg/dL — AB (ref 0.61–1.24)
Chloride: 98 mmol/L — ABNORMAL LOW (ref 101–111)
GFR, EST AFRICAN AMERICAN: 55 mL/min — AB (ref >60)
GFR, EST NON AFRICAN AMERICAN: 47 mL/min — AB (ref >60)
Glucose, Bld: 151 mg/dL — ABNORMAL HIGH (ref 65–99)
Potassium: 3.7 mmol/L (ref 3.5–5.1)
SODIUM: 135 mmol/L (ref 135–145)

## 2016-11-26 LAB — HEMOGLOBIN A1C
HEMOGLOBIN A1C: 7.1 % — AB (ref 4.8–5.6)
MEAN PLASMA GLUCOSE: 157 mg/dL

## 2016-11-26 LAB — ECHOCARDIOGRAM COMPLETE
HEIGHTINCHES: 69 in
Weight: 3424 oz

## 2016-11-26 LAB — CBC
HEMATOCRIT: 43.8 % (ref 39.0–52.0)
Hemoglobin: 14.8 g/dL (ref 13.0–17.0)
MCH: 30.6 pg (ref 26.0–34.0)
MCHC: 33.8 g/dL (ref 30.0–36.0)
MCV: 90.5 fL (ref 78.0–100.0)
Platelets: 241 10*3/uL (ref 150–400)
RBC: 4.84 MIL/uL (ref 4.22–5.81)
RDW: 12.9 % (ref 11.5–15.5)
WBC: 14.1 10*3/uL — AB (ref 4.0–10.5)

## 2016-11-26 NOTE — Care Management Note (Signed)
Case Management Note  Patient Details  Name: [name] MRN: [id number] Date of Birth: April 01, 1941  Subjective/Objective:                Patient presented with slurred speech. Lives at home with non-relatives/friends.  CM will follow for discharge needs pending PT/OT evals and physician orders.     Action/Plan:   Expected Discharge Date:                  Expected Discharge Plan:     In-House Referral:     Discharge planning Services     Post Acute Care Choice:    Choice offered to:     DME Arranged:    DME Agency:     HH Arranged:    HH Agency:     Status of Service:     If discussed at [employer] of Stay Meetings, dates discussed:    Additional Comments:  Rolm Baptise, RN 11/26/2016, 10:48 AM

## 2016-11-26 NOTE — Evaluation (Signed)
Occupational Therapy Evaluation Patient Details Name: [name] MRN: [id number] DOB: 12/24/1940 Today's Date: 11/26/2016    History of Present Illness [name] is a 76 y.o. male who presented to the ED with slurred speech and R sided weakness. He has a PMH significant for CAD, carotid artery occlusion, hypertension, multiple MI, CVA, and transient cerebral ischemia. MRI negative for acute abnormality but did show L MCA M2 high grade stenosis.   Clinical Impression   PTA, pt was independent with ADL and functional mobility. He does not drive due to problems with vision (blurry vision) at baseline. Pt currently demonstrating ability to complete all ADL and IADL independently during OT evaluation. Pt did demonstrate slight difficulty with delayed recall tasks and discussed methods for medication management. Pt able to problem solve strategies for safe management and demonstrates understanding. Recommended that pt follow-up with opthamologist for blurring of vision. No further acute OT needs identified and OT will sign off.    Follow Up Recommendations  No OT follow up;Supervision - Intermittent    Equipment Recommendations  None recommended by OT    Recommendations for Other Services       Precautions / Restrictions Restrictions Weight Bearing Restrictions: No      Mobility Bed Mobility               General bed mobility comments: Sitting at EOB on OT arrival  Transfers Overall transfer level: Independent                    Balance Overall balance assessment: No apparent balance deficits (not formally assessed)                                          ADL Overall ADL's : Independent                                       General ADL Comments: Able to complete all ADL at independent level.     Vision Vision Assessment?: No apparent visual deficits Additional Comments: Pt able to read print and use central and  peripheral vision functionally.   Perception     Praxis      Pertinent Vitals/Pain Pain Assessment: No/denies pain     Hand Dominance Right   Extremity/Trunk Assessment Upper Extremity Assessment Upper Extremity Assessment: Overall WFL for tasks assessed   Lower Extremity Assessment Lower Extremity Assessment: Overall WFL for tasks assessed       Communication Communication Communication: No difficulties   Cognition Arousal/Alertness: Awake/alert Behavior During Therapy: WFL for tasks assessed/performed Overall Cognitive Status: Within Functional Limits for tasks assessed                 General Comments: Pt appropriately emotional when he brought up death of daughter. Slightly decreased short term memory with delayed recall but [facility]. Good demonstration of medication management safety.   General Comments       Exercises       Shoulder Instructions      Home Living Family/patient expects to be discharged to:: Private residence Living Arrangements: Non-relatives/Friends Available Help at Discharge: Friend(s);Available 24 hours/day Type of Home: House Home Access: Level entry     Home Layout: One level     Bathroom Shower/Tub: Tub/shower unit;Walk-in shower Shower/tub characteristics: Theatre stage manager  Toilet: Standard     Home Equipment: Cane - single point;Hand held shower head;Shower seat          Prior Functioning/Environment Level of Independence: Independent        Comments: Does not drive anymore due to vision problems.         OT Problem List:     OT Treatment/Interventions:      OT Goals(Current goals can be found in the care plan section) Acute Rehab OT Goals Patient Stated Goal: to go home  OT Goal Formulation: With patient Time For Goal Achievement: 12/03/16 Potential to Achieve Goals: Good  OT Frequency:     Barriers to D/C:            Co-evaluation              End of Session Nurse Communication: Mobility  status  Activity Tolerance: Patient tolerated treatment well Patient left: in chair;with call bell/phone within reach   Time: 0837-0909 OT Time Calculation (min): 32 min Charges:  OT General Charges $OT Visit: 1 Procedure OT Evaluation $OT Eval Moderate Complexity: 1 Procedure OT Treatments $Self Care/Home Management : 8-22 mins G-Codes: OT G-codes **NOT FOR INPATIENT CLASS** Functional Assessment Tool Used: clinical judgement Functional Limitation: Self care Self Care Current Status [id number]): 0 percent impaired, limited or restricted Self Care Goal Status [id number]): 0 percent impaired, limited or restricted Self Care Discharge Status [id number]): 0 percent impaired, limited or restricted  [facility], OTR/L [phone] 11/26/2016, 9:57 AM

## 2016-11-26 NOTE — Progress Notes (Signed)
PROGRESS NOTE    [name]  [id number] DOB: Sep 25, 1941 DOA: 11/24/2016 PCP: Monico Blitz, MD    Brief Narrative:  76 y.o male who was brought to the ED by a friend last night (1/29) for slurred speech and general weakness that was concentrated to the right side. PMH significant for CAD, carotid artery occlusion, hypertension, MI in 2014, 2015 and 2016, stroke in 2010, 2014, as well as transient cerebral ischemia. The stroke team assessed the patient and gave him an NIH score of 1. CT head ordered on admission reveals advanced chronic microvascular ischemic changes of the brain and mild brain parenchymal volume loss. Patient was given aspirin therapy. Awaiting brain MRI, MRA head, and bilateral carotid ultrasound.   Assessment & Plan:   Principal Problem:   Slurred speech Active Problems:   Complete heart block (HCC)   Coronary artery disease due to lipid rich plaque   CKD (chronic kidney disease) stage 2, GFR 60-89 ml/min   Essential hypertension   Hypokalemia   Middle cerebral artery stenosis, left  Acute stroke vs TIA - CT head revealed transient cerebral ischemia - MRI, MRA head with findings consistent with severe stenosis or nonocclusive thrombus within prox M2 segment superior division. - Bilateral carotid ultrasound done, pending results.   - continued on aspirin - symptoms have since resolved. Stable at present  Hypokalemia - 1/29 >> 3.4.  - Magnesium 1/30 >> 1.9. - repeat electrolytes and correct as needed  Hypertension  - Stable on Amlodipine and HCTZ.   Coronary Artery Disease - Continue lipitor.  - Stable at present  Complete heart block with pacer placed on October 2017. -Stable at present  DVT prophylaxis: Lovenox subQ Code Status: Full Family Communication: Pt in room, family not at bedside Disposition Plan: Possible d/c home 2/1  Consultants:   Neurology  Procedures:     Antimicrobials: Anti-infectives    None        Subjective: No complaints.  Objective: Vitals:   11/26/16 0150 11/26/16 0510 11/26/16 1033 11/26/16 1354  BP: 109/83 119/72 121/69 111/63  Pulse: 93 82 80 83  Resp: 18 16 17 18   Temp: 98.6 F (37 C) 98 F (36.7 C) 98.3 F (36.8 C)   TempSrc: Oral Oral Oral   SpO2: 96% 95% 98% 98%  Weight:      Height:        Intake/Output Summary (Last 24 hours) at 11/26/16 1716 Last data filed at 11/26/16 1320  Gross per 24 hour  Intake              720 ml  Output                0 ml  Net              720 ml   Filed Weights   11/25/16 2300  Weight: 97.1 kg (214 lb)    Examination:  General exam: Appears calm and comfortable  Respiratory system: Clear to auscultation. Respiratory effort normal. Cardiovascular system: S1 & S2 heard, RRR Gastrointestinal system: Abdomen is nondistended, soft and nontender. No organomegaly or masses felt. Normal bowel sounds heard. Central nervous system: Alert and oriented. No focal neurological deficits. Extremities: Symmetric 5 x 5 power. Skin: No rashes, lesions Psychiatry: Judgement and insight appear normal. Mood & affect appropriate.   Data Reviewed: I have personally reviewed following labs and imaging studies  CBC:  Recent Labs Lab 11/24/16 1832 11/24/16 1852 11/26/16 0625  WBC 16.1*  --  14.1*  NEUTROABS 10.8*  --   --   HGB 15.3 15.3 14.8  HCT 44.0 45.0 43.8  MCV 89.4  --  90.5  PLT 242  --  [id number]   Basic Metabolic Panel:  Recent Labs Lab 11/24/16 1832 11/24/16 1852 11/25/16 0821 11/26/16 0625  NA 136 138  --  135  K 3.4* 3.4*  --  3.7  CL 98* 98*  --  98*  CO2 24  --   --  28  GLUCOSE 121* 124*  --  151*  BUN 18 23*  --  19  CREATININE 1.18 1.30*  --  1.38*  CALCIUM 9.7  --   --  9.0  MG  --   --  1.9  --    GFR: Estimated Creatinine Clearance: 50.7 mL/min (by C-G formula based on SCr of 1.38 mg/dL (H)). Liver Function Tests:  Recent Labs Lab 11/24/16 1832  AST 27  ALT 25  ALKPHOS 82  BILITOT 1.0   PROT 7.6  ALBUMIN 4.2   No results for input(s): LIPASE, AMYLASE in the last 168 hours. No results for input(s): AMMONIA in the last 168 hours. Coagulation Profile:  Recent Labs Lab 11/24/16 1832  INR 1.03   Cardiac Enzymes: No results for input(s): CKTOTAL, CKMB, CKMBINDEX, TROPONINI in the last 168 hours. BNP (last 3 results) No results for input(s): PROBNP in the last 8760 hours. HbA1C:  Recent Labs  11/25/16 0821  HGBA1C 7.1*   CBG:  Recent Labs Lab 11/24/16 1829  GLUCAP 123*   Lipid Profile:  Recent Labs  11/25/16 0821  CHOL 112  HDL 38*  LDLCALC 47  TRIG 136  CHOLHDL 2.9   Thyroid Function Tests: No results for input(s): TSH, T4TOTAL, FREET4, T3FREE, THYROIDAB in the last 72 hours. Anemia Panel: No results for input(s): VITAMINB12, FOLATE, FERRITIN, TIBC, IRON, RETICCTPCT in the last 72 hours. Sepsis Labs: No results for input(s): PROCALCITON, LATICACIDVEN in the last 168 hours.  No results found for this or any previous visit (from the past 240 hour(s)).   Radiology Studies: Mr Brain 61 Contrast  Result Date: 11/25/2016 CLINICAL DATA:  Slurred speech EXAM: MRI HEAD WITHOUT CONTRAST MRA HEAD WITHOUT CONTRAST TECHNIQUE: Multiplanar, multiecho pulse sequences of the brain and surrounding structures were obtained without intravenous contrast. Angiographic images of the head were obtained using MRA technique without contrast. COMPARISON:  Head CT 11/24/2016 FINDINGS: MRI HEAD FINDINGS Brain: No focal diffusion restriction to indicate acute infarct. No intraparenchymal hemorrhage. There is diffuse confluent hyperintense T2-weighted signal within the periventricular and deep white matter, most often seen in the setting of chronic microvascular ischemia. There are multiple old lacunar infarcts. No mass lesion or midline shift. No hydrocephalus or extra-axial fluid collection. The midline structures are normal. No age advanced or lobar predominant atrophy.  Vascular: Major intracranial arterial and venous sinus flow voids are preserved. No evidence of chronic microhemorrhage or amyloid angiopathy. Skull and upper cervical spine: The visualized skull base, calvarium, upper cervical spine and extracranial soft tissues are normal. Sinuses/Orbits: No fluid levels or advanced mucosal thickening. No mastoid effusion. Normal orbits. MRA HEAD FINDINGS Intracranial internal carotid arteries: Normal. Anterior cerebral arteries: Normal. Middle cerebral arteries: There is a focal defect of flow related enhancement within the proximal left M2 segments superior division. There is normal enhancement of the distal left MCA distribution. The right MCA is normal. Posterior communicating arteries: Present bilaterally. Posterior cerebral arteries: Bilateral fetal origins. Otherwise normal. Basilar artery: Normal. Vertebral arteries: Codominant. Normal. Superior  cerebellar arteries: Normal. Anterior inferior cerebellar arteries: Normal. Posterior inferior cerebellar arteries: Normal. IMPRESSION: 1. No acute ischemia. 2. Loss of normal flow related enhancement within the proximal left M2 segment superior division with normal enhancement distally, consistent with severe stenosis or nonocclusive thrombus at this location. 3. Severe chronic microvascular ischemia. The findings were discussed with the patient's physician at 4:53 p.m. Electronically Signed   By: Ulyses Jarred [name]   On: 11/25/2016 16:58   Mr Jodene Nam Head/brain [id number] Cm  Result Date: 11/25/2016 CLINICAL DATA:  Slurred speech EXAM: MRI HEAD WITHOUT CONTRAST MRA HEAD WITHOUT CONTRAST TECHNIQUE: Multiplanar, multiecho pulse sequences of the brain and surrounding structures were obtained without intravenous contrast. Angiographic images of the head were obtained using MRA technique without contrast. COMPARISON:  Head CT 11/24/2016 FINDINGS: MRI HEAD FINDINGS Brain: No focal diffusion restriction to indicate acute infarct. No  intraparenchymal hemorrhage. There is diffuse confluent hyperintense T2-weighted signal within the periventricular and deep white matter, most often seen in the setting of chronic microvascular ischemia. There are multiple old lacunar infarcts. No mass lesion or midline shift. No hydrocephalus or extra-axial fluid collection. The midline structures are normal. No age advanced or lobar predominant atrophy. Vascular: Major intracranial arterial and venous sinus flow voids are preserved. No evidence of chronic microhemorrhage or amyloid angiopathy. Skull and upper cervical spine: The visualized skull base, calvarium, upper cervical spine and extracranial soft tissues are normal. Sinuses/Orbits: No fluid levels or advanced mucosal thickening. No mastoid effusion. Normal orbits. MRA HEAD FINDINGS Intracranial internal carotid arteries: Normal. Anterior cerebral arteries: Normal. Middle cerebral arteries: There is a focal defect of flow related enhancement within the proximal left M2 segments superior division. There is normal enhancement of the distal left MCA distribution. The right MCA is normal. Posterior communicating arteries: Present bilaterally. Posterior cerebral arteries: Bilateral fetal origins. Otherwise normal. Basilar artery: Normal. Vertebral arteries: Codominant. Normal. Superior cerebellar arteries: Normal. Anterior inferior cerebellar arteries: Normal. Posterior inferior cerebellar arteries: Normal. IMPRESSION: 1. No acute ischemia. 2. Loss of normal flow related enhancement within the proximal left M2 segment superior division with normal enhancement distally, consistent with severe stenosis or nonocclusive thrombus at this location. 3. Severe chronic microvascular ischemia. The findings were discussed with the patient's physician at 4:53 p.m. Electronically Signed   By: Ulyses Jarred [name]   On: 11/25/2016 16:58   Ct Head Code Stroke W/o Cm  Result Date: 11/24/2016 CLINICAL DATA:  Code stroke.  Slurred speech and right-sided weakness. EXAM: CT HEAD WITHOUT CONTRAST TECHNIQUE: Contiguous axial images were obtained from the base of the skull through the vertex without intravenous contrast. COMPARISON:  06/01/2015 CT head. FINDINGS: Brain: No evidence of acute infarction, hemorrhage, hydrocephalus, extra-axial collection or mass lesion/mass effect. Confluent hypoattenuation of white matter in the left-greater-than-right hemispheres is nonspecific but consistent with advanced chronic microvascular ischemic changes and mild brain parenchymal volume loss. There is a chronic lacunar infarct within the left lentiform nucleus. Vascular: Moderate calcific atherosclerosis of cavernous and paraclinoid internal carotid arteries. Skull: Normal. Negative for fracture or focal lesion. Sinuses/Orbits: No acute finding. Other: None. ASPECTS [facility] Stroke Program Early CT Score) - Ganglionic level infarction (caudate, lentiform nuclei, internal capsule, insula, M1-M3 cortex): 7 - Supraganglionic infarction (M4-M6 cortex): 3 Total score (0-10 with 10 being normal): 10 IMPRESSION: 1. No acute intracranial abnormality identified. If symptoms persist or if clinically indicated MRI is more sensitive for acute stroke. 2. Advanced chronic microvascular ischemic changes of the brain and mild brain parenchymal volume loss. 3.  ASPECTS is 10 These results were called by telephone at the time of interpretation on 11/24/2016 at 6:56 pm to Dr. Flossie Dibble , who verbally acknowledged these results. Electronically Signed   By: Kristine Garbe [name]   On: 11/24/2016 18:58    Scheduled Meds: . aspirin  325 mg Oral Daily  . atorvastatin  80 mg Oral Daily  . clopidogrel  75 mg Oral Daily  . enoxaparin (LOVENOX) injection  40 mg Subcutaneous QHS  . finasteride  5 mg Oral Daily   Continuous Infusions:   LOS: 0 days   CHIU, Orpah Melter, MD [facility] Pager [phone]  If 7PM-7AM, please contact  night-coverage [email or web address] Password [facility] 11/26/2016, 5:16 PM

## 2016-11-26 NOTE — Progress Notes (Signed)
  Echocardiogram 2D Echocardiogram has been performed. There was no IV access available to administer Definity.  Dorlisa Savino 11/26/2016, 11:28 AM

## 2016-11-26 NOTE — Evaluation (Signed)
Physical Therapy Evaluation and Discharge Patient Details Name: [name] MRN: [id number] DOB: [birth year] Today's Date: 11/26/2016   History of Present Illness  [name] is a delightful 76 y.o. male who presented to the ED with slurred speech and R sided weakness. He has a PMH significant for CAD, carotid artery occlusion, hypertension, multiple MI, CVA, and transient cerebral ischemia. MRI negative for acute abnormality but did show L MCA M2 high grade stenosis.  Clinical Impression  Pt presented sitting OOB in recliner when PT entered room. Prior to admission, pt reported that he was independent with all functional mobility and ADLs. Pt currently mod I to Independent with all functional mobility. No further acute PT needs identified at this time. PT signing off.     Follow Up Recommendations No PT follow up    Equipment Recommendations  None recommended by PT    Recommendations for Other Services       Precautions / Restrictions Restrictions Weight Bearing Restrictions: No      Mobility  Bed Mobility               General bed mobility comments: pt sitting OOB in recliner when PT entered  Transfers Overall transfer level: Independent Equipment used: None                Ambulation/Gait Ambulation/Gait assistance: Independent Ambulation Distance (Feet): 300 Feet Assistive device: None Gait Pattern/deviations: Step-through pattern Gait velocity: WFL Gait velocity interpretation: at or above normal speed for age/gender General Gait Details: no instability or LOB   Stairs Stairs: Yes Stairs assistance: Modified independent (Device/Increase time) Stair Management: Two rails;Alternating pattern;Forwards Number of Stairs: 2 (2 stairs x2) General stair comments: no instability or LOB noted, use of bilateral handrails  Wheelchair Mobility    Modified Rankin (Stroke Patients Only) Modified Rankin (Stroke Patients Only) Pre-Morbid Rankin Score: No  significant disability Modified Rankin: No significant disability     Balance                                 Standardized Balance Assessment Standardized Balance Assessment : Dynamic Gait Index   Dynamic Gait Index Level Surface: Normal Change in Gait Speed: Normal Gait with Horizontal Head Turns: Normal Gait with Vertical Head Turns: Normal Gait and Pivot Turn: Normal Step Over Obstacle: Mild Impairment Step Around Obstacles: Normal Steps: Mild Impairment Total Score: 22       Pertinent Vitals/Pain Pain Assessment: No/denies pain    Home Living Family/patient expects to be discharged to:: Private residence Living Arrangements: Non-relatives/Friends Available Help at Discharge: Friend(s);Available 24 hours/day Type of Home: House Home Access: Level entry     Home Layout: One level Home Equipment: Cane - single point;Hand held shower head;Shower seat      Prior Function Level of Independence: Independent         Comments: Does not drive anymore due to vision problems.      Hand Dominance   Dominant Hand: Right    Extremity/Trunk Assessment   Upper Extremity Assessment Upper Extremity Assessment: Defer to OT evaluation    Lower Extremity Assessment Lower Extremity Assessment: Overall WFL for tasks assessed    Cervical / Trunk Assessment Cervical / Trunk Assessment: Normal  Communication   Communication: No difficulties  Cognition Arousal/Alertness: Awake/alert Behavior During Therapy: WFL for tasks assessed/performed Overall Cognitive Status: Within Functional Limits for tasks assessed  General Comments      Exercises     Assessment/Plan    PT Assessment Patent does not need any further PT services  PT Problem List            PT Treatment Interventions      PT Goals (Current goals can be found in the Care Plan section)  Acute Rehab PT Goals Patient Stated Goal: to go home     Frequency      Barriers to discharge        Co-evaluation               End of Session   Activity Tolerance: Patient tolerated treatment well Patient left: in chair;with call bell/phone within reach Nurse Communication: Mobility status    Functional Assessment Tool Used: clinical judgement Functional Limitation: Mobility: Walking and moving around Mobility: Walking and Moving Around Current Status [phone]): 0 percent impaired, limited or restricted Mobility: Walking and Moving Around Goal Status [phone]): 0 percent impaired, limited or restricted Mobility: Walking and Moving Around Discharge Status [phone]): 0 percent impaired, limited or restricted    Time: 1325-1338 PT Time Calculation (min) (ACUTE ONLY): 13 min   Charges:   PT Evaluation $PT Eval Low Complexity: 1 Procedure     PT G Codes:   PT G-Codes **NOT FOR INPATIENT CLASS** Functional Assessment Tool Used: clinical judgement Functional Limitation: Mobility: Walking and moving around Mobility: Walking and Moving Around Current Status [id number]): 0 percent impaired, limited or restricted Mobility: Walking and Moving Around Goal Status [id number]): 0 percent impaired, limited or restricted Mobility: Walking and Moving Around Discharge Status [id number]): 0 percent impaired, limited or restricted    [facility] 11/26/2016, 2:16 PM Sherie Don, [place], DPT [phone]

## 2016-11-26 NOTE — Progress Notes (Signed)
STROKE TEAM PROGRESS NOTE   HISTORY OF PRESENT ILLNESS (per record) [name] is a 76 y.o. male who was in his normal state of health around 3 PM when he said hello to somebody (LKW 3 PM on 11/24/2016). Subsequently when his family arrived home around 4 he noticed that his speech was slurred and initially worsened, but has subsequently improved some. He also felt like he had some trouble with his right arm earlier, but this appears to be mostly resolved. He continues, however, to have persistent dysarthria. Patient was not administered IV t-PA secondary to . Mild symptoms. He was admitted for further evaluation and treatment.   SUBJECTIVE (INTERVAL HISTORY) No family at bedside. Pt is working with PT and walking in hallway without difficulty.    OBJECTIVE Temp:  [98 F (36.7 C)-98.6 F (37 C)] 98 F (36.7 C) (01/31 2135) Pulse Rate:  [67-93] 67 (01/31 2135) Cardiac Rhythm: Ventricular paced (01/31 1900) Resp:  [16-18] 18 (01/31 2135) BP: (109-121)/(63-83) 119/71 (01/31 2135) SpO2:  [95 %-99 %] 99 % (01/31 2135)  CBC:   Recent Labs Lab 11/24/16 1832 11/24/16 1852 11/26/16 0625  WBC 16.1*  --  14.1*  NEUTROABS 10.8*  --   --   HGB 15.3 15.3 14.8  HCT 44.0 45.0 43.8  MCV 89.4  --  90.5  PLT 242  --  [id number]    Basic Metabolic Panel:   Recent Labs Lab 11/24/16 1832 11/24/16 1852 11/25/16 0821 11/26/16 0625  NA 136 138  --  135  K 3.4* 3.4*  --  3.7  CL 98* 98*  --  98*  CO2 24  --   --  28  GLUCOSE 121* 124*  --  151*  BUN 18 23*  --  19  CREATININE 1.18 1.30*  --  1.38*  CALCIUM 9.7  --   --  9.0  MG  --   --  1.9  --     Lipid Panel:     Component Value Date/Time   CHOL 112 11/25/2016 0821   TRIG 136 11/25/2016 0821   HDL 38 (L) 11/25/2016 0821   CHOLHDL 2.9 11/25/2016 0821   VLDL 27 11/25/2016 0821   LDLCALC 47 11/25/2016 0821   HgbA1c:  Lab Results  Component Value Date   HGBA1C 7.1 (H) 11/25/2016   Urine Drug Screen: No results found for:  LABOPIA, COCAINSCRNUR, LABBENZ, AMPHETMU, THCU, LABBARB    IMAGING I have personally reviewed the radiological images below and agree with the radiology interpretations.  Ct Head Code Stroke W/o Cm 11/24/2016 1. No acute intracranial abnormality identified. If symptoms persist or if clinically indicated MRI is more sensitive for acute stroke. 2. Advanced chronic microvascular ischemic changes of the brain and mild brain parenchymal volume loss. 3. ASPECTS is 10   Carotid Doppler   There is 1-39% bilateral ICA stenosis. Vertebral artery flow is antegrade.    Mri and Mra Brain Wo Contrast 11/25/2016 IMPRESSION: 1. No acute ischemia. 2. Loss of normal flow related enhancement within the proximal left M2 segment superior division with normal enhancement distally, consistent with severe stenosis or nonocclusive thrombus at this location. 3. Severe chronic microvascular ischemia.  TTE - Left ventricle: The cavity size was normal. Wall thickness was   increased in a pattern of moderate LVH. There was focal basal   hypertrophy. Systolic function was vigorous. The estimated   ejection fraction was in the range of 65% to 70%. Akinesis of the   mid-apicalanteroseptal  myocardium. Doppler parameters are   consistent with abnormal left ventricular relaxation (grade 1   diastolic dysfunction). - Ventricular septum: Septal motion showed abnormal function and   dyssynergy.   PHYSICAL EXAM  Temp:  [98 F (36.7 C)-98.6 F (37 C)] 98 F (36.7 C) (01/31 2135) Pulse Rate:  [67-93] 67 (01/31 2135) Resp:  [16-18] 18 (01/31 2135) BP: (109-121)/(63-83) 119/71 (01/31 2135) SpO2:  [95 %-99 %] 99 % (01/31 2135)  General - Well nourished, well developed, in no apparent distress.  Ophthalmologic - Sharp disc margins OU.   Cardiovascular - Regular rate and rhythm.  Mental Status -  Level of arousal and orientation to time, place, and person were intact. Language including expression, naming, repetition,  comprehension was assessed and found intact. Fund of Knowledge was assessed and was intact.  Cranial Nerves II - XII - II - Visual field intact OU. III, IV, VI - Extraocular movements intact. V - Facial sensation intact bilaterally. VII - Facial movement intact bilaterally. VIII - Hearing & vestibular intact bilaterally. X - Palate elevates symmetrically. XI - Chin turning & shoulder shrug intact bilaterally. XII - Tongue protrusion intact.  Motor Strength - The patient's strength was normal in all extremities and pronator drift was absent.  Bulk was normal and fasciculations were absent.   Motor Tone - Muscle tone was assessed at the neck and appendages and was normal.  Reflexes - The patient's reflexes were 1+ in all extremities and he had no pathological reflexes.  Sensory - Light touch, temperature/pinprick were assessed and were symmetrical.    Coordination - The patient had normal movements in the hands and feet with no ataxia or dysmetria.  Tremor was absent.  Gait and Station - deferred.   ASSESSMENT/PLAN Mr. [name] is a 76 y.o. male with history of CAD, strokes x 2, CHB s/p pacer placement in Oct, PVD s/p bilat CEA presenting with slurred speech. He did not receive IV t-PA due to Mild symptoms.   TIA:  L brain TIA likely related to left M2 high grade stenosis  Resultant  Deficit resolved  Code stroke CT negative. Aspects 10  MRI  / MRA  No acute stroke but left M2 high grade stensosi  Carotid Doppler  No significant stenosis   2D Echo EF 65-70%   LDL 47  HgbA1c 7.1  Lovenox 40 mg sq daily for VTE prophylaxis Diet Heart Room service appropriate? Yes; Fluid consistency: Thin  aspirin 81 mg daily prior to admission, now on aspirin 325 mg daily. Due to intracranial stenosis with fluctuating symptoms, recommend ASA and plavix for dual antiplatelet for 3 months than then plavix alone. plavix loading dose given  Patient counseled to be compliant with his  antithrombotic medications  Ongoing aggressive stroke risk factor management  Therapy recommendations:  pending   Disposition:  pending   B/l carotid stenosis s/p b/l CEA in the past  CUS this time unremarkable  Following with [facility]  Hypertension  Stable  Permissive hypertension (OK if < 220/120) but gradually normalize in 5-7 days  Long-term BP goal 130-150 due to left M2 high grade stenosis  Hyperlipidemia  Home meds:  Lipitor 40, resumed in hospital  LDL 47, goal < 70  Continue statin at discharge  Other Stroke Risk Factors  Advanced age  Obesity  Hx stroke/TIA  2014 - Unable to find documentation of stroke in 2014. Patient did have carotid endarterectomies 2 this year. No associated strokes.  05/2009 . Left brain subcortical  infarct, status post two thirds dose IV TPA, infarct secondary to small vessel disease  Coronary artery disease s/p MI x 3  Other Active Problems  Hypokalemia  Complete heart block with pacemaker placement October 2017  Hospital day # 0  Neurology will sign off. Please call with questions. Pt will follow up with Cecille Rubin NP at [facility] in about 6 weeks. Thanks for the consult.   Rosalin Hawking, MD PhD Stroke Neurology 11/26/2016 11:48 PM    To contact Stroke Continuity provider, please refer to [email or web address] After hours, contact General Neurology

## 2016-11-27 DIAGNOSIS — I442 Atrioventricular block, complete: Secondary | ICD-10-CM | POA: Diagnosis not present

## 2016-11-27 DIAGNOSIS — I2510 Atherosclerotic heart disease of native coronary artery without angina pectoris: Secondary | ICD-10-CM | POA: Diagnosis not present

## 2016-11-27 DIAGNOSIS — E876 Hypokalemia: Secondary | ICD-10-CM | POA: Diagnosis not present

## 2016-11-27 DIAGNOSIS — R4781 Slurred speech: Secondary | ICD-10-CM | POA: Diagnosis not present

## 2016-11-27 LAB — VAS US CAROTID
LCCADSYS: 75 cm/s
LEFT ECA DIAS: -20 cm/s
LEFT VERTEBRAL DIAS: 21 cm/s
Left CCA dist dias: 12 cm/s
Left CCA prox dias: 18 cm/s
Left CCA prox sys: 100 cm/s
Left ICA dist dias: -22 cm/s
Left ICA dist sys: -82 cm/s
Left ICA prox dias: -21 cm/s
Left ICA prox sys: -82 cm/s
RCCAPDIAS: 26 cm/s
RCCAPSYS: 123 cm/s
RIGHT ECA DIAS: -13 cm/s
RIGHT VERTEBRAL DIAS: 9 cm/s
Right cca dist sys: -70 cm/s

## 2016-11-27 MED ORDER — CLOPIDOGREL BISULFATE 75 MG PO TABS: 75.0000 mg | ORAL_TABLET | Freq: Every day | ORAL | 0 refills | Status: DC

## 2016-11-27 MED ORDER — ASPIRIN EC 81 MG PO TBEC: 81.0000 mg | DELAYED_RELEASE_TABLET | Freq: Every day | ORAL | 0 refills | Status: DC

## 2016-11-27 NOTE — Discharge Summary (Signed)
Physician Discharge Summary  [name] [id number] DOB: 04-26-1941 DOA: 11/24/2016  PCP: Monico Blitz, MD  Admit date: 11/24/2016 Discharge date: 11/27/2016  Admitted From: Home Disposition:  Home  Recommendations for Outpatient Follow-up:  1. Follow up with PCP in 1-2 weeks 2. Please monitor bp as outpatient and resume/titrate bp meds as tolerated 3. Please follow up on the following pending results:   Per Neurology: Due to intracranial stenosis with fluctuating symptoms, recommend ASA and plavix for dual antiplatelet for 3 months than then plavix alone. plavix loading dose given  Discharge Condition:Stable CODE STATUS:Full Diet recommendation: Heart healthy   Brief/Interim Summary: 76 y.o male who was brought to the ED by a friend last night (1/29) for slurred speech and general weakness that was concentrated to the right side. PMH significant for CAD, carotid artery occlusion, hypertension, MI in 2014, 2015 and 2016, stroke in 2010, 2014, as well as transient cerebral ischemia. The stroke team assessed the patient and gave him an NIH score of 1. CT head ordered on admission reveals advanced chronic microvascular ischemic changes of the brain and mild brain parenchymal volume loss. Patient was given aspirin therapy. Awaiting brain MRI, MRA head, and bilateral carotid ultrasound.   TIA - CT head revealed transient cerebral ischemia - MRI, MRA head with findings consistent with severe stenosis or nonocclusive thrombus within prox M2 segment superior division. - Bilateral carotid ultrasound done, no significant stenosis - symptoms have since resolved. Stable at present - Neurology following. Recommendations to continue aspirin and plavix x 3 months, then plavix alone afterwards  Hypokalemia - 1/29 >>3.4.  - Magnesium 1/30 >> 1.9.  Hypertension  - Stable and controlled off Amlodipine and HCTZ.  - Please monitor bp as outpatient and resume/titrate bp meds as  tolerated  Coronary Artery Disease - Continue lipitor.  - Stable at present  Complete heart block with pacer placed on October 2017. -Stable at present  Discharge Diagnoses:  Principal Problem:   Slurred speech Active Problems:   Complete heart block ([name])   Coronary artery disease due to lipid rich plaque   CKD (chronic kidney disease) stage 2, GFR 60-89 ml/min   Essential hypertension   Hypokalemia   Middle cerebral artery stenosis, left   Cerebrovascular accident (CVA) [facility])    Discharge Instructions  Discharge Instructions    Ambulatory referral to Neurology    Complete by:  As directed    Follow up with NP Cecille Rubin at [facility] in about 2 months. Thanks.     Allergies as of 11/27/2016      Reactions   Strawberry Extract Shortness Of Breath, Swelling   Lips and throat swelled   Ace Inhibitors Other (See Comments)   Possible angioedema   Cardura [doxazosin Mesylate] Other (See Comments)   Erythema   Penicillins Rash   Has patient had a PCN reaction causing immediate rash, facial/tongue/throat swelling, SOB or lightheadedness with hypotension: Yes Has patient had a PCN reaction causing severe rash involving mucus membranes or skin necrosis: No Has patient had a PCN reaction that required hospitalization No Has patient had a PCN reaction occurring within the last 10 years: No If all of the above answers are "NO", then may proceed with Cephalosporin use.      Medication List    STOP taking these medications   amLODipine 10 MG tablet Commonly known as:  NORVASC   hydrochlorothiazide 25 MG tablet Commonly known as:  HYDRODIURIL     TAKE these medications   aspirin EC  81 MG tablet Take 1 tablet (81 mg total) by mouth daily.   atorvastatin 40 MG tablet Commonly known as:  LIPITOR Take 1 tablet (40 mg total) by mouth daily.   clopidogrel 75 MG tablet Commonly known as:  PLAVIX Take 1 tablet (75 mg total) by mouth daily. Start taking on:  11/28/2016    finasteride 5 MG tablet Commonly known as:  PROSCAR Take 5 mg by mouth daily.      Follow-up Information    Dennie Bible, NP. Schedule an appointment as soon as possible for a visit in 6 week(s).   Specialty:  Family Medicine Contact information: [address] [place] [address] [phone]        [facility], MD. Schedule an appointment as soon as possible for a visit in 2 week(s).   Specialty:  Internal Medicine Contact information: [address] [place] [address] [phone]          Allergies  Allergen Reactions  . Strawberry Extract Shortness Of Breath and Swelling    Lips and throat swelled  . Ace Inhibitors Other (See Comments)    Possible angioedema  . Cardura [Doxazosin Mesylate] Other (See Comments)    Erythema  . Penicillins Rash    Has patient had a PCN reaction causing immediate rash, facial/tongue/throat swelling, SOB or lightheadedness with hypotension: Yes Has patient had a PCN reaction causing severe rash involving mucus membranes or skin necrosis: No Has patient had a PCN reaction that required hospitalization No Has patient had a PCN reaction occurring within the last 10 years: No If all of the above answers are "NO", then may proceed with Cephalosporin use.    Consultations:  Neurology  Procedures/Studies: Mr Brain Wo Contrast  Result Date: 11/25/2016 CLINICAL DATA:  Slurred speech EXAM: MRI HEAD WITHOUT CONTRAST MRA HEAD WITHOUT CONTRAST TECHNIQUE: Multiplanar, multiecho pulse sequences of the brain and surrounding structures were obtained without intravenous contrast. Angiographic images of the head were obtained using MRA technique without contrast. COMPARISON:  Head CT 11/24/2016 FINDINGS: MRI HEAD FINDINGS Brain: No focal diffusion restriction to indicate acute infarct. No intraparenchymal hemorrhage. There is diffuse confluent hyperintense T2-weighted signal within the periventricular and deep white matter, most  often seen in the setting of chronic microvascular ischemia. There are multiple old lacunar infarcts. No mass lesion or midline shift. No hydrocephalus or extra-axial fluid collection. The midline structures are normal. No age advanced or lobar predominant atrophy. Vascular: Major intracranial arterial and venous sinus flow voids are preserved. No evidence of chronic microhemorrhage or amyloid angiopathy. Skull and upper cervical spine: The visualized skull base, calvarium, upper cervical spine and extracranial soft tissues are normal. Sinuses/Orbits: No fluid levels or advanced mucosal thickening. No mastoid effusion. Normal orbits. MRA HEAD FINDINGS Intracranial internal carotid arteries: Normal. Anterior cerebral arteries: Normal. Middle cerebral arteries: There is a focal defect of flow related enhancement within the proximal left M2 segments superior division. There is normal enhancement of the distal left MCA distribution. The right MCA is normal. Posterior communicating arteries: Present bilaterally. Posterior cerebral arteries: Bilateral fetal origins. Otherwise normal. Basilar artery: Normal. Vertebral arteries: Codominant. Normal. Superior cerebellar arteries: Normal. Anterior inferior cerebellar arteries: Normal. Posterior inferior cerebellar arteries: Normal. IMPRESSION: 1. No acute ischemia. 2. Loss of normal flow related enhancement within the proximal left M2 segment superior division with normal enhancement distally, consistent with severe stenosis or nonocclusive thrombus at this location. 3. Severe chronic microvascular ischemia. The findings were discussed with the patient's physician at 4:53  p.m. Electronically Signed   By: Ulyses Jarred [name]   On: 11/25/2016 16:58   Mr Jodene Nam Head/brain [id number] Cm  Result Date: 11/25/2016 CLINICAL DATA:  Slurred speech EXAM: MRI HEAD WITHOUT CONTRAST MRA HEAD WITHOUT CONTRAST TECHNIQUE: Multiplanar, multiecho pulse sequences of the brain and surrounding structures  were obtained without intravenous contrast. Angiographic images of the head were obtained using MRA technique without contrast. COMPARISON:  Head CT 11/24/2016 FINDINGS: MRI HEAD FINDINGS Brain: No focal diffusion restriction to indicate acute infarct. No intraparenchymal hemorrhage. There is diffuse confluent hyperintense T2-weighted signal within the periventricular and deep white matter, most often seen in the setting of chronic microvascular ischemia. There are multiple old lacunar infarcts. No mass lesion or midline shift. No hydrocephalus or extra-axial fluid collection. The midline structures are normal. No age advanced or lobar predominant atrophy. Vascular: Major intracranial arterial and venous sinus flow voids are preserved. No evidence of chronic microhemorrhage or amyloid angiopathy. Skull and upper cervical spine: The visualized skull base, calvarium, upper cervical spine and extracranial soft tissues are normal. Sinuses/Orbits: No fluid levels or advanced mucosal thickening. No mastoid effusion. Normal orbits. MRA HEAD FINDINGS Intracranial internal carotid arteries: Normal. Anterior cerebral arteries: Normal. Middle cerebral arteries: There is a focal defect of flow related enhancement within the proximal left M2 segments superior division. There is normal enhancement of the distal left MCA distribution. The right MCA is normal. Posterior communicating arteries: Present bilaterally. Posterior cerebral arteries: Bilateral fetal origins. Otherwise normal. Basilar artery: Normal. Vertebral arteries: Codominant. Normal. Superior cerebellar arteries: Normal. Anterior inferior cerebellar arteries: Normal. Posterior inferior cerebellar arteries: Normal. IMPRESSION: 1. No acute ischemia. 2. Loss of normal flow related enhancement within the proximal left M2 segment superior division with normal enhancement distally, consistent with severe stenosis or nonocclusive thrombus at this location. 3. Severe chronic  microvascular ischemia. The findings were discussed with the patient's physician at 4:53 p.m. Electronically Signed   By: Ulyses Jarred [name]   On: 11/25/2016 16:58   Ct Head Code Stroke W/o Cm  Result Date: 11/24/2016 CLINICAL DATA:  Code stroke. Slurred speech and right-sided weakness. EXAM: CT HEAD WITHOUT CONTRAST TECHNIQUE: Contiguous axial images were obtained from the base of the skull through the vertex without intravenous contrast. COMPARISON:  06/01/2015 CT head. FINDINGS: Brain: No evidence of acute infarction, hemorrhage, hydrocephalus, extra-axial collection or mass lesion/mass effect. Confluent hypoattenuation of white matter in the left-greater-than-right hemispheres is nonspecific but consistent with advanced chronic microvascular ischemic changes and mild brain parenchymal volume loss. There is a chronic lacunar infarct within the left lentiform nucleus. Vascular: Moderate calcific atherosclerosis of cavernous and paraclinoid internal carotid arteries. Skull: Normal. Negative for fracture or focal lesion. Sinuses/Orbits: No acute finding. Other: None. ASPECTS [facility] Stroke Program Early CT Score) - Ganglionic level infarction (caudate, lentiform nuclei, internal capsule, insula, M1-M3 cortex): 7 - Supraganglionic infarction (M4-M6 cortex): 3 Total score (0-10 with 10 being normal): 10 IMPRESSION: 1. No acute intracranial abnormality identified. If symptoms persist or if clinically indicated MRI is more sensitive for acute stroke. 2. Advanced chronic microvascular ischemic changes of the brain and mild brain parenchymal volume loss. 3. ASPECTS is 10 These results were called by telephone at the time of interpretation on 11/24/2016 at 6:56 pm to Dr. Flossie Dibble , who verbally acknowledged these results. Electronically Signed   By: Kristine Garbe [name]   On: 11/24/2016 18:58    Subjective: Eager to go home  Discharge Exam: Vitals:   11/27/16 0519 11/27/16 1001  BP:  (!) 110/93 (!) 113/52  Pulse: 64 80  Resp: 16 18  Temp: 97.8 F (36.6 C) 97.7 F (36.5 C)   Vitals:   11/26/16 2135 11/27/16 0136 11/27/16 0519 11/27/16 1001  BP: 119/71 (!) 102/56 (!) 110/93 (!) 113/52  Pulse: 67 66 64 80  Resp: 18 16 16 18   Temp: 98 F (36.7 C) 98 F (36.7 C) 97.8 F (36.6 C) 97.7 F (36.5 C)  TempSrc: Oral Oral Oral Oral  SpO2: 99% 98% 95% 96%  Weight:      Height:        General: Pt is alert, awake, not in acute distress Cardiovascular: RRR, S1/S2 +, no rubs, no gallops Respiratory: CTA bilaterally, no wheezing, no rhonchi Abdominal: Soft, NT, ND, bowel sounds + Extremities: no edema, no cyanosis   The results of significant diagnostics from this hospitalization (including imaging, microbiology, ancillary and laboratory) are listed below for reference.     Microbiology: No results found for this or any previous visit (from the past 240 hour(s)).   Labs: BNP (last 3 results) No results for input(s): BNP in the last 8760 hours. Basic Metabolic Panel:  Recent Labs Lab 11/24/16 1832 11/24/16 1852 11/25/16 0821 11/26/16 0625  NA 136 138  --  135  K 3.4* 3.4*  --  3.7  CL 98* 98*  --  98*  CO2 24  --   --  28  GLUCOSE 121* 124*  --  151*  BUN 18 23*  --  19  CREATININE 1.18 1.30*  --  1.38*  CALCIUM 9.7  --   --  9.0  MG  --   --  1.9  --    Liver Function Tests:  Recent Labs Lab 11/24/16 1832  AST 27  ALT 25  ALKPHOS 82  BILITOT 1.0  PROT 7.6  ALBUMIN 4.2   No results for input(s): LIPASE, AMYLASE in the last 168 hours. No results for input(s): AMMONIA in the last 168 hours. CBC:  Recent Labs Lab 11/24/16 1832 11/24/16 1852 11/26/16 0625  WBC 16.1*  --  14.1*  NEUTROABS 10.8*  --   --   HGB 15.3 15.3 14.8  HCT 44.0 45.0 43.8  MCV 89.4  --  90.5  PLT 242  --  241   Cardiac Enzymes: No results for input(s): CKTOTAL, CKMB, CKMBINDEX, TROPONINI in the last 168 hours. BNP: Invalid input(s): POCBNP CBG:  Recent  Labs Lab 11/24/16 1829  GLUCAP 123*   D-Dimer No results for input(s): DDIMER in the last 72 hours. Hgb A1c  Recent Labs  11/25/16 0821  HGBA1C 7.1*   Lipid Profile  Recent Labs  11/25/16 0821  CHOL 112  HDL 38*  LDLCALC 47  TRIG 136  CHOLHDL 2.9   Thyroid function studies No results for input(s): TSH, T4TOTAL, T3FREE, THYROIDAB in the last 72 hours.  Invalid input(s): FREET3 Anemia work up No results for input(s): VITAMINB12, FOLATE, FERRITIN, TIBC, IRON, RETICCTPCT in the last 72 hours. Urinalysis    Component Value Date/Time   COLORURINE STRAW (A) 11/24/2016 1858   APPEARANCEUR CLEAR 11/24/2016 1858   LABSPEC 1.004 (L) 11/24/2016 1858   PHURINE 7.0 11/24/2016 1858   GLUCOSEU NEGATIVE 11/24/2016 1858   HGBUR NEGATIVE 11/24/2016 [place] 11/24/2016 [place] 11/24/2016 1858   PROTEINUR NEGATIVE 11/24/2016 1858   UROBILINOGEN 0.2 06/03/2015 1303   NITRITE NEGATIVE 11/24/2016 1858   LEUKOCYTESUR SMALL (A) 11/24/2016 1858   Sepsis  Labs Invalid input(s): PROCALCITONIN,  WBC,  LACTICIDVEN Microbiology No results found for this or any previous visit (from the past 240 hour(s)).   SIGNED:   Donne Hazel, MD  [facility] 11/27/2016, 1:18 PM  If 7PM-7AM, please contact night-coverage [email or web address] Password [email or web address]

## 2016-11-27 NOTE — Progress Notes (Signed)
D/C instructions provided to patient. Denies questions/concerns at this time. Patient transported to front entrance via [facility] by this RN.

## 2016-11-27 NOTE — Evaluation (Signed)
Speech Language Pathology Evaluation Patient Details Name: [name] MRN: [id number] DOB: June 25, 1941 Today's Date: 11/27/2016 Time: [id number] SLP Time Calculation (min) (ACUTE ONLY): 24 min  Problem List:  Patient Active Problem List   Diagnosis Date Noted  . Cerebrovascular accident (CVA) ([place])   . Middle cerebral artery stenosis, left   . Slurred speech 11/24/2016  . Hypokalemia 11/24/2016  . GI bleed 08/22/16  . BRBPR (bright red blood per rectum) 2016-08-22  . Acute kidney injury ([place]) 08/22/16  . Leukocytosis 08-22-2016  . Coronary artery disease due to lipid rich plaque Aug 22, 2016  . CKD (chronic kidney disease) stage 2, GFR 60-89 ml/min 08-22-2016  . TIA (transient ischemic attack)   . Anxiety   . Acute GI bleeding   . Essential hypertension   . Complete heart block ([place]) 08/01/2016  . Dizziness 06/02/2015  . LBBB (left bundle branch block) 06/02/2015  . Aftercare following surgery of the circulatory system, [place] 11/10/2013  . Carotid stenosis 10/13/2013  . Occlusion and stenosis of carotid artery without mention of cerebral infarction 07/04/2013   Past Medical History:  Past Medical History:  Diagnosis Date  . Anxiety   . Arthritis    "hands" (2016-08-22)  . BPH (benign prostatic hypertrophy)   . BRBPR (bright red blood per rectum) ~ 01/2016; 10'/18/2017  . CAD (coronary artery disease) 08-22-2016  . Carotid artery occlusion   . Cerebrovascular disease, unspecified   . Cervicalgia   . Chronic kidney disease (CKD), stage III (moderate)    [name] 11/24/2016  . Enlarged prostate   . Hypertension   . Inguinal hernia without mention of obstruction or gangrene, unilateral or unspecified, (not specified as recurrent)   . Myocardial infarction 2014; 2015; 2016   "told I'd had the 1st 2 while in the hospital in 2016; didn't even know it" (2016/08/22)  . Other psoriasis   . Pain in limb   . Presence of permanent cardiac pacemaker   . Situational depression     "after my daughter died in January 03, 2015" (08/22/2016)  . Stroke [facility]) 2010; 2014   "right sided weakness since" (11/25/2016)  . Unspecified hemorrhoids without mention of complication   . Unspecified transient cerebral ischemia   . Varicose veins    Past Surgical History:  Past Surgical History:  Procedure Laterality Date  . BACK SURGERY    . CARDIAC CATHETERIZATION N/A 06/06/2015   Procedure: Left Heart Cath and Coronary Angiography;  Surgeon: Wellington Hampshire, MD;  Location: [place] CV LAB;  Service: Cardiovascular;  Laterality: N/A;  . CAROTID ENDARTERECTOMY Right 10-13-13   cea  . COLONOSCOPY    . COLONOSCOPY N/A 08/14/2016   Procedure: COLONOSCOPY;  Surgeon: Manus Gunning, MD;  Location: [facility] ENDOSCOPY;  Service: Gastroenterology;  Laterality: N/A;  . ENDARTERECTOMY Left 07/14/2013   Procedure: ENDARTERECTOMY CAROTID-LEFT;  Surgeon: Serafina Mitchell, MD;  Location: [place];  Service: Vascular;  Laterality: Left;  . ENDARTERECTOMY Right 10/13/2013   Procedure: RIGHT CAROTID ARTERY ENDARTERECTOMY WITH VASCU GUARD PATCH ANGIOPLASTY;  Surgeon: Serafina Mitchell, MD;  Location: [place];  Service: Vascular;  Laterality: Right;  . EP IMPLANTABLE DEVICE N/A 08/01/2016   Procedure: Pacemaker Implant;  Surgeon: Will Meredith Leeds, MD;  Location: [place] CV LAB;  Service: Cardiovascular;  Laterality: N/A;  . INGUINAL HERNIA REPAIR Left   . INSERT / REPLACE / REMOVE PACEMAKER    . [place]   "ruptured disc"; Dr. Amado Coe  . PATCH ANGIOPLASTY Left 07/14/2013  Procedure: PATCH ANGIOPLASTY using Vascu-Guard Vascular Patch;  Surgeon: Serafina Mitchell, MD;  Location: [place];  Service: Vascular;  Laterality: Left;  Marland Kitchen VASECTOMY     HPI:  76 y.o. male with a past medical history significant for CAD, strokes x2, CHB s/p pacer placement last Oct, PVD s/p bilat CEA who presents with slurred speech, transiently;  MRI head indicated No acute ischemia. 2. Loss of normal flow related  enhancement within the proximal left M2 segment superior division with normal enhancement distally, consistent with severe stenosis or nonocclusive thrombus at this location. 3. Severe chronic microvascular ischemia.  Assessment / Plan / Recommendation Clinical Impression   Pt appears to exhibit speech, language and cognition which is [facility] at present time; pt denies dysphagia as well; passed NSSS on 11/24/16; ST not warranted at this time; s/o    SLP Assessment  Patient does not need any further Speech Language Pathology Services    Follow Up Recommendations  None    Frequency and Duration  n/a         SLP Evaluation Cognition  Overall Cognitive Status: Within Functional Limits for tasks assessed Arousal/Alertness: Awake/alert Orientation Level: Oriented X4 Memory: Appears intact Awareness: Appears intact Problem Solving: Appears intact Safety/Judgment: Appears intact       Comprehension  Auditory Comprehension Overall Auditory Comprehension: Appears within functional limits for tasks assessed Yes/No Questions: Within Functional Limits Commands: Within Functional Limits Conversation: Complex Visual Recognition/Discrimination Discrimination: Not tested Reading Comprehension Reading Status: Unable to assess (comment) (Pt did not have glasses available)    Expression Expression Primary Mode of Expression: Verbal Verbal Expression Overall Verbal Expression: Appears within functional limits for tasks assessed Initiation: No impairment Level of Generative/Spontaneous Verbalization: Conversation Repetition: No impairment Naming: No impairment Pragmatics: No impairment Non-Verbal Means of Communication: Not applicable Written Expression Dominant Hand: Right Written Expression: Unable to assess (comment)   Oral / Motor  Oral Motor/Sensory Function Overall Oral Motor/Sensory Function: Within functional limits Motor Speech Overall Motor Speech: Appears within functional  limits for tasks assessed Respiration: Within functional limits Phonation: Normal Resonance: Within functional limits Articulation: Within functional limitis Intelligibility: Intelligible Motor Planning: Witnin functional limits Motor Speech Errors: Not applicable             Functional Assessment Tool Used:  (NOMS) Functional Limitations: Other Speech Language Pathology Other Speech-Language Pathology Functional Limitation [phone]): 0 percent impaired, limited or restricted         Ike Maragh,PAT, M.S., CCC-SLP 11/27/2016, 2:49 PM

## 2016-11-27 NOTE — Care Management Note (Signed)
Case Management Note  Patient Details  Name: [name] MRN: [id number] Date of Birth: July 31, 1941  Subjective/Objective:                    Action/Plan: Pt discharging home with self care. Pt with insurance and PCP. No further needs per CM.   Expected Discharge Date:  11/27/16               Expected Discharge Plan:  Home/Self Care  In-House Referral:     Discharge planning Services     Post Acute Care Choice:    Choice offered to:     DME Arranged:    DME Agency:     HH Arranged:    HH Agency:     Status of Service:  Completed, signed off  If discussed at [employer] of Stay Meetings, dates discussed:    Additional Comments:  Pollie Friar, RN 11/27/2016, 1:56 PM

## 2016-12-09 ENCOUNTER — Encounter: Payer: Medicare Other | Admitting: Gastroenterology

## 2016-12-15 NOTE — Progress Notes (Signed)
   11/27/16 1400  SLP Visit Information  SLP Received On 11/27/16  SLP Time Calculation  SLP Start Time (ACUTE ONLY) 1401  SLP Stop Time (ACUTE ONLY) 1425  SLP Time Calculation (min) (ACUTE ONLY) 24 min  Subjective  Subjective (Pt alert/cooperative/pleasant)  Patient/Family Stated Goal (Return home)  General Information  HPI 76 y.o. 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  Prior Functional Status  Cognitive/Linguistic Baseline WFL  Type of Home House  Lives With Alone  Available Help at Discharge Friend(s);Available 24 hours/day  Vocation Retired  Pain Assessment  Pain Assessment No/denies pain  Oral Motor/Sensory Function  Overall Oral Motor/Sensory Function WFL  Cognition  Overall Cognitive Status Within Functional Limits for tasks assessed  Arousal/Alertness Awake/alert  Orientation Level Oriented X4  Memory Appears intact  Awareness Appears intact  Problem Solving Appears intact  Safety/Judgment Appears intact  Auditory Comprehension  Overall Auditory Comprehension Appears within functional limits for tasks assessed  Yes/No Questions WFL  Commands WFL  Conversation Complex  Visual Recognition/Discrimination  Discrimination Not tested  Reading Comprehension  Reading Status Unable to assess (comment) (Pt did not have glasses available)  Expression  Primary Mode of Expression Verbal  Verbal Expression  Overall Verbal Expression Appears within functional limits for tasks assessed  Initiation No impairment  Level of Generative/Spontaneous Verbalization Conversation  Repetition No impairment  Naming No impairment  Pragmatics No impairment  Non-Verbal Means of Communication Not applicable  Written Expression  Dominant Hand Right  Written Expression Unable to assess (comment)  Motor Speech  Overall Motor Speech Appears within functional limits  for tasks assessed  Respiration WFL  Phonation Normal  Resonance [facility]  Articulation WFL  Intelligibility Intelligible  Motor Planning [facility]  Motor Speech Errors NA  SLP - End of Session  Patient left in bed;with call bell/phone within reach  Assessment  SLP Recommendation/Assessment Patient does not need any further Speech Lanaguage Pathology Services  No Skilled Speech Therapy Patient is independent with all cognitive/linguistic skills  SLP Recommendations  Follow up Recommendations None  SLP Equipment None recommended by SLP  Individuals Consulted  Consulted and Agree with Results and Recommendations Patient  SLP G-Codes **NOT FOR INPATIENT CLASS**  Functional Assessment Tool Used (NOMS)  Functional Limitations Spoken language expressive  Spoken Language Expression Current Status [phone]) [place]  Spoken Language Expression Goal Status [id number]) [place]  Spoken Language Expression Discharge Status [id number]) [place]  SLP Evaluations  $ SLP Speech Visit 1 Procedure  SLP Evaluations  $ SLP EVAL LANGUAGE/SOUND PRODUCTION 1 Procedure  late entry for evaluation complete by [place], CCC-SLP [phone]

## 2017-02-03 ENCOUNTER — Ambulatory Visit: Payer: Medicare Other | Admitting: Nurse Practitioner

## 2017-02-04 ENCOUNTER — Encounter: Payer: Self-pay | Admitting: Nurse Practitioner

## 2017-05-20 ENCOUNTER — Encounter: Payer: Self-pay | Admitting: Cardiology

## 2017-06-30 ENCOUNTER — Emergency Department: Payer: Medicare Other

## 2017-06-30 ENCOUNTER — Encounter: Payer: Self-pay | Admitting: Emergency Medicine

## 2017-06-30 ENCOUNTER — Inpatient Hospital Stay
Admission: EM | Admit: 2017-06-30 | Discharge: 2017-07-09 | DRG: 871 | Disposition: A | Discharge status: Skilled Nursing Facility | Payer: Medicare Other | Attending: Specialist | Admitting: Specialist

## 2017-06-30 DIAGNOSIS — N183 Chronic kidney disease, stage 3 (moderate): Secondary | ICD-10-CM | POA: Diagnosis present

## 2017-06-30 DIAGNOSIS — N400 Enlarged prostate without lower urinary tract symptoms: Secondary | ICD-10-CM | POA: Diagnosis present

## 2017-06-30 DIAGNOSIS — E1110 Type 2 diabetes mellitus with ketoacidosis without coma: Secondary | ICD-10-CM | POA: Diagnosis present

## 2017-06-30 DIAGNOSIS — A419 Sepsis, unspecified organism: Secondary | ICD-10-CM | POA: Diagnosis not present

## 2017-06-30 DIAGNOSIS — Z8249 Family history of ischemic heart disease and other diseases of the circulatory system: Secondary | ICD-10-CM

## 2017-06-30 DIAGNOSIS — J9690 Respiratory failure, unspecified, unspecified whether with hypoxia or hypercapnia: Secondary | ICD-10-CM

## 2017-06-30 DIAGNOSIS — E785 Hyperlipidemia, unspecified: Secondary | ICD-10-CM | POA: Diagnosis present

## 2017-06-30 DIAGNOSIS — T380X5A Adverse effect of glucocorticoids and synthetic analogues, initial encounter: Secondary | ICD-10-CM | POA: Diagnosis not present

## 2017-06-30 DIAGNOSIS — Z880 Allergy status to penicillin: Secondary | ICD-10-CM | POA: Diagnosis not present

## 2017-06-30 DIAGNOSIS — E11649 Type 2 diabetes mellitus with hypoglycemia without coma: Secondary | ICD-10-CM | POA: Diagnosis not present

## 2017-06-30 DIAGNOSIS — E1122 Type 2 diabetes mellitus with diabetic chronic kidney disease: Secondary | ICD-10-CM | POA: Diagnosis present

## 2017-06-30 DIAGNOSIS — J9601 Acute respiratory failure with hypoxia: Secondary | ICD-10-CM | POA: Diagnosis present

## 2017-06-30 DIAGNOSIS — Z7982 Long term (current) use of aspirin: Secondary | ICD-10-CM | POA: Diagnosis not present

## 2017-06-30 DIAGNOSIS — I2510 Atherosclerotic heart disease of native coronary artery without angina pectoris: Secondary | ICD-10-CM | POA: Diagnosis present

## 2017-06-30 DIAGNOSIS — R059 Cough, unspecified: Secondary | ICD-10-CM

## 2017-06-30 DIAGNOSIS — I129 Hypertensive chronic kidney disease with stage 1 through stage 4 chronic kidney disease, or unspecified chronic kidney disease: Secondary | ICD-10-CM | POA: Diagnosis present

## 2017-06-30 DIAGNOSIS — E876 Hypokalemia: Secondary | ICD-10-CM | POA: Diagnosis present

## 2017-06-30 DIAGNOSIS — Z888 Allergy status to other drugs, medicaments and biological substances status: Secondary | ICD-10-CM | POA: Diagnosis not present

## 2017-06-30 DIAGNOSIS — N179 Acute kidney failure, unspecified: Secondary | ICD-10-CM | POA: Diagnosis present

## 2017-06-30 DIAGNOSIS — E871 Hypo-osmolality and hyponatremia: Secondary | ICD-10-CM | POA: Diagnosis present

## 2017-06-30 DIAGNOSIS — J189 Pneumonia, unspecified organism: Secondary | ICD-10-CM | POA: Diagnosis present

## 2017-06-30 DIAGNOSIS — E119 Type 2 diabetes mellitus without complications: Secondary | ICD-10-CM

## 2017-06-30 DIAGNOSIS — Z8673 Personal history of transient ischemic attack (TIA), and cerebral infarction without residual deficits: Secondary | ICD-10-CM | POA: Diagnosis not present

## 2017-06-30 DIAGNOSIS — Z9102 Food additives allergy status: Secondary | ICD-10-CM

## 2017-06-30 DIAGNOSIS — Z950 Presence of cardiac pacemaker: Secondary | ICD-10-CM

## 2017-06-30 DIAGNOSIS — R05 Cough: Secondary | ICD-10-CM

## 2017-06-30 DIAGNOSIS — R0602 Shortness of breath: Secondary | ICD-10-CM | POA: Diagnosis present

## 2017-06-30 DIAGNOSIS — I252 Old myocardial infarction: Secondary | ICD-10-CM

## 2017-06-30 HISTORY — DX: Heart failure, unspecified: I50.9

## 2017-06-30 LAB — PROTIME-INR
INR: 1.24
Prothrombin Time: 15.5 seconds — ABNORMAL HIGH (ref 11.4–15.2)

## 2017-06-30 LAB — COMPREHENSIVE METABOLIC PANEL
ALBUMIN: 2.9 g/dL — AB (ref 3.5–5.0)
ALK PHOS: 136 U/L — AB (ref 38–126)
ALT: 67 U/L — AB (ref 17–63)
AST: 64 U/L — AB (ref 15–41)
Anion gap: 17 — ABNORMAL HIGH (ref 5–15)
BUN: 34 mg/dL — AB (ref 6–20)
CHLORIDE: 86 mmol/L — AB (ref 101–111)
CO2: 27 mmol/L (ref 22–32)
CREATININE: 1.57 mg/dL — AB (ref 0.61–1.24)
Calcium: 7.9 mg/dL — ABNORMAL LOW (ref 8.9–10.3)
GFR calc Af Amer: 47 mL/min — ABNORMAL LOW (ref >60)
GFR calc non Af Amer: 41 mL/min — ABNORMAL LOW (ref >60)
GLUCOSE: 410 mg/dL — AB (ref 65–99)
Potassium: 3.2 mmol/L — ABNORMAL LOW (ref 3.5–5.1)
SODIUM: 130 mmol/L — AB (ref 135–145)
Total Bilirubin: 1.5 mg/dL — ABNORMAL HIGH (ref 0.3–1.2)
Total Protein: 7.6 g/dL (ref 6.5–8.1)

## 2017-06-30 LAB — MRSA PCR SCREENING: MRSA BY PCR: NEGATIVE

## 2017-06-30 LAB — GLUCOSE, CAPILLARY
GLUCOSE-CAPILLARY: 278 mg/dL — AB (ref 65–99)
GLUCOSE-CAPILLARY: 224 mg/dL — AB (ref 65–99)
GLUCOSE-CAPILLARY: 200 mg/dL — AB (ref 65–99)
GLUCOSE-CAPILLARY: 206 mg/dL — AB (ref 65–99)
GLUCOSE-CAPILLARY: 176 mg/dL — AB (ref 65–99)
Glucose-Capillary: 446 mg/dL — ABNORMAL HIGH (ref 65–99)
Glucose-Capillary: 378 mg/dL — ABNORMAL HIGH (ref 65–99)
Glucose-Capillary: 195 mg/dL — ABNORMAL HIGH (ref 65–99)
Glucose-Capillary: 148 mg/dL — ABNORMAL HIGH (ref 65–99)
Glucose-Capillary: 113 mg/dL — ABNORMAL HIGH (ref 65–99)
Glucose-Capillary: 109 mg/dL — ABNORMAL HIGH (ref 65–99)
Glucose-Capillary: 136 mg/dL — ABNORMAL HIGH (ref 65–99)

## 2017-06-30 LAB — CBC WITH DIFFERENTIAL/PLATELET
BASOS ABS: 0 10*3/uL (ref 0–0.1)
Basophils Relative: 0 %
Eosinophils Absolute: 0 10*3/uL (ref 0–0.7)
Eosinophils Relative: 0 %
HEMATOCRIT: 38.5 % — AB (ref 40.0–52.0)
Hemoglobin: 13.4 g/dL (ref 13.0–18.0)
LYMPHS ABS: 1 10*3/uL (ref 1.0–3.6)
LYMPHS PCT: 3 %
MCH: 31.1 pg (ref 26.0–34.0)
MCHC: 35 g/dL (ref 32.0–36.0)
MCV: 89.1 fL (ref 80.0–100.0)
MONO ABS: 2 10*3/uL — AB (ref 0.2–1.0)
MONOS PCT: 7 %
NEUTROS ABS: 26.3 10*3/uL — AB (ref 1.4–6.5)
Neutrophils Relative %: 90 %
Platelets: 386 10*3/uL (ref 150–440)
RBC: 4.32 MIL/uL — ABNORMAL LOW (ref 4.40–5.90)
RDW: 13.3 % (ref 11.5–14.5)
WBC: 29.3 10*3/uL — ABNORMAL HIGH (ref 3.8–10.6)

## 2017-06-30 LAB — ALBUMIN: ALBUMIN: 2.5 g/dL — AB (ref 3.5–5.0)

## 2017-06-30 LAB — LACTIC ACID, PLASMA
LACTIC ACID, VENOUS: 2.2 mmol/L — AB (ref 0.5–1.9)
Lactic Acid, Venous: 1.8 mmol/L (ref 0.5–1.9)

## 2017-06-30 LAB — HEMOGLOBIN A1C
Hgb A1c MFr Bld: 8.7 % — ABNORMAL HIGH (ref 4.8–5.6)
Mean Plasma Glucose: 202.99 mg/dL

## 2017-06-30 LAB — APTT: aPTT: 32 seconds (ref 24–36)

## 2017-06-30 LAB — BASIC METABOLIC PANEL
Anion gap: 12 (ref 5–15)
Anion gap: 10 (ref 5–15)
BUN: 33 mg/dL — AB (ref 6–20)
BUN: 30 mg/dL — ABNORMAL HIGH (ref 6–20)
CHLORIDE: 96 mmol/L — AB (ref 101–111)
CO2: 30 mmol/L (ref 22–32)
CO2: 30 mmol/L (ref 22–32)
CREATININE: 1.63 mg/dL — AB (ref 0.61–1.24)
CREATININE: 1.43 mg/dL — AB (ref 0.61–1.24)
Calcium: 7.7 mg/dL — ABNORMAL LOW (ref 8.9–10.3)
Calcium: 7.8 mg/dL — ABNORMAL LOW (ref 8.9–10.3)
Chloride: 90 mmol/L — ABNORMAL LOW (ref 101–111)
GFR calc non Af Amer: 39 mL/min — ABNORMAL LOW (ref >60)
GFR calc non Af Amer: 45 mL/min — ABNORMAL LOW (ref >60)
GFR, EST AFRICAN AMERICAN: 45 mL/min — AB (ref >60)
GFR, EST AFRICAN AMERICAN: 53 mL/min — AB (ref >60)
Glucose, Bld: 345 mg/dL — ABNORMAL HIGH (ref 65–99)
Glucose, Bld: 167 mg/dL — ABNORMAL HIGH (ref 65–99)
POTASSIUM: 3.1 mmol/L — AB (ref 3.5–5.1)
Potassium: 2.5 mmol/L — CL (ref 3.5–5.1)
Sodium: 132 mmol/L — ABNORMAL LOW (ref 135–145)
Sodium: 136 mmol/L (ref 135–145)

## 2017-06-30 LAB — MAGNESIUM: Magnesium: 2.1 mg/dL (ref 1.7–2.4)

## 2017-06-30 LAB — BRAIN NATRIURETIC PEPTIDE: B NATRIURETIC PEPTIDE 5: 131 pg/mL — AB (ref 0.0–100.0)

## 2017-06-30 LAB — PROCALCITONIN: Procalcitonin: 1.12 ng/mL

## 2017-06-30 MED ORDER — SODIUM CHLORIDE 0.9 % IV SOLN
1000.0000 mL | Freq: Once | INTRAVENOUS | Status: AC
  Administered 2017-06-30: 1000 mL via INTRAVENOUS

## 2017-06-30 MED ORDER — KCL IN DEXTROSE-NACL 40-5-0.45 MEQ/L-%-% IV SOLN
INTRAVENOUS | Status: DC
  Administered 2017-06-30: 16:00:00 via INTRAVENOUS
  Filled 2017-06-30 (×6): qty 1000

## 2017-06-30 MED ORDER — ALBUTEROL SULFATE (2.5 MG/3ML) 0.083% IN NEBU: 2.5000 mg | INHALATION_SOLUTION | Freq: Four times a day (QID) | RESPIRATORY_TRACT | Status: DC | PRN

## 2017-06-30 MED ORDER — ACETAMINOPHEN 325 MG PO TABS
650.0000 mg | ORAL_TABLET | Freq: Four times a day (QID) | ORAL | Status: DC | PRN
  Administered 2017-07-01 – 2017-07-02 (×2): 650 mg via ORAL
  Filled 2017-06-30 (×2): qty 2

## 2017-06-30 MED ORDER — POTASSIUM CHLORIDE IN NACL 40-0.9 MEQ/L-% IV SOLN
INTRAVENOUS | Status: DC
  Filled 2017-06-30 (×2): qty 1000

## 2017-06-30 MED ORDER — POLYETHYLENE GLYCOL 3350 17 G PO PACK: 17.0000 g | PACK | Freq: Every day | ORAL | Status: DC | PRN

## 2017-06-30 MED ORDER — ENOXAPARIN SODIUM 40 MG/0.4ML ~~LOC~~ SOLN
40.0000 mg | SUBCUTANEOUS | Status: DC
  Administered 2017-06-30 – 2017-07-08 (×9): 40 mg via SUBCUTANEOUS
  Filled 2017-06-30 (×9): qty 0.4

## 2017-06-30 MED ORDER — TRAMADOL HCL 50 MG PO TABS
50.0000 mg | ORAL_TABLET | Freq: Four times a day (QID) | ORAL | Status: DC | PRN
  Administered 2017-07-08: 50 mg via ORAL
  Filled 2017-06-30: qty 1

## 2017-06-30 MED ORDER — VITAMIN C 500 MG PO TABS
500.0000 mg | ORAL_TABLET | Freq: Every day | ORAL | Status: DC
  Administered 2017-06-30 – 2017-07-09 (×10): 500 mg via ORAL
  Filled 2017-06-30 (×10): qty 1

## 2017-06-30 MED ORDER — ASPIRIN EC 81 MG PO TBEC
81.0000 mg | DELAYED_RELEASE_TABLET | Freq: Every day | ORAL | Status: DC
  Administered 2017-06-30 – 2017-07-09 (×10): 81 mg via ORAL
  Filled 2017-06-30 (×10): qty 1

## 2017-06-30 MED ORDER — ACETAMINOPHEN 650 MG RE SUPP: 650.0000 mg | Freq: Four times a day (QID) | RECTAL | Status: DC | PRN

## 2017-06-30 MED ORDER — INSULIN GLARGINE 100 UNIT/ML ~~LOC~~ SOLN
10.0000 [IU] | SUBCUTANEOUS | Status: DC
  Administered 2017-06-30 – 2017-07-04 (×5): 10 [IU] via SUBCUTANEOUS
  Filled 2017-06-30 (×6): qty 0.1

## 2017-06-30 MED ORDER — POTASSIUM CHLORIDE 10 MEQ/100ML IV SOLN
10.0000 meq | INTRAVENOUS | Status: AC
  Administered 2017-06-30 (×2): 10 meq via INTRAVENOUS
  Filled 2017-06-30 (×2): qty 100

## 2017-06-30 MED ORDER — AZITHROMYCIN 250 MG PO TABS
250.0000 mg | ORAL_TABLET | Freq: Every day | ORAL | Status: AC
  Administered 2017-07-01 – 2017-07-06 (×6): 250 mg via ORAL
  Filled 2017-06-30 (×6): qty 1

## 2017-06-30 MED ORDER — GUAIFENESIN-DM 100-10 MG/5ML PO SYRP
5.0000 mL | ORAL_SOLUTION | Freq: Three times a day (TID) | ORAL | Status: DC | PRN
  Administered 2017-07-01: 5 mL via ORAL
  Filled 2017-06-30 (×3): qty 5

## 2017-06-30 MED ORDER — AMLODIPINE BESYLATE 10 MG PO TABS
10.0000 mg | ORAL_TABLET | Freq: Every day | ORAL | Status: DC
  Administered 2017-06-30 – 2017-07-09 (×8): 10 mg via ORAL
  Filled 2017-06-30 (×9): qty 1

## 2017-06-30 MED ORDER — DEXTROSE 5 % IV SOLN
1.0000 g | INTRAVENOUS | Status: AC
  Administered 2017-07-01 – 2017-07-06 (×6): 1 g via INTRAVENOUS
  Filled 2017-06-30 (×6): qty 10

## 2017-06-30 MED ORDER — INSULIN ASPART 100 UNIT/ML ~~LOC~~ SOLN
0.0000 [IU] | Freq: Three times a day (TID) | SUBCUTANEOUS | Status: DC
  Administered 2017-07-01: 5 [IU] via SUBCUTANEOUS
  Administered 2017-07-01: 11 [IU] via SUBCUTANEOUS
  Administered 2017-07-01: 3 [IU] via SUBCUTANEOUS
  Administered 2017-07-02: 8 [IU] via SUBCUTANEOUS
  Administered 2017-07-02: 11 [IU] via SUBCUTANEOUS
  Administered 2017-07-03: 5 [IU] via SUBCUTANEOUS
  Administered 2017-07-03 (×2): 3 [IU] via SUBCUTANEOUS
  Filled 2017-06-30 (×8): qty 1

## 2017-06-30 MED ORDER — FINASTERIDE 5 MG PO TABS
5.0000 mg | ORAL_TABLET | Freq: Every day | ORAL | Status: DC
  Administered 2017-06-30 – 2017-07-09 (×10): 5 mg via ORAL
  Filled 2017-06-30 (×10): qty 1

## 2017-06-30 MED ORDER — DEXTROSE 5 % IV SOLN
500.0000 mg | Freq: Once | INTRAVENOUS | Status: AC
  Administered 2017-06-30: 500 mg via INTRAVENOUS
  Filled 2017-06-30: qty 500

## 2017-06-30 MED ORDER — SODIUM CHLORIDE 0.9 % IV SOLN
INTRAVENOUS | Status: DC
  Administered 2017-06-30: 3.9 [IU]/h via INTRAVENOUS
  Filled 2017-06-30: qty 1

## 2017-06-30 MED ORDER — ATORVASTATIN CALCIUM 20 MG PO TABS
40.0000 mg | ORAL_TABLET | Freq: Every day | ORAL | Status: DC
  Administered 2017-06-30 – 2017-07-08 (×9): 40 mg via ORAL
  Filled 2017-06-30 (×9): qty 2

## 2017-06-30 MED ORDER — POTASSIUM CHLORIDE CRYS ER 20 MEQ PO TBCR
40.0000 meq | EXTENDED_RELEASE_TABLET | Freq: Once | ORAL | Status: AC
  Administered 2017-06-30: 40 meq via ORAL
  Filled 2017-06-30: qty 2

## 2017-06-30 MED ORDER — GUAIFENESIN ER 600 MG PO TB12
600.0000 mg | ORAL_TABLET | Freq: Two times a day (BID) | ORAL | Status: DC
  Administered 2017-06-30 – 2017-07-09 (×19): 600 mg via ORAL
  Filled 2017-06-30 (×19): qty 1

## 2017-06-30 MED ORDER — DEXTROSE 5 % IV SOLN
1.0000 g | Freq: Once | INTRAVENOUS | Status: AC
  Administered 2017-06-30: 1 g via INTRAVENOUS
  Filled 2017-06-30: qty 10

## 2017-06-30 MED ORDER — SODIUM CHLORIDE 0.9 % IV SOLN
INTRAVENOUS | Status: DC
  Administered 2017-06-30: 12:00:00 via INTRAVENOUS

## 2017-06-30 MED ORDER — GLUCOSAMINE 500 MG PO CAPS: 500.0000 mg | ORAL_CAPSULE | Freq: Every day | ORAL | Status: DC

## 2017-06-30 MED ORDER — ONDANSETRON HCL 4 MG PO TABS
4.0000 mg | ORAL_TABLET | Freq: Four times a day (QID) | ORAL | Status: DC | PRN
Start: 2017-06-30 — End: 2017-07-09

## 2017-06-30 MED ORDER — ONDANSETRON HCL 4 MG/2ML IJ SOLN: 4.0000 mg | Freq: Four times a day (QID) | INTRAMUSCULAR | Status: DC | PRN

## 2017-06-30 MED ORDER — VITAMIN E 15 UNIT/0.3ML PO SOLN
30.0000 [IU] | Freq: Every day | ORAL | Status: DC
  Administered 2017-07-01 – 2017-07-09 (×9): 30 [IU] via ORAL
  Filled 2017-06-30 (×9): qty 0.6

## 2017-06-30 MED ORDER — BENZONATATE 100 MG PO CAPS
100.0000 mg | ORAL_CAPSULE | Freq: Three times a day (TID) | ORAL | Status: DC | PRN
  Administered 2017-07-03: 100 mg via ORAL
  Filled 2017-06-30: qty 1

## 2017-06-30 NOTE — ED Notes (Signed)
CODE SEPSIS CALLED TO DOUG AT [facility] 

## 2017-06-30 NOTE — ED Notes (Signed)
Patient made aware of need of urine sample. States he is unable to void at this time. 

## 2017-06-30 NOTE — Consult Note (Signed)
Name: [name] MRN: [id number] DOB: [birth year]     CONSULTATION DATE: 06/30/2017  REFERRING MD : patel  CHIEF COMPLAINT: SOB    HISTORY OF PRESENT ILLNESS:  76 yo white male with increased SOB and hypoxia Patient seen for pneumonia and hypoxia in setting of DKA Patient states he is not a diabetic Patient is nonsmoker, non etoh abuse Patient is alert and awake, follows commands Patient states he feels a little better since admission Patient placed on venti mask and insulin infusion      PAST MEDICAL HISTORY :   has a past medical history of Anxiety; Arthritis; BPH (benign prostatic hypertrophy); BRBPR (bright red blood per rectum) (~ 01/2016; 10'/18/2017); CAD (coronary artery disease) (08/13/2016); Carotid artery occlusion; Cerebrovascular disease, unspecified; Cervicalgia; Chronic kidney disease (CKD), stage III (moderate); Enlarged prostate; Hypertension; Inguinal hernia without mention of obstruction or gangrene, unilateral or unspecified, (not specified as recurrent); Myocardial infarction ([place]) (2014; 2015; 2016); Other psoriasis; Pain in limb; Presence of permanent cardiac pacemaker; Situational depression; Stroke ([place]) (2010; 2014); Unspecified hemorrhoids without mention of complication; Unspecified transient cerebral ischemia; and Varicose veins.  has a past surgical history that includes Back surgery; Vasectomy; Colonoscopy; Endarterectomy (Left, 07/14/2013); Patch angioplasty (Left, 07/14/2013); Endarterectomy (Right, 10/13/2013); Carotid endarterectomy (Right, 10-13-13); Cardiac catheterization (N/A, 08/01/2016); Inguinal hernia repair (Left); Insert / replace / remove pacemaker; Lumbar disc surgery (1992); Cardiac catheterization (N/A, 06/06/2015); and Colonoscopy (N/A, 08/14/2016). Prior to Admission medications   Medication Sig Start Date End Date Taking? Authorizing Provider  amLODipine (NORVASC) 10 MG tablet Take 10 mg by mouth daily.   Yes [provider]  aspirin EC 81 MG tablet Take 1 tablet (81 mg total) by mouth daily. 11/27/16  Yes Donne Hazel, MD  atorvastatin (LIPITOR) 40 MG tablet Take 1 tablet (40 mg total) by mouth daily. 08/02/16  Yes Strader, [place] M, PA-C  finasteride (PROSCAR) 5 MG tablet Take 5 mg by mouth daily.   Yes [provider]  Glucosamine 500 MG CAPS Take 500 mg by mouth daily.   Yes [provider]  hydrochlorothiazide (HYDRODIURIL) 25 MG tablet Take 25 mg by mouth daily.   Yes [provider]  vitamin C (ASCORBIC ACID) 500 MG tablet Take 500 mg by mouth daily.   Yes [provider]  VITAMIN E PO Take 30 Units by mouth daily.   Yes [provider]   Allergies  Allergen Reactions  . Strawberry Extract Shortness Of Breath and Swelling    Lips and throat swelled  . Ace Inhibitors Other (See Comments)    Possible angioedema  . Cardura [Doxazosin Mesylate] Other (See Comments)    Erythema  . Penicillins Rash    Has patient had a PCN reaction causing immediate rash, facial/tongue/throat swelling, SOB or lightheadedness with hypotension: Yes Has patient had a PCN reaction causing severe rash involving mucus membranes or skin necrosis: No Has patient had a PCN reaction that required hospitalization No Has patient had a PCN reaction occurring within the last 10 years: No If all of the above answers are "NO", then may proceed with Cephalosporin use.    FAMILY HISTORY:  family history includes Heart attack in his father; Ovarian cancer in his daughter. SOCIAL HISTORY:  reports that he has never smoked. He has never used smokeless tobacco. He reports that he does not drink alcohol or use drugs.  REVIEW OF SYSTEMS:   Constitutional: Negative for fever, chills, weight loss, malaise/fatigue and diaphoresis.  HENT: Negative for  hearing loss, ear pain, nosebleeds, congestion, sore throat, neck pain, tinnitus and ear discharge.   Eyes: Negative for blurred vision, double  vision, photophobia, pain, discharge and redness.  Respiratory: Negative for cough, hemoptysis, sputum production, +shortness of breath, -wheezing and stridor.   Cardiovascular: Negative for chest pain, palpitations, orthopnea, claudication, leg swelling and PND.  Gastrointestinal: Negative for heartburn, nausea, vomiting, abdominal pain, diarrhea, constipation, blood in stool and melena.  Genitourinary: Negative for dysuria, urgency, frequency, hematuria and flank pain.  Musculoskeletal: Negative for myalgias, back pain, joint pain and falls.  Skin: Negative for itching and rash.  Neurological: Negative for dizziness, tingling, tremors, sensory change, speech change, focal weakness, seizures, loss of consciousness, weakness and headaches.  Endo/Heme/Allergies: Negative for environmental allergies and polydipsia. Does not bruise/bleed easily.  ALL OTHER ROS ARE NEGATIVE   VITAL SIGNS: Temp:  [98.8 F (37.1 C)-99.8 F (37.7 C)] 99.8 F (37.7 C) (09/04 1209) Pulse Rate:  [71-110] 71 (09/04 1400) Resp:  [20-33] 22 (09/04 1400) BP: (110-144)/(62-79) 122/65 (09/04 1400) SpO2:  [83 %-96 %] 92 % (09/04 1400) Weight:  [210 lb (95.3 kg)] 210 lb (95.3 kg) (09/04 0930)  Physical Examination:  GENERAL:critically ill appearing, +mild resp distress HEAD: Normocephalic, atraumatic.  EYES: Pupils equal, round, reactive to light.  No scleral icterus.  MOUTH: Moist mucosal membrane. NECK: Supple. No thyromegaly. No nodules. No JVD.  PULMONARY: +rhonchi CARDIOVASCULAR: S1 and S2. Regular rate and rhythm. No murmurs, rubs, or gallops.  GASTROINTESTINAL: Soft, nontender, -distended. No masses. Positive bowel sounds. No hepatosplenomegaly.  MUSCULOSKELETAL: No swelling, clubbing, or edema.  NEUROLOGIC: obtunded SKIN:intact,warm,dry        Recent Labs Lab 06/30/17 0932 06/30/17 1230  NA 130* 132*  K 3.2* 2.5*  CL 86* 90*  CO2 27 30  BUN 34* 33*  CREATININE 1.57* 1.63*  GLUCOSE 410* 345*      Recent Labs Lab 06/30/17 0932  HGB 13.4  HCT 38.5*  WBC 29.3*  PLT 386   Dg Chest 1 View  Result Date: 06/30/2017 CLINICAL DATA:  Cough, shortness of breath and chest pain for 10 days. Decreased oxygen saturation. EXAM: CHEST 1 VIEW COMPARISON:  Single-view of the chest 08/13/2016. FINDINGS: Airspace disease is seen in the lower lobes bilaterally and in the inferior aspect of the right upper lobe. No pneumothorax or pleural fluid. Heart size is upper normal. Pacing device is in place. No acute abnormality. IMPRESSION: Right upper and bilateral lower lobe airspace disease has an appearance most worrisome for pneumonia. Electronically Signed   By: Inge Rise [name]   On: 06/30/2017 09:54   I have Independently reviewed images of  CXR  on 06/30/2017 Interpretation:b/l lower lobe infiltrates    ASSESSMENT / PLAN:  76 yo white male with acute sepsis with acute b/l pneumonia with DKA  1.wean fio2 as needed 2.continue abx 3.insulin infusion 4.IVF's  Step down status    Patient  satisfied with Plan of action and management. All questions answered  Corrin Parker, [name]  [facility]  Medical Director [place] Director [facility] Cardio-Pulmonary Department

## 2017-06-30 NOTE — Progress Notes (Signed)
PHARMACIST - PHYSICIAN ORDER COMMUNICATION  CONCERNING: P&T Medication Policy on Herbal Medications  DESCRIPTION:  This patient's order for:  GLUCOSAMINE 500 mg  has been noted.  This product(s) is classified as an "herbal" or natural product. Due to a lack of definitive safety studies or FDA approval, nonstandard manufacturing practices, plus the potential risk of unknown drug-drug interactions while on inpatient medications, the Pharmacy and Therapeutics Committee does not permit the use of "herbal" or natural products of this type within [facility].   ACTION TAKEN: The pharmacy department is unable to verify this order at this time. Please reevaluate patient's clinical condition at discharge and address if the herbal or natural product(s) should be resumed at that time.

## 2017-06-30 NOTE — Progress Notes (Signed)
Pharmacy Antibiotic Note  [name] is a 76 y.o. male admitted on 06/30/2017 with community acquired pneumonia.  Pharmacy has been consulted for azithromycin and ceftriaxone dosing. Patient received azithromycin and ceftriaxone in the ED.    Plan: Azithromycin 250 mg daily (next dose 9/5 at 1000) x 4 days Ceftriaxone 1 g daily (next dose 9/5 at 1000) x 6 days  Height: 5\' 9"  (175.3 cm) Weight: 210 lb (95.3 kg) IBW/kg (Calculated) : 70.7  Temp (24hrs), Avg:99.3 F (37.4 C), Min:98.8 F (37.1 C), Max:99.8 F (37.7 C)   Recent Labs Lab 06/30/17 0932 06/30/17 0939  WBC 29.3*  --   CREATININE 1.57*  --   LATICACIDVEN  --  1.8    Estimated Creatinine Clearance: 44.2 mL/min (A) (by C-G formula based on SCr of 1.57 mg/dL (H)).    Allergies  Allergen Reactions  . Strawberry Extract Shortness Of Breath and Swelling    Lips and throat swelled  . Ace Inhibitors Other (See Comments)    Possible angioedema  . Cardura [Doxazosin Mesylate] Other (See Comments)    Erythema  . Penicillins Rash    Has patient had a PCN reaction causing immediate rash, facial/tongue/throat swelling, SOB or lightheadedness with hypotension: Yes Has patient had a PCN reaction causing severe rash involving mucus membranes or skin necrosis: No Has patient had a PCN reaction that required hospitalization No Has patient had a PCN reaction occurring within the last 10 years: No If all of the above answers are "NO", then may proceed with Cephalosporin use.    Antimicrobials this admission: 9/4 Azithromycin >>  9/4 Ceftriaxone >>   Dose adjustments this admission N/A   Microbiology results: 9/4 BCx: sent 9/4 MRSA PCR: pending   Thank you for allowing pharmacy to be a part of this patient's care.  Durwin Reges, PharmD Student 06/30/2017 1:15 PM

## 2017-06-30 NOTE — ED Notes (Signed)
Rolling call given to CCU.

## 2017-06-30 NOTE — ED Provider Notes (Signed)
[facility] Emergency Department Provider Note   ____________________________________________    I have reviewed the triage vital signs and the nursing notes.   HISTORY  Chief Complaint Code Sepsis     HPI [name] is a 76 y.o. male presented with complaints of productive cough and shortness of breath. Patient reports over the last several days he has had a severe cough. Today he felt markedly short of breath. Worse with exertion. Denies chest pain. Is not sure if he has had a fever. No sick contacts reported. No recent travel. Has not taken anything for this. No calf pain or swelling. He states he feels weak and has very low energy   Past Medical History:  Diagnosis Date  . Anxiety   . Arthritis    "hands" (September 04, 2016)  . BPH (benign prostatic hypertrophy)   . BRBPR (bright red blood per rectum) ~ 01/2016; 10'/18/2017  . CAD (coronary artery disease) 09-04-2016  . Carotid artery occlusion   . Cerebrovascular disease, unspecified   . Cervicalgia   . Chronic kidney disease (CKD), stage III (moderate)    [name] 11/24/2016  . Enlarged prostate   . Hypertension   . Inguinal hernia without mention of obstruction or gangrene, unilateral or unspecified, (not specified as recurrent)   . Myocardial infarction [facility]) 2014; 2015; 2016   "told I'd had the 1st 2 while in the hospital in 2016; didn't even know it" (09/04/2016)  . Other psoriasis   . Pain in limb   . Presence of permanent cardiac pacemaker   . Situational depression    "after my daughter died in 2015-01-16" (Sep 04, 2016)  . Stroke [facility]) 2010; 2014   "right sided weakness since" (11/25/2016)  . Unspecified hemorrhoids without mention of complication   . Unspecified transient cerebral ischemia   . Varicose veins     Patient Active Problem List   Diagnosis Date Noted  . Cerebrovascular accident (CVA) ([place])   . Middle cerebral artery stenosis, left   . Slurred speech 11/24/2016  .  Hypokalemia 11/24/2016  . GI bleed 2016/09/04  . BRBPR (bright red blood per rectum) 2016/09/04  . Acute kidney injury ([place]) 2016-09-04  . Leukocytosis 2016/09/04  . Coronary artery disease due to lipid rich plaque Sep 04, 2016  . CKD (chronic kidney disease) stage 2, GFR 60-89 ml/min 2016-09-04  . TIA (transient ischemic attack)   . Anxiety   . Acute GI bleeding   . Essential hypertension   . Complete heart block ([place]) 08/01/2016  . Dizziness 06/02/2015  . LBBB (left bundle branch block) 06/02/2015  . Aftercare following surgery of the circulatory system, [place] 11/10/2013  . Carotid stenosis 10/13/2013  . Occlusion and stenosis of carotid artery without mention of cerebral infarction 07/04/2013    Past Surgical History:  Procedure Laterality Date  . BACK SURGERY    . CARDIAC CATHETERIZATION N/A 06/06/2015   Procedure: Left Heart Cath and Coronary Angiography;  Surgeon: Wellington Hampshire, MD;  Location: [facility];  Service: Cardiovascular;  Laterality: N/A;  . CAROTID ENDARTERECTOMY Right 10-13-13   cea  . COLONOSCOPY    . COLONOSCOPY N/A 08/14/2016   Procedure: COLONOSCOPY;  Surgeon: Manus Gunning, MD;  Location: [facility] ENDOSCOPY;  Service: Gastroenterology;  Laterality: N/A;  . ENDARTERECTOMY Left 07/14/2013   Procedure: ENDARTERECTOMY CAROTID-LEFT;  Surgeon: Serafina Mitchell, MD;  Location: [place];  Service: Vascular;  Laterality: Left;  . ENDARTERECTOMY Right 10/13/2013   Procedure: RIGHT CAROTID ARTERY ENDARTERECTOMY WITH  VASCU GUARD PATCH ANGIOPLASTY;  Surgeon: Serafina Mitchell, MD;  Location: [place];  Service: Vascular;  Laterality: Right;  . EP IMPLANTABLE DEVICE N/A 08/01/2016   Procedure: Pacemaker Implant;  Surgeon: Will Meredith Leeds, MD;  Location: [place] CV LAB;  Service: Cardiovascular;  Laterality: N/A;  . INGUINAL HERNIA REPAIR Left   . INSERT / REPLACE / REMOVE PACEMAKER    . Hampshire   "ruptured disc"; Dr. Amado Coe  . PATCH  ANGIOPLASTY Left 07/14/2013   Procedure: PATCH ANGIOPLASTY using Vascu-Guard Vascular Patch;  Surgeon: Serafina Mitchell, MD;  Location: [place];  Service: Vascular;  Laterality: Left;  Marland Kitchen VASECTOMY      Prior to Admission medications   Medication Sig Start Date End Date Taking? Authorizing Provider  amLODipine (NORVASC) 10 MG tablet Take 10 mg by mouth daily.   Yes [provider]  aspirin EC 81 MG tablet Take 1 tablet (81 mg total) by mouth daily. 11/27/16  Yes Donne Hazel, MD  atorvastatin (LIPITOR) 40 MG tablet Take 1 tablet (40 mg total) by mouth daily. 08/02/16  Yes Strader, [place] M, PA-C  finasteride (PROSCAR) 5 MG tablet Take 5 mg by mouth daily.   Yes [provider]  Glucosamine 500 MG CAPS Take 500 mg by mouth daily.   Yes [provider]  hydrochlorothiazide (HYDRODIURIL) 25 MG tablet Take 25 mg by mouth daily.   Yes [provider]  vitamin C (ASCORBIC ACID) 500 MG tablet Take 500 mg by mouth daily.   Yes [provider]  VITAMIN E PO Take 30 Units by mouth daily.   Yes [provider]     Allergies Strawberry extract; Ace inhibitors; Cardura [doxazosin mesylate]; and Penicillins  Family History  Problem Relation Age of Onset  . Heart attack Father   . Ovarian cancer Daughter   . Colon cancer Neg Hx   . Stomach cancer Neg Hx     Social History Social History  Substance Use Topics  . Smoking status: Never Smoker  . Smokeless tobacco: Never Used  . Alcohol use No    Review of Systems  Constitutional: Diffuse weakness Eyes: No visual changes.  ENT: No sore throat. Cardiovascular: Denies chest pain. Respiratory: As above Gastrointestinal: No abdominal pain.   Genitourinary: Negative for dysuria. Musculoskeletal: Negative for back pain. Skin: Negative for rash. Neurological: Negative for headaches   ____________________________________________   PHYSICAL EXAM:  VITAL SIGNS: ED Triage Vitals  Enc  Vitals Group     BP 06/30/17 0922 110/63     Pulse Rate 06/30/17 0922 (!) 110     Resp 06/30/17 0922 (!) 22     Temp 06/30/17 0922 98.8 F (37.1 C)     Temp Source 06/30/17 0922 Oral     SpO2 06/30/17 0922 (!) 83 %     Weight 06/30/17 0930 95.3 kg (210 lb)     Height 06/30/17 0930 1.753 m (5\' 9" )     Head Circumference --      Peak Flow --      Pain Score --      Pain Loc --      Pain Edu? --      Excl. in [place]? --     Constitutional: Alert and oriented. Pleasant and interactive Eyes: Conjunctivae are normal.  Head: Atraumatic. Nose: No congestion/rhinnorhea. Mouth/Throat: Mucous membranes are moist.   Neck:  Painless ROM Cardiovascular: Tachycardia, regular rhythm. Grossly normal heart sounds.  Good peripheral  circulation. Respiratory: Increased respiratory effort with tachypnea, bibasilar rales, no wheezing Gastrointestinal: Soft and nontender. No distention.  No CVA tenderness. Genitourinary: deferred Musculoskeletal: No lower extremity tenderness nor edema.  Warm and well perfused Neurologic:  Normal speech and language. No gross focal neurologic deficits are appreciated.  Skin:  Skin is warm, dry and intact. No rash noted. Psychiatric: Mood and affect are normal. Speech and behavior are normal.  ____________________________________________   LABS (all labs ordered are listed, but only abnormal results are displayed)  Labs Reviewed  COMPREHENSIVE METABOLIC PANEL - Abnormal; Notable for the following:       Result Value   Sodium 130 (*)    Potassium 3.2 (*)    Chloride 86 (*)    Glucose, Bld 410 (*)    BUN 34 (*)    Creatinine, Ser 1.57 (*)    Calcium 7.9 (*)    Albumin 2.9 (*)    AST 64 (*)    ALT 67 (*)    Alkaline Phosphatase 136 (*)    Total Bilirubin 1.5 (*)    GFR calc non Af Amer 41 (*)    GFR calc Af Amer 47 (*)    Anion gap 17 (*)    All other components within normal limits  CBC WITH DIFFERENTIAL/PLATELET - Abnormal; Notable for the following:     WBC 29.3 (*)    RBC 4.32 (*)    HCT 38.5 (*)    Neutro Abs 26.3 (*)    Monocytes Absolute 2.0 (*)    All other components within normal limits  PROTIME-INR - Abnormal; Notable for the following:    Prothrombin Time 15.5 (*)    All other components within normal limits  CULTURE, BLOOD (ROUTINE X 2)  CULTURE, BLOOD (ROUTINE X 2)  LACTIC ACID, PLASMA  APTT  LACTIC ACID, PLASMA  URINALYSIS, COMPLETE (UACMP) WITH MICROSCOPIC  BRAIN NATRIURETIC PEPTIDE   ____________________________________________  EKG  ED ECG REPORT I, Lavonia Drafts, the attending physician, personally viewed and interpreted this ECG.  Date: 06/30/2017 Rate: 104 Rhythm: Ventricular paced rhythm QRS Axis: normal Intervals: Abnormal ST/T Wave abnormalities: Nonspecific changes   ____________________________________________  RADIOLOGY  Multi lobar pneumonia on chest x-ray ____________________________________________   PROCEDURES  Procedure(s) performed: No    Critical Care performed: yes  CRITICAL CARE Performed by: Lavonia Drafts   Total critical care time: 30 minutes  Critical care time was exclusive of separately billable procedures and treating other patients.  Critical care was necessary to treat or prevent imminent or life-threatening deterioration.  Critical care was time spent personally by me on the following activities: development of treatment plan with patient and/or surrogate as well as nursing, discussions with consultants, evaluation of patient's response to treatment, examination of patient, obtaining history from patient or surrogate, ordering and performing treatments and interventions, ordering and review of laboratory studies, ordering and review of radiographic studies, pulse oximetry and re-evaluation of patient's condition.  ____________________________________________   INITIAL IMPRESSION / ASSESSMENT AND PLAN / ED COURSE  Pertinent labs & imaging results that were  available during my care of the patient were reviewed by me and considered in my medical decision making (see chart for details).   Patient says the shortness of breath and cough, 83% on room air. We have placed him on a nonrebreather at this time we'll obtain labs chest x-ray. No severe wheezing on exam, suspect pneumonia.  ----------------------------------------- 10:03 AM on 06/30/2017 -----------------------------------------  Chest x-ray is consistent with pneumonia, I will start  broad-spectrum antibiotics, significant elevated white blood cell count. We will attempt to titrate the patient to 4-6 L nasal cannula  Patient also with apparent new onset diabetes with elevated anion gap consistent with DKA. Will start insulin drip       ____________________________________________   FINAL CLINICAL IMPRESSION(S) / ED DIAGNOSES  Final diagnoses:  Community acquired pneumonia, unspecified laterality  Sepsis, due to unspecified organism ([place])  Diabetic ketoacidosis without coma associated with type 2 diabetes mellitus ([place])      NEW MEDICATIONS STARTED DURING THIS VISIT:  New Prescriptions   No medications on file     Note:  This document was prepared using Dragon voice recognition software and may include unintentional dictation errors.    Lavonia Drafts, MD 06/30/17 1032

## 2017-06-30 NOTE — H&P (Signed)
[place] [facility] [place] NAME: [name]    MR#:  [id number]  DATE OF BIRTH:  August 20, 1941  DATE OF ADMISSION:  06/30/2017  PRIMARY CARE PHYSICIAN: Monico Blitz, MD   REQUESTING/REFERRING PHYSICIAN: Dr Corky Downs  CHIEF COMPLAINT:  Cough with increasing shortness of breath.  HISTORY OF PRESENT ILLNESS:  [name]  is a 76 y.o. male with a known history of CAD, stroke,chronic kidney disease stage III Baseline creatinine around 1.2-1.3,hypertension, BPH comes to the emergency room with increasing shortness of breath and productive cough for last 10 days. Patient's cough started with dry tics of over-the-counter medications do not get better or came to the emergency room and was found to be in acute respiratory distress with hypoxia sats 82% on room air per EMS. Patient was placed on non-rebreather. Chest shows multifocal pneumonia. Elevated white count of 30,000. Patient was also found to be in DKA this is new. Unit evaluation patient appears comfortable talking able to talk. This is. He is on 5-6 L nasal cannula oxygen with sats hanging around 92-94%. Patient is being admitted with sepsis secondary to multifocal pneumonia, acute on chronic renal failure, leukocytosis, and DKA. No family present emergency room. PAST MEDICAL HISTORY:   Past Medical History:  Diagnosis Date  . Anxiety   . Arthritis    "hands" (08/19/2016)  . BPH (benign prostatic hypertrophy)   . BRBPR (bright red blood per rectum) ~ 01/2016; 10'/18/2017  . CAD (coronary artery disease) 2016/08/19  . Carotid artery occlusion   . Cerebrovascular disease, unspecified   . Cervicalgia   . Chronic kidney disease (CKD), stage III (moderate)    [name] 11/24/2016  . Enlarged prostate   . Hypertension   . Inguinal hernia without mention of obstruction or gangrene, unilateral or unspecified, (not specified as recurrent)   . Myocardial infarction [facility]) 2014; 2015; 2016   "told I'd  had the 1st 2 while in the hospital in 2016; didn't even know it" (Aug 19, 2016)  . Other psoriasis   . Pain in limb   . Presence of permanent cardiac pacemaker   . Situational depression    "after my daughter died in 2014-12-31" (19-Aug-2016)  . Stroke [facility]) 2010; 2014   "right sided weakness since" (11/25/2016)  . Unspecified hemorrhoids without mention of complication   . Unspecified transient cerebral ischemia   . Varicose veins     PAST SURGICAL HISTOIRY:   Past Surgical History:  Procedure Laterality Date  . BACK SURGERY    . CARDIAC CATHETERIZATION N/A 06/06/2015   Procedure: Left Heart Cath and Coronary Angiography;  Surgeon: Wellington Hampshire, MD;  Location: [place] CV LAB;  Service: Cardiovascular;  Laterality: N/A;  . CAROTID ENDARTERECTOMY Right 10-13-13   cea  . COLONOSCOPY    . COLONOSCOPY N/A 08/14/2016   Procedure: COLONOSCOPY;  Surgeon: Manus Gunning, MD;  [facility] ENDOSCOPY;  Service: Gastroenterology;  Laterality: N/A;  . ENDARTERECTOMY Left 07/14/2013   Procedure: ENDARTERECTOMY CAROTID-LEFT;  Surgeon: Serafina Mitchell, MD;  Location: [place];  Service: Vascular;  Laterality: Left;  . ENDARTERECTOMY Right 10/13/2013   Procedure: RIGHT CAROTID ARTERY ENDARTERECTOMY WITH VASCU GUARD PATCH ANGIOPLASTY;  Surgeon: Serafina Mitchell, MD;  Location: [place];  Service: Vascular;  Laterality: Right;  . EP IMPLANTABLE DEVICE N/A 08/01/2016   Procedure: Pacemaker Implant;  Surgeon: Will Meredith Leeds, MD;  Location: [place] CV LAB;  Service: Cardiovascular;  Laterality: N/A;  . INGUINAL HERNIA REPAIR Left   .  INSERT / REPLACE / REMOVE PACEMAKER    . [place]   "ruptured disc"; Dr. Amado Coe  . PATCH ANGIOPLASTY Left 07/14/2013   Procedure: PATCH ANGIOPLASTY using Vascu-Guard Vascular Patch;  Surgeon: Serafina Mitchell, MD;  Location: [place];  Service: Vascular;  Laterality: Left;  Marland Kitchen VASECTOMY      SOCIAL HISTORY:   Social History  Substance Use  Topics  . Smoking status: Never Smoker  . Smokeless tobacco: Never Used  . Alcohol use No    FAMILY HISTORY:   Family History  Problem Relation Age of Onset  . Heart attack Father   . Ovarian cancer Daughter   . Colon cancer Neg Hx   . Stomach cancer Neg Hx     DRUG ALLERGIES:   Allergies  Allergen Reactions  . Strawberry Extract Shortness Of Breath and Swelling    Lips and throat swelled  . Ace Inhibitors Other (See Comments)    Possible angioedema  . Cardura [Doxazosin Mesylate] Other (See Comments)    Erythema  . Penicillins Rash    Has patient had a PCN reaction causing immediate rash, facial/tongue/throat swelling, SOB or lightheadedness with hypotension: Yes Has patient had a PCN reaction causing severe rash involving mucus membranes or skin necrosis: No Has patient had a PCN reaction that required hospitalization No Has patient had a PCN reaction occurring within the last 10 years: No If all of the above answers are "NO", then may proceed with Cephalosporin use.    REVIEW OF SYSTEMS:  Review of Systems  Constitutional: Negative for chills, fever and weight loss.  HENT: Positive for sore throat. Negative for ear discharge, ear pain and nosebleeds.   Eyes: Negative for blurred vision, pain and discharge.  Respiratory: Positive for cough, sputum production and shortness of breath. Negative for wheezing and stridor.   Cardiovascular: Negative for chest pain, palpitations, orthopnea and PND.  Gastrointestinal: Negative for abdominal pain, diarrhea, nausea and vomiting.  Genitourinary: Negative for frequency and urgency.  Musculoskeletal: Negative for back pain and joint pain.  Neurological: Positive for weakness. Negative for sensory change, speech change and focal weakness.  Psychiatric/Behavioral: Negative for depression and hallucinations. The patient is not nervous/anxious.      MEDICATIONS AT HOME:   Prior to Admission medications   Medication Sig Start Date  End Date Taking? Authorizing Provider  amLODipine (NORVASC) 10 MG tablet Take 10 mg by mouth daily.   Yes [provider]  aspirin EC 81 MG tablet Take 1 tablet (81 mg total) by mouth daily. 11/27/16  Yes Donne Hazel, MD  atorvastatin (LIPITOR) 40 MG tablet Take 1 tablet (40 mg total) by mouth daily. 08/02/16  Yes Strader, [place] M, PA-C  finasteride (PROSCAR) 5 MG tablet Take 5 mg by mouth daily.   Yes [provider]  Glucosamine 500 MG CAPS Take 500 mg by mouth daily.   Yes [provider]  hydrochlorothiazide (HYDRODIURIL) 25 MG tablet Take 25 mg by mouth daily.   Yes [provider]  vitamin C (ASCORBIC ACID) 500 MG tablet Take 500 mg by mouth daily.   Yes [provider]  VITAMIN E PO Take 30 Units by mouth daily.   Yes [provider]      VITAL SIGNS:  Blood pressure 127/79, pulse 93, temperature 98.8 F (37.1 C), temperature source Oral, resp. rate 20, height 5' 9" (1.753 m), weight 95.3 kg (210 lb), SpO2 92 %.  PHYSICAL EXAMINATION:  GENERAL:  76  y.o.-year-old patient lying in the bed with mild acute distress.  EYES: Pupils equal, round, reactive to light and accommodation. No scleral icterus. Extraocular muscles intact.  HEENT: Head atraumatic, normocephalic. Oropharynx and nasopharynx clear.  NECK:  Supple, no jugular venous distention. No thyroid enlargement, no tenderness.  LUNGS:course breath sounds bilaterally, no wheezing, rales,rhonchi or crepitation. No use of accessory muscles of respiration.  CARDIOVASCULAR: S1, S2 normal. No murmurs, rubs, or gallops. tachycardia ABDOMEN: Soft, nontender, nondistended. Bowel sounds present. No organomegaly or mass.  EXTREMITIES: No pedal edema, cyanosis, or clubbing.  NEUROLOGIC: Cranial nerves II through XII are intact. Muscle strength 5/5 in all extremities. Sensation intact. Gait not checked.  PSYCHIATRIC: The patient is alert and oriented x 3.  SKIN: No obvious rash, lesion,  or ulcer.   LABORATORY PANEL:   CBC  Recent Labs Lab 06/30/17 0932  WBC 29.3*  HGB 13.4  HCT 38.5*  PLT 386   ------------------------------------------------------------------------------------------------------------------  Chemistries   Recent Labs Lab 06/30/17 0932  NA 130*  K 3.2*  CL 86*  CO2 27  GLUCOSE 410*  BUN 34*  CREATININE 1.57*  CALCIUM 7.9*  AST 64*  ALT 67*  ALKPHOS 136*  BILITOT 1.5*   ------------------------------------------------------------------------------------------------------------------  Cardiac Enzymes No results for input(s): TROPONINI in the last 168 hours. ------------------------------------------------------------------------------------------------------------------  RADIOLOGY:  Dg Chest 1 View  Result Date: 06/30/2017 CLINICAL DATA:  Cough, shortness of breath and chest pain for 10 days. Decreased oxygen saturation. EXAM: CHEST 1 VIEW COMPARISON:  Single-view of the chest 08/13/2016. FINDINGS: Airspace disease is seen in the lower lobes bilaterally and in the inferior aspect of the right upper lobe. No pneumothorax or pleural fluid. Heart size is upper normal. Pacing device is in place. No acute abnormality. IMPRESSION: Right upper and bilateral lower lobe airspace disease has an appearance most worrisome for pneumonia. Electronically Signed   By: Inge Rise [name]   On: 06/30/2017 09:54    EKG:    IMPRESSION AND PLAN:   Creg Gilmer  is a 76 y.o. male with a known history of CAD, stroke,chronic kidney disease stage III Baseline creatinine around 1.2-1.3,hypertension, BPH comes to the emergency room with increasing shortness of breath and productive cough for last 10 days.   1. Sepsis secondary to multifocal pneumonia -Patient presented with increasing cough productive with greenish phlegm, positive chest x-ray showing multilobar pneumonia, elevated white count, tachycardia -Admit to ICU -Continue IV Rocephin and  Zithromax -Follow blood cultures sputum culture -follow-up white count -IV fluids -Lactic acid 1.8 -Case discussed with Dr. Mortimer Fries. -nebulizers around-the-clock, incentive spirometer  2. 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 -Met B every 8 hourly . 3. Leukocytosis due to #1  4. Elevated LFTs suspected due to #1 -if no improvement consider ultrasound abdomen Patient currently is asymptomatic and does not have any abdominal pain  5. Acute on chronic kidney failure stage III. Baseline creatinine 1.2-1.3 -Continue IV hydration  6. Hypokalemia -Replete  7. Hyponatremia secondary to #1 and 2 -Monitor her metabolic panel checked correct once sugars under control  8. DVT prophylaxis subcutaneous Lovenox   Patient is a full code. No family members present.  All the records are reviewed and case discussed with ED provider. Management plans discussed with the patient, family and they are in agreement.  CODE STATUS:full  TOTAL critical TIME TAKING CARE OF THIS PATIENT:55 minutes.  PATEL,SONA M.D on 06/30/2017 at 11:04 AM  Between 7am to 6pm - Pager - [phone]  After 6pm go to [email or web address] - password [email or web address] [facility]  [facility]  Office  [phone]  CC: Primary care physician; Monico Blitz, MD

## 2017-06-30 NOTE — Progress Notes (Signed)
Inpatient Diabetes Program Recommendations  AACE/ADA: New Consensus Statement on Inpatient Glycemic Control (2015)  Target Ranges:  Prepandial:   less than 140 mg/dL      Peak postprandial:   less than 180 mg/dL (1-2 hours)      Critically ill patients:  140 - 180 mg/dL   Lab Results  Component Value Date   GLUCAP 378 (H) 06/30/2017   HGBA1C 7.1 (H) 11/25/2016    Review of Glycemic Control Results for [name], [name] (MRN [id number]) as of 06/30/2017 12:21  Ref. Range 06/30/2017 10:58 06/30/2017 12:07  Glucose-Capillary Latest Ref Range: 65 - 99 mg/dL 446 (H) 378 (H)   Diabetes history: No prior hx noted ( A1c 7.1 on prior admission 11/25/16)  Inpatient Diabetes Program Recommendations:  Noted started on IV insulin drip. Will follow.  Thank you, Nani Gasser. Dasean Brow, RN, MSN, CDE  Diabetes Coordinator Inpatient Glycemic Control Team Team Pager [phone] (8am-5pm) 06/30/2017 12:28 PM

## 2017-06-30 NOTE — ED Notes (Signed)
Patient SpO2 dropped to 85% on 6L [place]. Patient placed back on non rebreather at this time. SpO2 91%.

## 2017-06-30 NOTE — Progress Notes (Signed)
[place] for electrolyte management  Allergies  Allergen Reactions  . Strawberry Extract Shortness Of Breath and Swelling    Lips and throat swelled  . Ace Inhibitors Other (See Comments)    Possible angioedema  . Cardura [Doxazosin Mesylate] Other (See Comments)    Erythema  . Penicillins Rash    Has patient had a PCN reaction causing immediate rash, facial/tongue/throat swelling, SOB or lightheadedness with hypotension: Yes Has patient had a PCN reaction causing severe rash involving mucus membranes or skin necrosis: No Has patient had a PCN reaction that required hospitalization No Has patient had a PCN reaction occurring within the last 10 years: No If all of the above answers are "NO", then may proceed with Cephalosporin use.    Patient Measurements: Height: 5\' 9"  (175.3 cm) Weight: 210 lb (95.3 kg) IBW/kg (Calculated) : 70.7  Vital Signs: Temp: 98.6 F (37 C) (09/04 2000) Temp Source: Oral (09/04 2000) BP: 134/65 (09/04 2200) Pulse Rate: 76 (09/04 2200) Intake/Output from previous day: No intake/output data recorded. Intake/Output from this shift: No intake/output data recorded.  Labs:  Recent Labs  06/30/17 0932 06/30/17 1230 06/30/17 1852  WBC 29.3*  --   --   HGB 13.4  --   --   HCT 38.5*  --   --   PLT 386  --   --   APTT 32  --   --   CREATININE 1.57* 1.63* 1.43*  MG  --  2.1  --   ALBUMIN 2.9* 2.5*  --   PROT 7.6  --   --   AST 64*  --   --   ALT 67*  --   --   ALKPHOS 136*  --   --   BILITOT 1.5*  --   --    Estimated Creatinine Clearance: 48.5 mL/min (A) (by C-G formula based on SCr of 1.43 mg/dL (H)).    Medical History: Past Medical History:  Diagnosis Date  . Anxiety   . Arthritis    "hands" (Sep 05, 2016)  . BPH (benign prostatic hypertrophy)   . BRBPR (bright red blood per rectum) ~ 01/2016; 10'/18/2017  . CAD (coronary artery disease) 2016-09-05  . Carotid artery occlusion   .  Cerebrovascular disease, unspecified   . Cervicalgia   . Chronic kidney disease (CKD), stage III (moderate)    [name] 11/24/2016  . Enlarged prostate   . Hypertension   . Inguinal hernia without mention of obstruction or gangrene, unilateral or unspecified, (not specified as recurrent)   . Myocardial infarction [facility]) 2014; 2015; 2016   "told I'd had the 1st 2 while in the hospital in 2016; didn't even know it" (2016-09-05)  . Other psoriasis   . Pain in limb   . Presence of permanent cardiac pacemaker   . Situational depression    "after my daughter died in Jan 17, 2015" (09-05-16)  . Stroke [facility]) 2010; 2014   "right sided weakness since" (11/25/2016)  . Unspecified hemorrhoids without mention of complication   . Unspecified transient cerebral ischemia   . Varicose veins      Assessment: Pharmacy consulted for electrolyte management for 76 yo male admitted to ICU for DKA and Sepsis. Patient received potassium 38mEq PO x 1 in ED.    Plan:  Will add 79mEq potassium to MIVF running at 137mL/hr. Will order potassium 65mEq IV Q1hr x 2hrs. Patient currently being transitioned off insulin drip.   Pharmacy will continue to monitor and  adjust per consult.   Simpson,Michael L 06/30/2017,10:33 PM

## 2017-06-30 NOTE — ED Notes (Signed)
Verbal order from Dr. Corky Downs to place patient on nasal cannula to maintain SpO2 88% or higher.

## 2017-06-30 NOTE — ED Notes (Signed)
Attempting to call report  x1

## 2017-06-30 NOTE — ED Triage Notes (Signed)
Pt presents to ED via POV with c/o cough, SHOB, and pain in his chest x 10 days. Pt states that he has been "coughing his head off" x 10 days, pt states coughing up greenish/white phlegm. Upon arrival to triage with this RN pt O2 saturation noted to be 82%, pt immediately brought back to room 5, Terrence Dupont, RN and Tammy, EDT-P notified and at bedside to assist. Pt placed on 6L via [place], O2 saturations 89%, pt placed on 15L via [place], pt's O2 saturations 93%. Dr. Corky Downs notified and at bedside at this time. Pt is noted to have productive cough at this time.

## 2017-07-01 LAB — BLOOD CULTURE ID PANEL (REFLEXED)
Acinetobacter baumannii: NOT DETECTED
CANDIDA KRUSEI: NOT DETECTED
CANDIDA PARAPSILOSIS: NOT DETECTED
Candida albicans: NOT DETECTED
Candida glabrata: NOT DETECTED
Candida tropicalis: NOT DETECTED
ENTEROCOCCUS SPECIES: NOT DETECTED
ESCHERICHIA COLI: NOT DETECTED
Enterobacter cloacae complex: NOT DETECTED
Enterobacteriaceae species: NOT DETECTED
HAEMOPHILUS INFLUENZAE: NOT DETECTED
Klebsiella oxytoca: NOT DETECTED
Klebsiella pneumoniae: NOT DETECTED
LISTERIA MONOCYTOGENES: NOT DETECTED
METHICILLIN RESISTANCE: NOT DETECTED
Neisseria meningitidis: NOT DETECTED
PROTEUS SPECIES: NOT DETECTED
Pseudomonas aeruginosa: NOT DETECTED
SERRATIA MARCESCENS: NOT DETECTED
STAPHYLOCOCCUS AUREUS BCID: NOT DETECTED
STAPHYLOCOCCUS SPECIES: DETECTED — AB
Streptococcus agalactiae: NOT DETECTED
Streptococcus pneumoniae: NOT DETECTED
Streptococcus pyogenes: NOT DETECTED
Streptococcus species: NOT DETECTED

## 2017-07-01 LAB — BASIC METABOLIC PANEL
ANION GAP: 9 (ref 5–15)
BUN: 27 mg/dL — ABNORMAL HIGH (ref 6–20)
CHLORIDE: 97 mmol/L — AB (ref 101–111)
CO2: 30 mmol/L (ref 22–32)
Calcium: 7.7 mg/dL — ABNORMAL LOW (ref 8.9–10.3)
Creatinine, Ser: 1.25 mg/dL — ABNORMAL HIGH (ref 0.61–1.24)
GFR calc Af Amer: >60 mL/min (ref >60)
GFR, EST NON AFRICAN AMERICAN: 53 mL/min — AB (ref >60)
GLUCOSE: 198 mg/dL — AB (ref 65–99)
POTASSIUM: 3.1 mmol/L — AB (ref 3.5–5.1)
Sodium: 136 mmol/L (ref 135–145)

## 2017-07-01 LAB — CBC
HCT: 35.1 % — ABNORMAL LOW (ref 40.0–52.0)
Hemoglobin: 12.2 g/dL — ABNORMAL LOW (ref 13.0–18.0)
MCH: 31.4 pg (ref 26.0–34.0)
MCHC: 34.6 g/dL (ref 32.0–36.0)
MCV: 90.8 fL (ref 80.0–100.0)
PLATELETS: 342 10*3/uL (ref 150–440)
RBC: 3.87 MIL/uL — AB (ref 4.40–5.90)
RDW: 13.1 % (ref 11.5–14.5)
WBC: 23.2 10*3/uL — AB (ref 3.8–10.6)

## 2017-07-01 LAB — URINALYSIS, COMPLETE (UACMP) WITH MICROSCOPIC
Bilirubin Urine: NEGATIVE
Glucose, UA: 50 mg/dL — AB
Ketones, ur: NEGATIVE mg/dL
Leukocytes, UA: NEGATIVE
Nitrite: NEGATIVE
Protein, ur: 30 mg/dL — AB
Specific Gravity, Urine: 1.017 (ref 1.005–1.030)
pH: 5 (ref 5.0–8.0)

## 2017-07-01 LAB — GLUCOSE, CAPILLARY
GLUCOSE-CAPILLARY: 190 mg/dL — AB (ref 65–99)
GLUCOSE-CAPILLARY: 201 mg/dL — AB (ref 65–99)
GLUCOSE-CAPILLARY: 324 mg/dL — AB (ref 65–99)
Glucose-Capillary: 246 mg/dL — ABNORMAL HIGH (ref 65–99)

## 2017-07-01 LAB — EXPECTORATED SPUTUM ASSESSMENT W REFEX TO RESP CULTURE

## 2017-07-01 LAB — LIPID PANEL
CHOL/HDL RATIO: 4 ratio
Cholesterol: 56 mg/dL (ref 0–200)
HDL: 14 mg/dL — ABNORMAL LOW (ref >40)
LDL Cholesterol: 26 mg/dL (ref 0–99)
Triglycerides: 82 mg/dL (ref <150)
VLDL: 16 mg/dL (ref 0–40)

## 2017-07-01 LAB — EXPECTORATED SPUTUM ASSESSMENT W GRAM STAIN, RFLX TO RESP C

## 2017-07-01 LAB — POTASSIUM: POTASSIUM: 3.2 mmol/L — AB (ref 3.5–5.1)

## 2017-07-01 LAB — MAGNESIUM: Magnesium: 2 mg/dL (ref 1.7–2.4)

## 2017-07-01 LAB — PHOSPHORUS: Phosphorus: 2 mg/dL — ABNORMAL LOW (ref 2.5–4.6)

## 2017-07-01 LAB — HEMOGLOBIN A1C
Hgb A1c MFr Bld: 8.4 % — ABNORMAL HIGH (ref 4.8–5.6)
MEAN PLASMA GLUCOSE: 194.38 mg/dL

## 2017-07-01 MED ORDER — POTASSIUM CHLORIDE CRYS ER 20 MEQ PO TBCR
40.0000 meq | EXTENDED_RELEASE_TABLET | ORAL | Status: AC
  Administered 2017-07-01 (×2): 40 meq via ORAL
  Filled 2017-07-01 (×2): qty 2

## 2017-07-01 MED ORDER — IPRATROPIUM-ALBUTEROL 0.5-2.5 (3) MG/3ML IN SOLN
3.0000 mL | RESPIRATORY_TRACT | Status: DC
  Administered 2017-07-01 – 2017-07-06 (×29): 3 mL via RESPIRATORY_TRACT
  Filled 2017-07-01 (×29): qty 3

## 2017-07-01 MED ORDER — INSULIN GLARGINE 100 UNIT/ML ~~LOC~~ SOLN
5.0000 [IU] | Freq: Every day | SUBCUTANEOUS | Status: DC
  Administered 2017-07-01 – 2017-07-03 (×3): 5 [IU] via SUBCUTANEOUS
  Filled 2017-07-01 (×4): qty 0.05

## 2017-07-01 MED ORDER — POTASSIUM PHOSPHATES 15 MMOLE/5ML IV SOLN
30.0000 mmol | Freq: Once | INTRAVENOUS | Status: AC
  Administered 2017-07-01: 30 mmol via INTRAVENOUS
  Filled 2017-07-01: qty 10

## 2017-07-01 NOTE — Consult Note (Signed)
Name: CARTEZ MOGLE MRN: [id number] DOB: 12-04-1940     CONSULTATION DATE: 06/30/2017  REFERRING MD : patel  CHIEF COMPLAINT: SOB    HISTORY OF PRESENT ILLNESS:  76 yo white male with increased SOB and hypoxia Patient seen for pneumonia and hypoxia in setting of DKA Patient states he is not a diabetic Patient is nonsmoker, non etoh abuse Patient is alert and awake, follows commands Patient states he feels a little better since admission Patient placed on venti mask and insulin infusion    REVIEW OF SYSTEMS:   Constitutional: Negative for fever, chills, weight loss, malaise/fatigue and diaphoresis.  HENT: Negative for hearing loss, ear pain, nosebleeds, congestion, sore throat, neck pain, tinnitus and ear discharge.   Eyes: Negative for blurred vision, double vision, photophobia, pain, discharge and redness.  Respiratory: Negative for cough, hemoptysis, sputum production, +shortness of breath, -wheezing and stridor.   Cardiovascular: Negative for chest pain, palpitations, orthopnea, claudication, leg swelling and PND.  Gastrointestinal: Negative for heartburn, nausea, vomiting, abdominal pain, diarrhea, constipation, blood in stool and melena.  Genitourinary: Negative for dysuria, urgency, frequency, hematuria and flank pain.  Musculoskeletal: Negative for myalgias, back pain, joint pain and falls.  Skin: Negative for itching and rash.  Neurological: Negative for dizziness, tingling, tremors, sensory change, speech change, focal weakness, seizures, loss of consciousness, weakness and headaches.  Endo/Heme/Allergies: Negative for environmental allergies and polydipsia. Does not bruise/bleed easily.  ALL OTHER ROS ARE NEGATIVE   VITAL SIGNS: Temp:  [97.7 F (36.5 C)-99.8 F (37.7 C)] 99.6 F (37.6 C) (09/05 0400) Pulse Rate:  [68-110] 75 (09/05 0700) Resp:  [20-33] 23 (09/05 0700) BP: (109-144)/(55-112) 117/68 (09/05 0700) SpO2:  [83 %-96 %] 92 % (09/05 0700) FiO2  (%):  [85 %-100 %] 85 % (09/05 0446) Weight:  [210 lb (95.3 kg)] 210 lb (95.3 kg) (09/04 0930)  Physical Examination:  GENERAL:NAD HEAD: Normocephalic, atraumatic.  EYES: Pupils equal, round, reactive to light.  No scleral icterus.  MOUTH: Moist mucosal membrane. NECK: Supple. No thyromegaly. No nodules. No JVD.  PULMONARY: +rhonchi CARDIOVASCULAR: S1 and S2. Regular rate and rhythm. No murmurs, rubs, or gallops.  GASTROINTESTINAL: Soft, nontender, -distended. No masses. Positive bowel sounds. No hepatosplenomegaly.  MUSCULOSKELETAL: No swelling, clubbing, or edema.  NEUROLOGIC:no focal deficits SKIN:intact,warm,dry        Recent Labs Lab 06/30/17 1230 06/30/17 1852 07/01/17 0547  NA 132* 136 136  K 2.5* 3.1* 3.1*  CL 90* 96* 97*  CO2 30 30 30   BUN 33* 30* 27*  CREATININE 1.63* 1.43* 1.25*  GLUCOSE 345* 167* 198*    Recent Labs Lab 06/30/17 0932 07/01/17 0547  HGB 13.4 12.2*  HCT 38.5* 35.1*  WBC 29.3* 23.2*  PLT 386 342   Dg Chest 1 View  Result Date: 06/30/2017 CLINICAL DATA:  Cough, shortness of breath and chest pain for 10 days. Decreased oxygen saturation. EXAM: CHEST 1 VIEW COMPARISON:  Single-view of the chest 08/13/2016. FINDINGS: Airspace disease is seen in the lower lobes bilaterally and in the inferior aspect of the right upper lobe. No pneumothorax or pleural fluid. Heart size is upper normal. Pacing device is in place. No acute abnormality. IMPRESSION: Right upper and bilateral lower lobe airspace disease has an appearance most worrisome for pneumonia. Electronically Signed   By: Inge Rise [name]   On: 06/30/2017 09:54   I have Independently reviewed images of  CXR  on 07/01/2017 Interpretation:b/l lower lobe infiltrates    ASSESSMENT / PLAN:  76 yo  white male with acute sepsis with acute b/l pneumonia with DKA  1.wean fio2 as needed-on high flow [place] 90% 2.continue abx 3.insulin infusion stopped 4.IVF's  Remains Step down  status    Patient  satisfied with Plan of action and management. All questions answered  Corrin Parker, [name]  [facility]  Medical Director [place] Director [facility] Cardio-Pulmonary Department

## 2017-07-01 NOTE — Progress Notes (Signed)
Pharmacy Antibiotic Note  [name] is a 76 y.o. male admitted on 06/30/2017 with community acquired pneumonia.  Pharmacy has been consulted for azithromycin and ceftriaxone dosing. Patient received azithromycin and ceftriaxone in the ED.    Plan: Azithromycin 250 mg daily (next dose 9/6 at 1000) x 3 days Ceftriaxone 1 g daily (next dose 9/6 at 1000) x 5 days  Height: 5\' 9"  (175.3 cm) Weight: 210 lb (95.3 kg) IBW/kg (Calculated) : 70.7  Temp (24hrs), Avg:98.7 F (37.1 C), Min:97.7 F (36.5 C), Max:99.6 F (37.6 C)   Recent Labs Lab 06/30/17 0932 06/30/17 0939 06/30/17 1230 06/30/17 1852 07/01/17 0547  WBC 29.3*  --   --   --  23.2*  CREATININE 1.57*  --  1.63* 1.43* 1.25*  LATICACIDVEN  --  1.8 2.2*  --   --     Estimated Creatinine Clearance: 55.5 mL/min (A) (by C-G formula based on SCr of 1.25 mg/dL (H)).    Allergies  Allergen Reactions  . Strawberry Extract Shortness Of Breath and Swelling    Lips and throat swelled  . Ace Inhibitors Other (See Comments)    Possible angioedema  . Cardura [Doxazosin Mesylate] Other (See Comments)    Erythema  . Penicillins Rash    Has patient had a PCN reaction causing immediate rash, facial/tongue/throat swelling, SOB or lightheadedness with hypotension: Yes Has patient had a PCN reaction causing severe rash involving mucus membranes or skin necrosis: No Has patient had a PCN reaction that required hospitalization No Has patient had a PCN reaction occurring within the last 10 years: No If all of the above answers are "NO", then may proceed with Cephalosporin use.    Antimicrobials this admission: 9/4 Azithromycin >>  9/4 Ceftriaxone >>   Dose adjustments this admission N/A   Microbiology results: 9/4 BCx: coag negative staph in 1 culture, no growth in other  9/4 MRSA [id number]   Thank you for allowing pharmacy to be a part of this patient's care.  Durwin Reges, PharmD Student 07/01/2017 2:49 PM

## 2017-07-01 NOTE — Care Management (Addendum)
RNCM met with patient to discuss discharge planning. He states he has recently moved in to a new place- nurse referred to it as a boarding house- patient doesn't know address. He states he is independent. "All of his family members- wife/daughter- are deceased".  He states he drives and is paying for his late daughter vehicle. He states his PCP is Dr. Humphrey Rolls in [place]([phone]) [phone]; No visit since Dec. 2017 per PCP.  He had follow up appointment: Jan/Feb 2018 referrals from [facility] hospital visits- Patient owes PCP "a lot of money".  June appointment missed/July patient cancelled appointment. Sept at [facility] patient had referral to PCP office. PCP cannot follow for home health because he has not followed up. Per PCP office patient was "living with an older woman- Jackquline Berlin [phone]" per record in Oct. 2017. Address at PCP office is same: [address] [place] [address].  I attempted to call Lelon Frohlich but there was no answer.  Emergency contact Toya Smothers [phone] is patient's sister in law but has not seen him in two years.  He has son Legrand Como in group home that has "high function mental retardation" in [place] said that she saw Legrand Como two weeks ago- and Legrand Como said that patient is living at a homeless shelter but still has car. Rose said that they never owned a home- they had a rental when wife was living.  Per Rose, they have never been able to manage money and that patient has used both of his children's money "on things we don't even know". Apparently Legrand Como helped patient get into the homeless shelter- assumed [employer].   Ann had "kicked patient out" per [employer] per [employer].  Daughter died at age 73 of ovarian cancer two years ago; wife died 19 years ago per [facility] He does not have a glucometer. He is not on Oxygen at home.  He states he has a walker available if needed. PT requested. CSW consult placed for assistance with homeless shelter.

## 2017-07-01 NOTE — Progress Notes (Signed)
[place] at [place] NAME: [name]    MR#:  [id number]  DATE OF BIRTH:  07-04-1941  SUBJECTIVE:   Patient had episode of diarrhea this morning Still shortness of breath and cough  REVIEW OF SYSTEMS:    Review of Systems  Constitutional: Negative for fever, chills weight loss Weakness generaliezed HENT: Negative for ear pain, nosebleeds, congestion, facial swelling, rhinorrhea, neck pain, neck stiffness and ear discharge.   Respiratory: ++ cough, shortness of breath, no wheezing  Cardiovascular: Negative for chest pain, palpitations and leg swelling.  Gastrointestinal: Negative for heartburn, abdominal pain, vomiting, ++diarrhea or no consitpation Genitourinary: Negative for dysuria, urgency, frequency, hematuria Musculoskeletal: Negative for back pain or joint pain Neurological: Negative for dizziness, seizures, syncope, focal weakness,  numbness and headaches.  Hematological: Does not bruise/bleed easily.  Psychiatric/Behavioral: Negative for hallucinations, confusion, dysphoric mood    Tolerating Diet:yes      DRUG ALLERGIES:   Allergies  Allergen Reactions  . Strawberry Extract Shortness Of Breath and Swelling    Lips and throat swelled  . Ace Inhibitors Other (See Comments)    Possible angioedema  . Cardura [Doxazosin Mesylate] Other (See Comments)    Erythema  . Penicillins Rash    Has patient had a PCN reaction causing immediate rash, facial/tongue/throat swelling, SOB or lightheadedness with hypotension: Yes Has patient had a PCN reaction causing severe rash involving mucus membranes or skin necrosis: No Has patient had a PCN reaction that required hospitalization No Has patient had a PCN reaction occurring within the last 10 years: No If all of the above answers are "NO", then may proceed with Cephalosporin use.    VITALS:  Blood pressure 117/68, pulse 75, temperature 99.6 F (37.6 C), temperature source  Oral, resp. rate (!) 23, height 5\' 9"  (1.753 m), weight 95.3 kg (210 lb), SpO2 92 %.  PHYSICAL EXAMINATION:  Constitutional: Appears well-developed and well-nourished. No distress. HENT: Normocephalic. Marland Kitchen Oropharynx is clear and moist.  Eyes: Conjunctivae and EOM are normal. PERRLA, no scleral icterus.  Neck: Normal ROM. Neck supple. No JVD. No tracheal deviation. CVS: RRR, S1/S2 +, no murmurs, no gallops, no carotid bruit.  Pulmonary: Effort and breath sounds normal, decreased breath sounds throughout lung fields with crackles right base Abdominal: Soft. BS +,  no distension, tenderness, rebound or guarding.  Musculoskeletal: Normal range of motion. No edema and no tenderness.  Neuro: Alert. CN 2-12 grossly intact. No focal deficits. Skin: Skin is warm and dry. No rash noted. Psychiatric: Normal mood and affect.      LABORATORY PANEL:   CBC  Recent Labs Lab 07/01/17 0547  WBC 23.2*  HGB 12.2*  HCT 35.1*  PLT 342   ------------------------------------------------------------------------------------------------------------------  Chemistries   Recent Labs Lab 06/30/17 0932  07/01/17 0547  NA 130*  < > 136  K 3.2*  < > 3.1*  CL 86*  < > 97*  CO2 27  < > 30  GLUCOSE 410*  < > 198*  BUN 34*  < > 27*  CREATININE 1.57*  < > 1.25*  CALCIUM 7.9*  < > 7.7*  MG  --   < > 2.0  AST 64*  --   --   ALT 67*  --   --   ALKPHOS 136*  --   --   BILITOT 1.5*  --   --   < > = values in this interval not displayed. ------------------------------------------------------------------------------------------------------------------  Cardiac Enzymes No results for  input(s): TROPONINI in the last 168 hours. ------------------------------------------------------------------------------------------------------------------  RADIOLOGY:  Dg Chest 1 View  Result Date: 06/30/2017 CLINICAL DATA:  Cough, shortness of breath and chest pain for 10 days. Decreased oxygen saturation. EXAM: CHEST 1  VIEW COMPARISON:  Single-view of the chest 08/13/2016. FINDINGS: Airspace disease is seen in the lower lobes bilaterally and in the inferior aspect of the right upper lobe. No pneumothorax or pleural fluid. Heart size is upper normal. Pacing device is in place. No acute abnormality. IMPRESSION: Right upper and bilateral lower lobe airspace disease has an appearance most worrisome for pneumonia. Electronically Signed   By: Inge Rise [name]   On: 06/30/2017 09:54     ASSESSMENT AND PLAN:   76 year old male with chronic kidney disease stage III, BPH and CAD who presents with cough and increasing shortness of breath and found to have sepsis due to multifocal pneumonia.  1. Sepsis due to multifocal pneumonia: Patient presents with leukocytosis and tachycardia Follow-up on final blood cultures Continue Rocephin and azithromycin  2. Acute hypoxic respiratory failure in the setting of multifocal pneumonia and now on high flow nasal cannula Weaned to oxygen nasal cannula as tolerated  3. DKA triggered by pneumonia: Patient has not been on diabetic medications at home DKA has resolved Hemoglobin A1c is 8.7 diabetes coordinator consultation requested Continue Lantus, ADA diet and sliding scale  4. BPH: Continue finasteride  5. CAD: Continue aspirin and atorvastatin  6. Hypertension: Continue Norvasc  7. Hypokalemia: Replete and recheck in a.m.  Management plans discussed with the patient and he is in agreement.  CODE STATUS: full  TOTAL TIME TAKING CARE OF THIS PATIENT: 30 minutes.     POSSIBLE D/C 2-3 days, DEPENDING ON CLINICAL CONDITION.   Tyese Finken M.D on 07/01/2017 at 8:43 AM  Between 7am to 6pm - Pager - [phone] After 6pm go to [email or web address] - password [email or web address] [facility]  Office  [phone]  CC: Primary care physician; Monico Blitz, MD  Note: This dictation was prepared with Dragon dictation along with smaller phrase technology. Any  transcriptional errors that result from this process are unintentional.

## 2017-07-01 NOTE — Progress Notes (Signed)
PT Cancellation Note  Patient Details Name: [name] MRN: [id number] DOB: [birth year]   Cancelled Treatment:    Reason Eval/Treat Not Completed: Other (comment). Consult received and chart reviewed. Pt currently refuses therapy evaluation at this time as he is very sleepy. Pt pleasant and agreeable to attempt next date. Of note, pt currently on HFNC with sats at 89% resting. Will re-attempt next date.   Dakai Braithwaite 07/01/2017, 3:54 PM  Greggory Stallion, PT, DPT [phone]

## 2017-07-01 NOTE — Progress Notes (Signed)
Decreased to 60%

## 2017-07-01 NOTE — Progress Notes (Signed)
[place] for electrolyte management  Allergies  Allergen Reactions  . Strawberry Extract Shortness Of Breath and Swelling    Lips and throat swelled  . Ace Inhibitors Other (See Comments)    Possible angioedema  . Cardura [Doxazosin Mesylate] Other (See Comments)    Erythema  . Penicillins Rash    Has patient had a PCN reaction causing immediate rash, facial/tongue/throat swelling, SOB or lightheadedness with hypotension: Yes Has patient had a PCN reaction causing severe rash involving mucus membranes or skin necrosis: No Has patient had a PCN reaction that required hospitalization No Has patient had a PCN reaction occurring within the last 10 years: No If all of the above answers are "NO", then may proceed with Cephalosporin use.    Patient Measurements: Height: 5\' 9"  (175.3 cm) Weight: 210 lb (95.3 kg) IBW/kg (Calculated) : 70.7  Vital Signs: Temp: 99.7 F (37.6 C) (09/05 1600) Temp Source: Oral (09/05 1600) BP: 137/65 (09/05 1800) Pulse Rate: 84 (09/05 1800) Intake/Output from previous day: 09/04 0701 - 09/05 0700 In: 2553.3 [I.V.:2203.3; IV Piggyback:350] Out: 1170 [Urine:1170] Intake/Output from this shift: Total I/O In: 550 [IV Piggyback:550] Out: 1100 [Urine:1100]  Labs:  Recent Labs  06/30/17 0932 06/30/17 1230 06/30/17 1852 07/01/17 0547  WBC 29.3*  --   --  23.2*  HGB 13.4  --   --  12.2*  HCT 38.5*  --   --  35.1*  PLT 386  --   --  342  APTT 32  --   --   --   CREATININE 1.57* 1.63* 1.43* 1.25*  MG  --  2.1  --  2.0  PHOS  --   --   --  2.0*  ALBUMIN 2.9* 2.5*  --   --   PROT 7.6  --   --   --   AST 64*  --   --   --   ALT 67*  --   --   --   ALKPHOS 136*  --   --   --   BILITOT 1.5*  --   --   --    Estimated Creatinine Clearance: 55.5 mL/min (A) (by C-G formula based on SCr of 1.25 mg/dL (H)).    Medical History: Past Medical History:  Diagnosis Date  . Anxiety   . Arthritis    "hands"  (09/06/16)  . BPH (benign prostatic hypertrophy)   . BRBPR (bright red blood per rectum) ~ 01/2016; 10'/18/2017  . CAD (coronary artery disease) 09-06-16  . Carotid artery occlusion   . Cerebrovascular disease, unspecified   . Cervicalgia   . Chronic kidney disease (CKD), stage III (moderate)    [name] 11/24/2016  . Enlarged prostate   . Hypertension   . Inguinal hernia without mention of obstruction or gangrene, unilateral or unspecified, (not specified as recurrent)   . Myocardial infarction [facility] 2014; 2015; 2016   "told I'd had the 1st 2 while in the hospital in 2016; didn't even know it" (September 06, 2016)  . Other psoriasis   . Pain in limb   . Presence of permanent cardiac pacemaker   . Situational depression    "after my daughter died in 2015-01-18" (2016-09-06)  . Stroke [facility]) 2010; 2014   "right sided weakness since" (11/25/2016)  . Unspecified hemorrhoids without mention of complication   . Unspecified transient cerebral ischemia   . Varicose veins      Assessment: Pharmacy consulted for electrolyte management for 76  yo male admitted to ICU for DKA, now resolved, and Sepsis. Patient ordered potassium phosphate 75mmol IV x 1 on 9/5. MIVF with potassium discontinued on am ICU rounds.    Plan:  Potassium recheck remains low. Will order additional potassium 48mEq PO Q4hr x 2 doses.   Will recheck electrolytes with am labs.   Pharmacy will continue to monitor and adjust per consult.   Britiny Defrain L 07/01/2017,6:57 PM

## 2017-07-01 NOTE — Progress Notes (Signed)
Decreased to 50%   

## 2017-07-01 NOTE — Care Management (Signed)
RNCM consult received and following. Patient is now on high flow O2 per medical record. I have requested PT evaluation related to patient's age and condition.

## 2017-07-01 NOTE — Progress Notes (Signed)
Inpatient Diabetes Program Recommendations  AACE/ADA: New Consensus Statement on Inpatient Glycemic Control (2015)  Target Ranges:  Prepandial:   less than 140 mg/dL      Peak postprandial:   less than 180 mg/dL (1-2 hours)      Critically ill patients:  140 - 180 mg/dL   Lab Results  Component Value Date   GLUCAP 324 (H) 07/01/2017   HGBA1C 8.7 (H) 06/30/2017    Review of Glycemic ControlResults for DRAEDYN, WEIDINGER (MRN [id number]) as of 07/01/2017 13:31  Ref. Range 07/01/2017 00:50 07/01/2017 07:36 07/01/2017 11:22  Glucose-Capillary Latest Ref Range: 65 - 99 mg/dL 201 (H) 190 (H) 324 (H)    Diabetes history: New onset diabetes Outpatient Diabetes medications: None Current orders for Inpatient glycemic control:  Novolog moderate tid with meals and HS, Lantus 5 units in the AM and Lantus 10 units q HS  Inpatient Diabetes Program Recommendations:    Spoke with patient regarding new diagnosis of diabetes. He reports that his wife had Type 1 diabetes and passed away 16 years ago.  He is familiar with monitoring, giving injections, and basic information on diet.  I offered to order him Living Well with Diabetes booklet, however he states that he cannot see due to cataracts on his eyes which he was recently supposed to have removed.  Note care management's note.  Will need follow-up with PCP however unsure who this will be.  He states that he hopes that he will be able to take pills for his diabetes instead of insulin.  A1C indicates that average blood sugars prior to admit were approximately 201 mg/dL. However blood sugars have increased back to the 300's at lunch time. May need the addition of Novolog meal coverage as well.     Will re-visit patient on 07/02/17 to further discuss diabetes.    Thanks, Adah Perl, RN, BC-ADM Inpatient Diabetes Coordinator Pager [phone] (8a-5p)

## 2017-07-01 NOTE — Progress Notes (Signed)
This note also relates to the following rows which could not be included: SpO2 - Cannot attach notes to unvalidated device data  Decreased to 75% on HFNC

## 2017-07-02 LAB — BASIC METABOLIC PANEL
ANION GAP: 9 (ref 5–15)
BUN: 23 mg/dL — ABNORMAL HIGH (ref 6–20)
CHLORIDE: 97 mmol/L — AB (ref 101–111)
CO2: 29 mmol/L (ref 22–32)
Calcium: 8.1 mg/dL — ABNORMAL LOW (ref 8.9–10.3)
Creatinine, Ser: 1.11 mg/dL (ref 0.61–1.24)
GFR calc non Af Amer: >60 mL/min (ref >60)
GLUCOSE: 194 mg/dL — AB (ref 65–99)
Potassium: 3.5 mmol/L (ref 3.5–5.1)
Sodium: 135 mmol/L (ref 135–145)

## 2017-07-02 LAB — CULTURE, BLOOD (ROUTINE X 2)

## 2017-07-02 LAB — GLUCOSE, CAPILLARY
GLUCOSE-CAPILLARY: 172 mg/dL — AB (ref 65–99)
Glucose-Capillary: 230 mg/dL — ABNORMAL HIGH (ref 65–99)
Glucose-Capillary: 294 mg/dL — ABNORMAL HIGH (ref 65–99)
Glucose-Capillary: 334 mg/dL — ABNORMAL HIGH (ref 65–99)

## 2017-07-02 LAB — CBC
HCT: 36.8 % — ABNORMAL LOW (ref 40.0–52.0)
HEMOGLOBIN: 12.5 g/dL — AB (ref 13.0–18.0)
MCH: 30.9 pg (ref 26.0–34.0)
MCHC: 34 g/dL (ref 32.0–36.0)
MCV: 90.7 fL (ref 80.0–100.0)
Platelets: 411 10*3/uL (ref 150–440)
RBC: 4.06 MIL/uL — AB (ref 4.40–5.90)
RDW: 13.2 % (ref 11.5–14.5)
WBC: 25.5 10*3/uL — ABNORMAL HIGH (ref 3.8–10.6)

## 2017-07-02 LAB — PHOSPHORUS: Phosphorus: 2.4 mg/dL — ABNORMAL LOW (ref 2.5–4.6)

## 2017-07-02 LAB — MAGNESIUM: Magnesium: 1.8 mg/dL (ref 1.7–2.4)

## 2017-07-02 MED ORDER — POTASSIUM CHLORIDE CRYS ER 20 MEQ PO TBCR
40.0000 meq | EXTENDED_RELEASE_TABLET | Freq: Once | ORAL | Status: AC
  Administered 2017-07-02: 40 meq via ORAL
  Filled 2017-07-02: qty 2

## 2017-07-02 NOTE — Progress Notes (Signed)
Inpatient Diabetes Program Recommendations  AACE/ADA: New Consensus Statement on Inpatient Glycemic Control (2015)  Target Ranges:  Prepandial:   less than 140 mg/dL      Peak postprandial:   less than 180 mg/dL (1-2 hours)      Critically ill patients:  140 - 180 mg/dL   Lab Results  Component Value Date   GLUCAP 294 (H) 07/02/2017   HGBA1C 8.4 (H) 07/01/2017    Review of Glycemic ControlResults for [name], [name] (MRN [id number]) as of 07/02/2017 12:40  Ref. Range 07/01/2017 16:29 07/01/2017 20:40 07/02/2017 03:35 07/02/2017 07:23 07/02/2017 11:17  Glucose-Capillary Latest Ref Range: 65 - 99 mg/dL 246 (H) 230 (H)  172 (H) 294 (H)    Diabetes history: New onset diabetes Inpatient Diabetes Program Recommendations:    Discussed patient with MD and RN.  Based on A1C, patient should be able to be d/c'd on oral agents such as Metformin and glucotrol. Both of these medications are generic and should be 4$.  Talked with patient again today regarding diabetes and plan for medications at d/c.  We discussed survival skills of diabetes including hyperglycemia, hypoglycemia, foot care, importance of follow-up with MD, monitoring, normal blood sugar values and goal A1C. Patient verbalized understanding and was able to teach back treatment of hypoglycemia.  He did ask who he was supposed to follow-up with, and I told him that this would be a part of d/c plans.  Will continue to follow.  Thanks, Adah Perl, RN, BC-ADM Inpatient Diabetes Coordinator Pager [phone] (8a-5p)

## 2017-07-02 NOTE — Consult Note (Signed)
   Name: [name] MRN: [id number] DOB: 1940/11/13     CONSULTATION DATE: 06/30/2017  REFERRING MD : patel  CHIEF COMPLAINT: SOB    HISTORY OF PRESENT ILLNESS:  Patient feels better since admission however remains on high flow nasal cannula at 75% Patient denies any progressive worsening shortness of breath Blood sugars better controlled today Patient alert and awake and following commands    REVIEW OF SYSTEMS:   Constitutional: Negative for fever, chills, weight loss, malaise/fatigue and diaphoresis.  HENT: Negative for hearing loss, ear pain, nosebleeds, congestion, sore throat, neck pain, tinnitus and ear discharge.   Eyes: Negative for blurred vision, double vision, photophobia, pain, discharge and redness.  Respiratory: Negative for cough, hemoptysis, sputum production, +shortness of breath, -wheezing and stridor.   Cardiovascular: Negative for chest pain, palpitations, orthopnea, claudication, leg swelling and PND.  Gastrointestinal: Negative for heartburn, nausea, vomiting, abdominal pain, diarrhea, constipation, blood in stool and melena.  Endo/Heme/Allergies: Negative for environmental allergies and polydipsia. Does not bruise/bleed easily.  ALL OTHER ROS ARE NEGATIVE   VITAL SIGNS: Temp:  [98.5 F (36.9 C)-99.7 F (37.6 C)] 98.5 F (36.9 C) (09/05 1945) Pulse Rate:  [72-101] 82 (09/06 0600) Resp:  [16-31] 26 (09/06 0600) BP: (104-163)/(54-102) 143/66 (09/06 0600) SpO2:  [89 %-97 %] 95 % (09/06 0714) FiO2 (%):  [50 %-75 %] 75 % (09/06 0714)  Physical Examination:  GENERAL:NAD HEAD: Normocephalic, atraumatic.  EYES: Pupils equal, round, reactive to light.  No scleral icterus.  MOUTH: Moist mucosal membrane. NECK: Supple. No thyromegaly. No nodules. No JVD.  PULMONARY: +rhonchi CARDIOVASCULAR: S1 and S2. Regular rate and rhythm. No murmurs, rubs, or gallops.  SKIN:intact,warm,dry        Recent Labs Lab 06/30/17 1852 07/01/17 0547  07/01/17 1457 07/02/17 0335  NA 136 136  --  135  K 3.1* 3.1* 3.2* 3.5  CL 96* 97*  --  97*  CO2 30 30  --  29  BUN 30* 27*  --  23*  CREATININE 1.43* 1.25*  --  1.11  GLUCOSE 167* 198*  --  194*    Recent Labs Lab 06/30/17 0932 07/01/17 0547 07/02/17 0335  HGB 13.4 12.2* 12.5*  HCT 38.5* 35.1* 36.8*  WBC 29.3* 23.2* 25.5*  PLT 386 342 411   Antibiotics Given (last 72 hours)    Date/Time Action Medication Dose Rate   06/30/17 1018 New Bag/Given   cefTRIAXone (ROCEPHIN) 1 g in dextrose 5 % 50 mL IVPB 1 g 100 mL/hr   06/30/17 1018 New Bag/Given   azithromycin (ZITHROMAX) 500 mg in dextrose 5 % 250 mL IVPB 500 mg 250 mL/hr   07/01/17 0948 New Bag/Given   cefTRIAXone (ROCEPHIN) 1 g in dextrose 5 % 50 mL IVPB 1 g 100 mL/hr   07/01/17 0948 Given   azithromycin (ZITHROMAX) tablet 250 mg 250 mg         ASSESSMENT / PLAN:  76 yo white male with acute sepsis with acute b/l pneumonia with DKA  1.wean fio2 as needed-on high flow [place] 75% 2.continue abx 3.insulin infusion stopped 4.IVF's  Remains Step down status    Patient  satisfied with Plan of action and management. All questions answered  Corrin Parker, [name]  [facility]  Medical Director [place] Director [facility] Cardio-Pulmonary Department

## 2017-07-02 NOTE — Care Management (Addendum)
Patient had a visitor named [place] per patient, however I didn't get to his room in time to meet with her. Per patient her contact is [phone] but it was a generic voice mail and the "mailbox was full".  He states she has the address of the boarding house where he lives alone. I asked him about the missed appointments with his PCP. His response was "it's too far for me to drive with these cataracts".  I asked him if he drove himself to the hospital and he said "no she did in my car"- referring to [place]. He admits that he "was at the homeless shelter but now at at boarding house".  I expressed my concern with meeting his discharge needs and needing the address. He then said, "I just don't care anymore. I want to go to [facility]".  I asked him if he was suicidal and he denied. "I'm just waiting on Him to take me home". I questioned "Him" and he was referring to "the Lord (aka God)". PT is now trying to work with patient. 1305: RNCM attempted to reach out to [place] for address; no answer.

## 2017-07-02 NOTE — Progress Notes (Signed)
[place] for electrolyte management  Allergies  Allergen Reactions  . Strawberry Extract Shortness Of Breath and Swelling    Lips and throat swelled  . Ace Inhibitors Other (See Comments)    Possible angioedema  . Cardura [Doxazosin Mesylate] Other (See Comments)    Erythema  . Penicillins Rash    Has patient had a PCN reaction causing immediate rash, facial/tongue/throat swelling, SOB or lightheadedness with hypotension: Yes Has patient had a PCN reaction causing severe rash involving mucus membranes or skin necrosis: No Has patient had a PCN reaction that required hospitalization No Has patient had a PCN reaction occurring within the last 10 years: No If all of the above answers are "NO", then may proceed with Cephalosporin use.    Patient Measurements: Height: 5\' 9"  (175.3 cm) Weight: 210 lb (95.3 kg) IBW/kg (Calculated) : 70.7  Vital Signs: BP: 127/86 (09/06 1200) Pulse Rate: 83 (09/06 1200) Intake/Output from previous day: 09/05 0701 - 09/06 0700 In: 550 [IV Piggyback:550] Out: 2150 [Urine:2150] Intake/Output from this shift: Total I/O In: -  Out: 300 [Urine:300]  Labs:  Recent Labs  06/30/17 0932 06/30/17 1230 06/30/17 1852 07/01/17 0547 07/02/17 0335  WBC 29.3*  --   --  23.2* 25.5*  HGB 13.4  --   --  12.2* 12.5*  HCT 38.5*  --   --  35.1* 36.8*  PLT 386  --   --  342 411  APTT 32  --   --   --   --   CREATININE 1.57* 1.63* 1.43* 1.25* 1.11  MG  --  2.1  --  2.0 1.8  PHOS  --   --   --  2.0* 2.4*  ALBUMIN 2.9* 2.5*  --   --   --   PROT 7.6  --   --   --   --   AST 64*  --   --   --   --   ALT 67*  --   --   --   --   ALKPHOS 136*  --   --   --   --   BILITOT 1.5*  --   --   --   --    Estimated Creatinine Clearance: 62.4 mL/min (by C-G formula based on SCr of 1.11 mg/dL).    Medical History: Past Medical History:  Diagnosis Date  . Anxiety   . Arthritis    "hands" (2016/08/25)  . BPH (benign  prostatic hypertrophy)   . BRBPR (bright red blood per rectum) ~ 01/2016; 10'/18/2017  . CAD (coronary artery disease) 08-25-2016  . Carotid artery occlusion   . Cerebrovascular disease, unspecified   . Cervicalgia   . Chronic kidney disease (CKD), stage III (moderate)    [name] 11/24/2016  . Enlarged prostate   . Hypertension   . Inguinal hernia without mention of obstruction or gangrene, unilateral or unspecified, (not specified as recurrent)   . Myocardial infarction [facility]) 2014; 2015; 2016   "told I'd had the 1st 2 while in the hospital in 2016; didn't even know it" (08/25/2016)  . Other psoriasis   . Pain in limb   . Presence of permanent cardiac pacemaker   . Situational depression    "after my daughter died in 01/06/15" (August 25, 2016)  . Stroke [facility]) 2010; 2014   "right sided weakness since" (11/25/2016)  . Unspecified hemorrhoids without mention of complication   . Unspecified transient cerebral ischemia   . Varicose  veins      Assessment: Pharmacy consulted for electrolyte management for 76 yo male admitted to ICU for DKA, now resolved, and Sepsis. Patient received potassium 86mEq PO x1 this am.   Plan:  Will order additional potassium 58mEq PO x1 and replace magnesium 2g IV x1,  Will recheck electrolytes with am labs.   Pharmacy will continue to monitor and adjust per consult.   Durwin Reges, PharmD Student 07/02/2017,1:26 PM

## 2017-07-02 NOTE — Progress Notes (Signed)
[facility] [place] NAME: [name]    MR#:  [id number]  DATE OF BIRTH:  [birth year]  SUBJECTIVE:   Patient here due to acute respiratory failure with hypoxia secondary to multifocal pneumonia. Remains on high flow nasal cannula.  REVIEW OF SYSTEMS:    Review of Systems  Constitutional: Negative for chills and fever.  HENT: Negative for congestion and tinnitus.   Eyes: Negative for blurred vision and double vision.  Respiratory: Negative for cough, shortness of breath and wheezing.   Cardiovascular: Negative for chest pain, orthopnea and PND.  Gastrointestinal: Negative for abdominal pain, diarrhea, nausea and vomiting.  Genitourinary: Negative for dysuria and hematuria.  Neurological: Negative for dizziness, sensory change and focal weakness.  All other systems reviewed and are negative.   Nutrition: heart Healthy/Carb modified.  Tolerating Diet: Yes Tolerating PT:  Await Eval.   DRUG ALLERGIES:   Allergies  Allergen Reactions  . Strawberry Extract Shortness Of Breath and Swelling    Lips and throat swelled  . Ace Inhibitors Other (See Comments)    Possible angioedema  . Cardura [Doxazosin Mesylate] Other (See Comments)    Erythema  . Penicillins Rash    Has patient had a PCN reaction causing immediate rash, facial/tongue/throat swelling, SOB or lightheadedness with hypotension: Yes Has patient had a PCN reaction causing severe rash involving mucus membranes or skin necrosis: No Has patient had a PCN reaction that required hospitalization No Has patient had a PCN reaction occurring within the last 10 years: No If all of the above answers are "NO", then may proceed with Cephalosporin use.    VITALS:  Blood pressure 127/86, pulse 83, temperature 98.5 F (36.9 C), temperature source Axillary, resp. rate (!) 30, height 5\' 9"  (1.753 m), weight 95.3 kg (210 lb), SpO2 95 %.  PHYSICAL EXAMINATION:   Physical Exam  GENERAL:   76 y.o.-year-old patient lying in bed in mild Resp. Distress.  EYES: Pupils equal, round, reactive to light and accommodation. No scleral icterus. Extraocular muscles intact.  HEENT: Head atraumatic, normocephalic. Oropharynx and nasopharynx clear.  NECK:  Supple, no jugular venous distention. No thyroid enlargement, no tenderness.  LUNGS: Poor Resp. effort, no wheezing, rales, rhonchi. + use of accessory muscles of respiration.  CARDIOVASCULAR: S1, S2 normal. No murmurs, rubs, or gallops.  ABDOMEN: Soft, nontender, nondistended. Bowel sounds present. No organomegaly or mass.  EXTREMITIES: No cyanosis, clubbing or edema b/l.    NEUROLOGIC: Cranial nerves II through XII are intact. No focal Motor or sensory deficits b/l. Globally weak   PSYCHIATRIC: The patient is alert and oriented x 3.  SKIN: No obvious rash, lesion, or ulcer.    LABORATORY PANEL:   CBC  Recent Labs Lab 07/02/17 0335  WBC 25.5*  HGB 12.5*  HCT 36.8*  PLT 411   ------------------------------------------------------------------------------------------------------------------  Chemistries   Recent Labs Lab 06/30/17 0932  07/02/17 0335  NA 130*  < > 135  K 3.2*  < > 3.5  CL 86*  < > 97*  CO2 27  < > 29  GLUCOSE 410*  < > 194*  BUN 34*  < > 23*  CREATININE 1.57*  < > 1.11  CALCIUM 7.9*  < > 8.1*  MG  --   < > 1.8  AST 64*  --   --   ALT 67*  --   --   ALKPHOS 136*  --   --   BILITOT 1.5*  --   --   < > =  values in this interval not displayed. ------------------------------------------------------------------------------------------------------------------  Cardiac Enzymes No results for input(s): TROPONINI in the last 168 hours. ------------------------------------------------------------------------------------------------------------------  RADIOLOGY:  No results found.   ASSESSMENT AND PLAN:   76 year old male with past medical history of hypertension, history of coronary artery disease, BPH,  status post pacemaker, history of previous CVA, anxiety who presented to the hospital due to shortness of breath and noted to be in acute respiratory failure with hypoxia secondary to pneumonia.  1.sepsis-secondary to multifocal pneumonia. Patient presented with leukocytosis, tachycardia and chest x-ray findings suggestive of pneumonia. -Continue IV Rocephin, Zithromax. Follow cultures which are negative so far.  2. Acute respiratory failure with hypoxia-secondary to multifocal pneumonia. -Continue IV antibiotics as mentioned above. Continue high flow nasal cannula and wean as tolerated. -Pulmonary following.  3.diabetes type 2-this is new onset for the patient. Hemoglobin A1c was close to 8. -Appreciate diabetes coordinator input patient - cont. Lantus, SSI for now.  Can likely be discharged on Oral meds due to financial reasons.  Likely metformin, Glipizide.   4. HTN - cont. Norvasc  5. Hyperlipidemia - cont. Atorvastatin  6. BPH - cont. Finasteride.   All the records are reviewed and case discussed with Care Management/Social Worker. Management plans discussed with the patient, family and they are in agreement.  CODE STATUS: Full code  DVT Prophylaxis: Lovenox  TOTAL TIME TAKING CARE OF THIS PATIENT: 30 minutes.   POSSIBLE D/C IN 2-3 DAYS, DEPENDING ON CLINICAL CONDITION.   Henreitta Leber M.D on 07/02/2017 at 3:22 PM  Between 7am to 6pm - Pager - [phone]  After 6pm go to [email or web address] - [employer] [facility]  Office  [phone]  CC: Primary care physician; Monico Blitz, MD

## 2017-07-02 NOTE — Progress Notes (Signed)
Pharmacy Antibiotic Note  [name] is a 76 y.o. male admitted on 06/30/2017 with community acquired pneumonia.  Pharmacy has been consulted for azithromycin and ceftriaxone dosing. Patient received azithromycin and ceftriaxone in the ED.    Plan: Azithromycin 250 mg daily (next dose 9/6 at 1000) x 2 days Ceftriaxone 1 g daily (next dose 9/6 at 1000) x 4 days  Height: 5\' 9"  (175.3 cm) Weight: 210 lb (95.3 kg) IBW/kg (Calculated) : 70.7  Temp (24hrs), Avg:99.1 F (37.3 C), Min:98.5 F (36.9 C), Max:99.7 F (37.6 C)   Recent Labs Lab 06/30/17 0932 06/30/17 0939 06/30/17 1230 06/30/17 1852 07/01/17 0547 07/02/17 0335  WBC 29.3*  --   --   --  23.2* 25.5*  CREATININE 1.57*  --  1.63* 1.43* 1.25* 1.11  LATICACIDVEN  --  1.8 2.2*  --   --   --     Estimated Creatinine Clearance: 62.4 mL/min (by C-G formula based on SCr of 1.11 mg/dL).    Allergies  Allergen Reactions  . Strawberry Extract Shortness Of Breath and Swelling    Lips and throat swelled  . Ace Inhibitors Other (See Comments)    Possible angioedema  . Cardura [Doxazosin Mesylate] Other (See Comments)    Erythema  . Penicillins Rash    Has patient had a PCN reaction causing immediate rash, facial/tongue/throat swelling, SOB or lightheadedness with hypotension: Yes Has patient had a PCN reaction causing severe rash involving mucus membranes or skin necrosis: No Has patient had a PCN reaction that required hospitalization No Has patient had a PCN reaction occurring within the last 10 years: No If all of the above answers are "NO", then may proceed with Cephalosporin use.    Antimicrobials this admission: 9/4 Azithromycin >>  9/4 Ceftriaxone >>   Dose adjustments this admission N/A   Microbiology results: 9/4 BCx: Coag negative staph in 1 culture, no growth in other  9/4 MRSA [id number]  9/4 Sputum: few budding yeast, rare gram variable rods   Thank you for allowing pharmacy to be a part of this  patient's care.  Durwin Reges, PharmD Student 07/02/2017 1:13 PM

## 2017-07-02 NOTE — Evaluation (Signed)
Physical Therapy Evaluation Patient Details Name: [name] MRN: [id number] DOB: 1941/02/14 Today's Date: 07/02/2017   History of Present Illness  Pt admitted for sepsis secondary to pneumonia. Pt with complaints of cough and SOB symptoms x 10 days. Pt with history of CAD and CKD. Currently on HFNC at this time.  Clinical Impression  Pt is a pleasant 76 year old male who was admitted for sepsis secondary to pneumonia. Pt performs bed mobility/transfers with min assist and HHA and is unable to ambulate at this time secondary to fatigue/weakness. Pt demonstrates deficits with strength/endurance/mobility. Pt on HFNC with sats at 88% at this time. Good endurance with supine there-ex and appears motivated to perform therapy. Would benefit from skilled PT to address above deficits and promote optimal return to PLOF; recommend transition to STR upon discharge from acute hospitalization.       Follow Up Recommendations SNF    Equipment Recommendations  Rolling walker with 5" wheels    Recommendations for Other Services       Precautions / Restrictions Precautions Precautions: Fall Restrictions Weight Bearing Restrictions: No      Mobility  Bed Mobility Overal bed mobility: Needs Assistance Bed Mobility: Supine to Sit     Supine to sit: Min assist     General bed mobility comments: assist for sliding to EOB, guidance with B LE. Once seated, takes a few seconds to sit upright without therapist assist  Transfers Overall transfer level: Needs assistance Equipment used: 1 person hand held assist Transfers: Sit to/from Stand Sit to Stand: Min assist         General transfer comment: Several LOB noted during standing with post leaning noted. RW in room, however not used at this time. HHA used. Able to self correct LOB, keeps backs of B legs against bed rail. Fatigues after standing approx 3 minutes for change of bed linen. On HFNC at [facility] with O2 sats at 88%.    Ambulation/Gait             General Gait Details: unable secondary to fatigue and safety  Stairs            Wheelchair Mobility    Modified Rankin (Stroke Patients Only)       Balance Overall balance assessment: Needs assistance Sitting-balance support: Feet supported Sitting balance-Leahy Scale: Good     Standing balance support: Single extremity supported Standing balance-Leahy Scale: Fair                               Pertinent Vitals/Pain Pain Assessment: No/denies pain    Home Living Family/patient expects to be discharged to:: Group home (like in boarding home)                 Additional Comments: has flight of stairs to enter with rail on R side and then 3 additional steps to enter home without railing    Prior Function Level of Independence: Independent         Comments: reports last fall approx 3 years ago, very indep prior to admission     Hand Dominance        Extremity/Trunk Assessment   Upper Extremity Assessment Upper Extremity Assessment: Overall WFL for tasks assessed    Lower Extremity Assessment Lower Extremity Assessment: Generalized weakness (B LE grossly 4/5)       Communication   Communication: No difficulties  Cognition Arousal/Alertness: Awake/alert Behavior During Therapy: [facility]  for tasks assessed/performed Overall Cognitive Status: Within Functional Limits for tasks assessed                                        General Comments      Exercises Other Exercises Other Exercises: supine ther-ex performed including B LE SLRs, hip abd/add, ankle pumps, and quad sets. All ther-ex performed x 10 reps with cues for correct technique   Assessment/Plan    PT Assessment Patient needs continued PT services  PT Problem List Decreased strength;Decreased activity tolerance;Decreased balance;Decreased mobility;Decreased safety awareness       PT Treatment Interventions Gait  training;DME instruction;Therapeutic activities;Therapeutic exercise    PT Goals (Current goals can be found in the Care Plan section)  Acute Rehab PT Goals Patient Stated Goal: to get stronger PT Goal Formulation: With patient Time For Goal Achievement: 07/16/17 Potential to Achieve Goals: Good    Frequency Min 2X/week   Barriers to discharge        Co-evaluation               AM-PAC PT "6 Clicks" Daily Activity  Outcome Measure Difficulty turning over in bed (including adjusting bedclothes, sheets and blankets)?: Unable Difficulty moving from lying on back to sitting on the side of the bed? : Unable Difficulty sitting down on and standing up from a chair with arms (e.g., wheelchair, bedside commode, etc,.)?: Unable Help needed moving to and from a bed to chair (including a wheelchair)?: A Little Help needed walking in hospital room?: A Lot Help needed climbing 3-5 steps with a railing? : Total 6 Click Score: 9    End of Session Equipment Utilized During Treatment: Gait belt;Oxygen Activity Tolerance: Patient limited by fatigue Patient left: in bed Nurse Communication: Mobility status PT Visit Diagnosis: Unsteadiness on feet (R26.81);Other abnormalities of gait and mobility (R26.89);Difficulty in walking, not elsewhere classified (R26.2);Muscle weakness (generalized) (M62.81)    Time: 3734-2876 PT Time Calculation (min) (ACUTE ONLY): 21 min   Charges:   PT Evaluation $PT Eval Moderate Complexity: 1 Mod PT Treatments $Therapeutic Exercise: 8-22 mins   PT G Codes:   PT G-Codes **NOT FOR INPATIENT CLASS** Functional Assessment Tool Used: AM-PAC 6 Clicks Basic Mobility Functional Limitation: Mobility: Walking and moving around Mobility: Walking and Moving Around Current Status (O1157): At least 60 percent but less than 80 percent impaired, limited or restricted Mobility: Walking and Moving Around Goal Status [phone]): At least 40 percent but less than 60 percent  impaired, limited or restricted    Greggory Stallion, PT, DPT [phone]   Ebunoluwa Gernert 07/02/2017, 3:35 PM

## 2017-07-03 ENCOUNTER — Inpatient Hospital Stay: Payer: Medicare Other

## 2017-07-03 ENCOUNTER — Encounter: Payer: Self-pay | Admitting: Radiology

## 2017-07-03 LAB — BASIC METABOLIC PANEL
ANION GAP: 7 (ref 5–15)
BUN: 23 mg/dL — ABNORMAL HIGH (ref 6–20)
CO2: 30 mmol/L (ref 22–32)
Calcium: 8.2 mg/dL — ABNORMAL LOW (ref 8.9–10.3)
Chloride: 98 mmol/L — ABNORMAL LOW (ref 101–111)
Creatinine, Ser: 1.11 mg/dL (ref 0.61–1.24)
GFR calc non Af Amer: >60 mL/min (ref >60)
Glucose, Bld: 171 mg/dL — ABNORMAL HIGH (ref 65–99)
Potassium: 3.7 mmol/L (ref 3.5–5.1)
SODIUM: 135 mmol/L (ref 135–145)

## 2017-07-03 LAB — GLUCOSE, CAPILLARY
GLUCOSE-CAPILLARY: 160 mg/dL — AB (ref 65–99)
GLUCOSE-CAPILLARY: 210 mg/dL — AB (ref 65–99)
GLUCOSE-CAPILLARY: 219 mg/dL — AB (ref 65–99)
Glucose-Capillary: 199 mg/dL — ABNORMAL HIGH (ref 65–99)

## 2017-07-03 MED ORDER — IOPAMIDOL (ISOVUE-370) INJECTION 76%
75.0000 mL | Freq: Once | INTRAVENOUS | Status: AC | PRN
  Administered 2017-07-03: 75 mL via INTRAVENOUS

## 2017-07-03 NOTE — Progress Notes (Signed)
Pharmacy Antibiotic Note  [name] is a 76 y.o. male admitted on 06/30/2017 with community acquired pneumonia.  Pharmacy has been consulted for azithromycin and ceftriaxone dosing. Patient received azithromycin and ceftriaxone in the ED.    Plan: Azithromycin 250 mg daily (next dose 9/6 at 1000) x 1 day Ceftriaxone 1 g daily (next dose 9/6 at 1000) x 3 days  Height: 5\' 9"  (175.3 cm) Weight: 210 lb (95.3 kg) IBW/kg (Calculated) : 70.7  Temp (24hrs), Avg:99.5 F (37.5 C), Min:99 F (37.2 C), Max:101.2 F (38.4 C)   Recent Labs Lab 06/30/17 0932 06/30/17 0939 06/30/17 1230 06/30/17 1852 07/01/17 0547 07/02/17 0335 07/03/17 0511  WBC 29.3*  --   --   --  23.2* 25.5*  --   CREATININE 1.57*  --  1.63* 1.43* 1.25* 1.11 1.11  LATICACIDVEN  --  1.8 2.2*  --   --   --   --     Estimated Creatinine Clearance: 62.4 mL/min (by C-G formula based on SCr of 1.11 mg/dL).    Allergies  Allergen Reactions  . Strawberry Extract Shortness Of Breath and Swelling    Lips and throat swelled  . Ace Inhibitors Other (See Comments)    Possible angioedema  . Cardura [Doxazosin Mesylate] Other (See Comments)    Erythema  . Penicillins Rash    Has patient had a PCN reaction causing immediate rash, facial/tongue/throat swelling, SOB or lightheadedness with hypotension: Yes Has patient had a PCN reaction causing severe rash involving mucus membranes or skin necrosis: No Has patient had a PCN reaction that required hospitalization No Has patient had a PCN reaction occurring within the last 10 years: No If all of the above answers are "NO", then may proceed with Cephalosporin use.    Antimicrobials this admission: 9/4 Azithromycin >>  9/4 Ceftriaxone >>   Dose adjustments this admission N/A   Microbiology results: 9/4 BCx: Coag negative staph in 1 culture, no growth in other  9/4 MRSA MEB:[id number]  9/4 Sputum: few budding yeast, rare gram variable rods   Thank you for allowing  pharmacy to be a part of this patient's care.  Durwin Reges, PharmD Student 07/03/2017 11:19 AM

## 2017-07-03 NOTE — Progress Notes (Signed)
[name] at [place] NAME: [name]    MR#:  [id number]  DATE OF BIRTH:  [birth year]  SUBJECTIVE:   Patient here due to acute respiratory failure with hypoxia secondary to multifocal pneumonia. Remains on high flow nasal cannula.  CT chest today showing no PE but dense multi-lobar pneumonia.  + cough  REVIEW OF SYSTEMS:    Review of Systems  Constitutional: Negative for chills and fever.  HENT: Negative for congestion and tinnitus.   Eyes: Negative for blurred vision and double vision.  Respiratory: Positive for cough and shortness of breath. Negative for wheezing.   Cardiovascular: Negative for chest pain, orthopnea and PND.  Gastrointestinal: Negative for abdominal pain, diarrhea, nausea and vomiting.  Genitourinary: Negative for dysuria and hematuria.  Neurological: Negative for dizziness, sensory change and focal weakness.  All other systems reviewed and are negative.   Nutrition: heart Healthy/Carb modified.  Tolerating Diet: Yes Tolerating PT:  Eval noted.   DRUG ALLERGIES:   Allergies  Allergen Reactions  . Strawberry Extract Shortness Of Breath and Swelling    Lips and throat swelled  . Ace Inhibitors Other (See Comments)    Possible angioedema  . Cardura [Doxazosin Mesylate] Other (See Comments)    Erythema  . Penicillins Rash    Has patient had a PCN reaction causing immediate rash, facial/tongue/throat swelling, SOB or lightheadedness with hypotension: Yes Has patient had a PCN reaction causing severe rash involving mucus membranes or skin necrosis: No Has patient had a PCN reaction that required hospitalization No Has patient had a PCN reaction occurring within the last 10 years: No If all of the above answers are "NO", then may proceed with Cephalosporin use.    VITALS:  Blood pressure 133/68, pulse 79, temperature 99 F (37.2 C), temperature source Oral, resp. rate (!) 22, height 5\' 9"  (1.753 m), weight 95.3  kg (210 lb), SpO2 91 %.  PHYSICAL EXAMINATION:   Physical Exam  GENERAL:  76 y.o.-year-old patient lying in bed in mild Resp. Distress.  EYES: Pupils equal, round, reactive to light and accommodation. No scleral icterus. Extraocular muscles intact.  HEENT: Head atraumatic, normocephalic. Oropharynx and nasopharynx clear.  NECK:  Supple, no jugular venous distention. No thyroid enlargement, no tenderness.  LUNGS: Poor Resp. effort, no wheezing, rales, rhonchi. + use of accessory muscles of respiration.  CARDIOVASCULAR: S1, S2 normal. No murmurs, rubs, or gallops.  ABDOMEN: Soft, nontender, nondistended. Bowel sounds present. No organomegaly or mass.  EXTREMITIES: No cyanosis, clubbing or edema b/l.    NEUROLOGIC: Cranial nerves II through XII are intact. No focal Motor or sensory deficits b/l. Globally weak   PSYCHIATRIC: The patient is alert and oriented x 3.  SKIN: No obvious rash, lesion, or ulcer.    LABORATORY PANEL:   CBC  Recent Labs Lab 07/02/17 0335  WBC 25.5*  HGB 12.5*  HCT 36.8*  PLT 411   ------------------------------------------------------------------------------------------------------------------  Chemistries   Recent Labs Lab 06/30/17 0932  07/02/17 0335 07/03/17 0511  NA 130*  < > 135 135  K 3.2*  < > 3.5 3.7  CL 86*  < > 97* 98*  CO2 27  < > 29 30  GLUCOSE 410*  < > 194* 171*  BUN 34*  < > 23* 23*  CREATININE 1.57*  < > 1.11 1.11  CALCIUM 7.9*  < > 8.1* 8.2*  MG  --   < > 1.8  --   AST 64*  --   --   --  ALT 67*  --   --   --   ALKPHOS 136*  --   --   --   BILITOT 1.5*  --   --   --   < > = values in this interval not displayed. ------------------------------------------------------------------------------------------------------------------  Cardiac Enzymes No results for input(s): TROPONINI in the last 168  hours. ------------------------------------------------------------------------------------------------------------------  RADIOLOGY:  Ct Angio Chest Pe W Or Wo Contrast  Result Date: 07/03/2017 CLINICAL DATA:  Shortness of breath and cough. Being treated for pneumonia but not responding to treatment. EXAM: CT ANGIOGRAPHY CHEST WITH CONTRAST TECHNIQUE: Multidetector CT imaging of the chest was performed using the standard protocol during bolus administration of intravenous contrast. Multiplanar CT image reconstructions and MIPs were obtained to evaluate the vascular anatomy. CONTRAST:  75 cc Isovue 370 COMPARISON:  Chest radiography 06/30/2017 FINDINGS: Cardiovascular: Pulmonary arterial opacification is excellent. There are no pulmonary emboli. There is aortic atherosclerosis with unfolding of the aorta but no aneurysm or dissection. Coronary artery calcification is present. Mediastinum/Nodes: Mild reactive prominence of the hilar and mediastinal lymph nodes without dominant lymphadenopathy. Lungs/Pleura: Consolidation and collapse of the right lower lobe, segmental involvement of the posterior right upper lobe, and involvement of the left lower lobe. Left upper lobe remains clear. Right middle lobe remains clear. No evidence of cavitation. Only a tiny amount of free flowing pleural fluid bilaterally. Upper Abdomen: Negative Musculoskeletal: Ordinary spinal degenerative changes. Review of the MIP images confirms the above findings. IMPRESSION: Consolidative pneumonia of both lower lobes with collapse. Segmental involvement of the right upper lobe. Left upper lobe and right middle lobe or clear. Tiny amount of free flowing pleural fluid. No pulmonary emboli. Aortic atherosclerosis. Coronary artery calcification. Electronically Signed   By: Nelson Chimes [name]   On: 07/03/2017 11:26     ASSESSMENT AND PLAN:   76 year old male with past medical history of hypertension, history of coronary artery disease, BPH,  status post pacemaker, history of previous CVA, anxiety who presented to the hospital due to shortness of breath and noted to be in acute respiratory failure with hypoxia secondary to pneumonia.  1.sepsis-secondary to multifocal pneumonia. Patient presented with leukocytosis, tachycardia and chest x-ray findings suggestive of pneumonia. -Continue IV Rocephin, Zithromax. Follow cultures which are negative so far.  2. Acute respiratory failure with hypoxia-secondary to multifocal pneumonia. - CT chest showing dense multi-lobar pneumonia and no PE today.  -Continue IV antibiotics as mentioned above. Continue high flow nasal cannula and wean as tolerated. -Pulmonary following and appreciate input.  Cont. duonebs  3.diabetes type 2-this is new onset for the patient. Hemoglobin A1c was close to 8. -Appreciate diabetes coordinator input patient - cont. Lantus, SSI for now.  Can likely be discharged on Oral meds due to financial reasons.  Likely metformin, Glipizide.   4. HTN - cont. Norvasc  5. Hyperlipidemia - cont. Atorvastatin  6. BPH - cont. Finasteride.   Seen by PT and they recommend SNF/STR and will discharge there when improved from Resp. Standpoint.    All the records are reviewed and case discussed with Care Management/Social Worker. Management plans discussed with the patient, family and they are in agreement.  CODE STATUS: Full code  DVT Prophylaxis: Lovenox  TOTAL TIME TAKING CARE OF THIS PATIENT: 25 minutes.   POSSIBLE D/C IN 2-3 DAYS, DEPENDING ON CLINICAL CONDITION.   Henreitta Leber M.D on 07/03/2017 at 3:51 PM  Between 7am to 6pm - Pager - [phone]  After 6pm go to [email or web address] - [place]  [facility]  Office  [phone]  CC: Primary care physician; Monico Blitz, MD

## 2017-07-03 NOTE — NC FL2 (Signed)
[place] LEVEL OF CARE SCREENING TOOL     IDENTIFICATION  Patient Name: [name] Birthdate: [birth year] Sex: male Admission Date (Current Location): 06/30/2017  [place] and [place] Number:  Engineering geologist and Address:  [facility] [address], [address], [place],  [address]      Provider Number: [id number]  Attending Physician Name and Address:  Henreitta Leber, MD  Relative Name and Phone Number:       Current Level of Care: Hospital Recommended Level of Care: [place] Prior Approval Number:    Date Approved/Denied:   PASRR Number: [id number]  Discharge Plan: SNF    Current Diagnoses: Patient Active Problem List   Diagnosis Date Noted  . Sepsis ([place]) 06/30/2017  . Community acquired pneumonia   . Diabetic ketoacidosis without coma associated with type 2 diabetes mellitus ([place])   . Cerebrovascular accident (CVA) ([place])   . Middle cerebral artery stenosis, left   . Slurred speech 11/24/2016  . Hypokalemia 11/24/2016  . GI bleed 08/13/2016  . BRBPR (bright red blood per rectum) 08/13/2016  . Acute kidney injury ([place]) 08/13/2016  . Leukocytosis 08/13/2016  . Coronary artery disease due to lipid rich plaque 08/13/2016  . CKD (chronic kidney disease) stage 2, GFR 60-89 ml/min 08/13/2016  . TIA (transient ischemic attack)   . Anxiety   . Acute GI bleeding   . Essential hypertension   . Complete heart block ([place]) 08/01/2016  . Dizziness 06/02/2015  . LBBB (left bundle branch block) 06/02/2015  . Aftercare following surgery of the circulatory system, [place] 11/10/2013  . Carotid stenosis 10/13/2013  . Occlusion and stenosis of carotid artery without mention of cerebral infarction 07/04/2013    Orientation RESPIRATION BLADDER Height & Weight     Self, Time, Situation, Place  Normal, O2 Continent Weight: 210 lb (95.3 kg) Height:  5\' 9"  (175.3 cm)  BEHAVIORAL SYMPTOMS/MOOD NEUROLOGICAL BOWEL  NUTRITION STATUS   (none)  (none) Incontinent Diet (heart healthy; carb modified)  AMBULATORY STATUS COMMUNICATION OF NEEDS Skin   Limited Assist Verbally Normal                       Personal Care Assistance Level of Assistance  Bathing, Dressing Bathing Assistance: Limited assistance   Dressing Assistance: Limited assistance     Functional Limitations Info   (none)          SPECIAL CARE FACTORS FREQUENCY  PT (By licensed PT)                    Contractures Contractures Info: Not present    Additional Factors Info  Code Status Code Status Info: full             Current Medications (07/03/2017):  This is the current hospital active medication list Current Facility-Administered Medications  Medication Dose Route Frequency Provider Last Rate Last Dose  . acetaminophen (TYLENOL) tablet 650 mg  650 mg Oral Q6H PRN Fritzi Mandes, MD   650 mg at 07/02/17 1939   Or  . acetaminophen (TYLENOL) suppository 650 mg  650 mg Rectal Q6H PRN Fritzi Mandes, MD      . albuterol (PROVENTIL) (2.5 MG/3ML) 0.083% nebulizer solution 2.5 mg  2.5 mg Nebulization Q6H PRN Fritzi Mandes, MD      . amLODipine (NORVASC) tablet 10 mg  10 mg Oral Daily Fritzi Mandes, MD   10 mg at 07/03/17 0827  . aspirin EC tablet 81  mg  81 mg Oral Daily Fritzi Mandes, MD   81 mg at 07/03/17 0827  . atorvastatin (LIPITOR) tablet 40 mg  40 mg Oral q1800 Fritzi Mandes, MD   40 mg at 07/02/17 1809  . azithromycin (ZITHROMAX) tablet 250 mg  250 mg Oral Daily Fritzi Mandes, MD   250 mg at 07/03/17 3825  . benzonatate (TESSALON) capsule 100 mg  100 mg Oral TID PRN Fritzi Mandes, MD   100 mg at 07/03/17 1311  . cefTRIAXone (ROCEPHIN) 1 g in dextrose 5 % 50 mL IVPB  1 g Intravenous Q24H Fritzi Mandes, MD   Stopped at 07/03/17 0539  . enoxaparin (LOVENOX) injection 40 mg  40 mg Subcutaneous Q24H Fritzi Mandes, MD   40 mg at 07/02/17 1936  . finasteride (PROSCAR) tablet 5 mg  5 mg Oral Daily Fritzi Mandes, MD   5 mg at 07/03/17 0827  .  guaiFENesin (MUCINEX) 12 hr tablet 600 mg  600 mg Oral BID Fritzi Mandes, MD   600 mg at 07/03/17 1000  . guaiFENesin-dextromethorphan (ROBITUSSIN DM) 100-10 MG/5ML syrup 5 mL  5 mL Oral Q8H PRN Varughese, Bincy S, NP   5 mL at 07/01/17 0042  . insulin aspart (novoLOG) injection 0-15 Units  0-15 Units Subcutaneous TID WC Varughese, Bincy S, NP   5 Units at 07/03/17 1312  . insulin glargine (LANTUS) injection 10 Units  10 Units Subcutaneous Q24H Varughese, Bincy S, NP   10 Units at 07/02/17 2110  . insulin glargine (LANTUS) injection 5 Units  5 Units Subcutaneous Daily Flora Lipps, MD   5 Units at 07/03/17 0827  . ipratropium-albuterol (DUONEB) 0.5-2.5 (3) MG/3ML nebulizer solution 3 mL  3 mL Nebulization Q4H Flora Lipps, MD   3 mL at 07/03/17 1143  . ondansetron (ZOFRAN) tablet 4 mg  4 mg Oral Q6H PRN Fritzi Mandes, MD       Or  . ondansetron [facility]) injection 4 mg  4 mg Intravenous Q6H PRN Fritzi Mandes, MD      . polyethylene glycol (MIRALAX / GLYCOLAX) packet 17 g  17 g Oral Daily PRN Fritzi Mandes, MD      . traMADol Veat[facility] [place]) tablet 50 mg  50 mg Oral Q6H PRN Fritzi Mandes, MD      . vitamin C (ASCORBIC ACID) tablet 500 mg  500 mg Oral Daily Fritzi Mandes, MD   500 mg at 07/03/17 0827  . vitamin e (AQUASOL E) solution 30 Units  30 Units Oral Daily Fritzi Mandes, MD   30 Units at 07/03/17 0827     Discharge Medications: Please see discharge summary for a list of discharge medications.  Relevant Imaging Results:  Relevant Lab Results:   Additional Information ss: [id number]  Shela Leff, LCSW

## 2017-07-03 NOTE — Progress Notes (Signed)
[facility] for electrolyte management  Allergies  Allergen Reactions  . Strawberry Extract Shortness Of Breath and Swelling    Lips and throat swelled  . Ace Inhibitors Other (See Comments)    Possible angioedema  . Cardura [Doxazosin Mesylate] Other (See Comments)    Erythema  . Penicillins Rash    Has patient had a PCN reaction causing immediate rash, facial/tongue/throat swelling, SOB or lightheadedness with hypotension: Yes Has patient had a PCN reaction causing severe rash involving mucus membranes or skin necrosis: No Has patient had a PCN reaction that required hospitalization No Has patient had a PCN reaction occurring within the last 10 years: No If all of the above answers are "NO", then may proceed with Cephalosporin use.    Patient Measurements: Height: 5\' 9"  (175.3 cm) Weight: 210 lb (95.3 kg) IBW/kg (Calculated) : 70.7  Vital Signs: Temp: 99.1 F (37.3 C) (09/07 0813) Temp Source: Oral (09/07 0813) BP: 133/68 (09/07 0300) Pulse Rate: 79 (09/07 0300) Intake/Output from previous day: 09/06 0701 - 09/07 0700 In: -  Out: 575 [Urine:575] Intake/Output from this shift: Total I/O In: 340 [P.O.:240; IV Piggyback:100] Out: 300 [Urine:300]  Labs:  Recent Labs  06/30/17 1230  07/01/17 0547 07/02/17 0335 07/03/17 0511  WBC  --   --  23.2* 25.5*  --   HGB  --   --  12.2* 12.5*  --   HCT  --   --  35.1* 36.8*  --   PLT  --   --  342 411  --   CREATININE 1.63*  < > 1.25* 1.11 1.11  MG 2.1  --  2.0 1.8  --   PHOS  --   --  2.0* 2.4*  --   ALBUMIN 2.5*  --   --   --   --   < > = values in this interval not displayed. Estimated Creatinine Clearance: 62.4 mL/min (by C-G formula based on SCr of 1.11 mg/dL).    Medical History: Past Medical History:  Diagnosis Date  . Anxiety   . Arthritis    "hands" (08/20/2016)  . BPH (benign prostatic hypertrophy)   . BRBPR (bright red blood per rectum) ~ 01/2016; 10'/18/2017  .  CAD (coronary artery disease) 08/20/16  . Carotid artery occlusion   . Cerebrovascular disease, unspecified   . Cervicalgia   . CHF (congestive heart failure) ([place])   . Chronic kidney disease (CKD), stage III (moderate)    [name] 11/24/2016  . Enlarged prostate   . Hypertension   . Inguinal hernia without mention of obstruction or gangrene, unilateral or unspecified, (not specified as recurrent)   . Myocardial infarction [facility]) 2014; 2015; 2016   "told I'd had the 1st 2 while in the hospital in 2016; didn't even know it" (08/20/16)  . Other psoriasis   . Pain in limb   . Presence of permanent cardiac pacemaker   . Situational depression    "after my daughter died in 01-01-15" (08-20-2016)  . Stroke [facility]) 2010; 2014   "right sided weakness since" (11/25/2016)  . Unspecified hemorrhoids without mention of complication   . Unspecified transient cerebral ischemia   . Varicose veins      Assessment: Pharmacy consulted for electrolyte management for 76 yo male admitted to ICU for DKA, now resolved, and Sepsis. Patient received potassium 64mEq PO x1 this am.   Plan:  Based on labs no electrolyte replacement warranted at this time.   Will  recheck electrolytes with 9/9 am labs.   Pharmacy will continue to monitor and adjust per consult.   Durwin Reges, PharmD Student 07/03/2017,11:15 AM

## 2017-07-03 NOTE — Progress Notes (Signed)
Arvil Chaco, NP notified of [place].  Asymptomatic.

## 2017-07-03 NOTE — Clinical Social Work Note (Signed)
Clinical Social Work Assessment  Patient Details  Name: [name] MRN: [id number] Date of Birth: 02-15-1941  Date of referral:  07/03/17               Reason for consult:  Facility Placement, Discharge Planning                Permission sought to share information with:  Facility Art therapist granted to share information::  Yes, Verbal Permission Granted  Name::        Agency::     Relationship::     Contact Information:     Housing/Transportation Living arrangements for the past 2 months:  International Paper of Information:  Patient Patient Interpreter Needed:  None Criminal Activity/Legal Involvement Pertinent to Current Situation/Hospitalization:  No - Comment as needed Significant Relationships:  Adult Children Lives with:  Self Do you feel safe going back to the place where you live?  Yes Need for family participation in patient care:  No (Coment)  Care giving concerns:  [address] [place] [address] [place] house so he can live near his son whom he says is his only surviving family member.   Social Worker assessment / plan:  CSW informed that PT has recommended STR. CSW spoke with patient and explained role and purpose of visit. Patient was very willing to talk with CSW and was very pleasant. Patient explained that his daughter died of cancer in February 18, 2015 and his wife died shortly thereafter. He stated he moved from [place], [place] to be in [place] so he could be closer to his son who is about 90 years old and is a Training and development officer but cannot add 5 plus 6" according the patient.   CSW explained that PT is recommending STR but that things could change and they may change their recommendation as his hospitalization continues. Patient stated he would be open to going to STR at discharge. Patient began coughing so CSW ended the assessment so patient would not have to talk further as this was exacerbating his coughing. Patient is aware that CSW will  continue to follow.   Employment status:  Retired Nurse, adult PT Recommendations:  [place] / Referral to community resources:     Patient/Family's Response to care:  Patient expressed appreciation for CSW assistance.   Patient/Family's Understanding of and Emotional Response to Diagnosis, Current Treatment, and Prognosis:  Patient is alert and oriented X4 during CSW assessment and is hopeful that he will begin to feel better soon.   Emotional Assessment Appearance:  Appears stated age Attitude/Demeanor/Rapport:   (pleasant and cooperative) Affect (typically observed):  Accepting, Adaptable, Calm, Pleasant Orientation:  Oriented to Self, Oriented to Place, Oriented to  Time, Oriented to Situation Alcohol / Substance use:  Not Applicable Psych involvement (Current and /or in the community):  No (Comment)  Discharge Needs  Concerns to be addressed:  Care Coordination Readmission within the last 30 days:  No Current discharge risk:  None Barriers to Discharge:  No Barriers Identified   Shela Leff, [place] 07/03/2017, 2:02 PM

## 2017-07-03 NOTE — Progress Notes (Signed)
Physical Therapy Treatment Patient Details Name: [name] MRN: [id number] DOB: 1941-01-28 Today's Date: 07/03/2017    History of Present Illness 76 y/o male admitted for sepsis secondary to pneumonia. Pt with complaints of cough and SOB symptoms x 10 days. Pt with history of CAD and CKD.    PT Comments    Pt reports feeling very low, energy but was willing to work with PT and try to get up to the recliner.  He did have a drop in O2 (on HFNC) from ~90 to ~80 during the effort but quickly recovered with sats 88-93% in sitting after standing/ambulation effort.  He did well with seated LE exercises and had stable vitals during that effort.  Pt is still very far from his baseline, fatigued very quickly with activity but seemed to tolerate sitting in recliner well.  Pt showed excellent continues to need STR upon discharge.   Follow Up Recommendations  SNF     Equipment Recommendations  Rolling walker with 5" wheels    Recommendations for Other Services       Precautions / Restrictions Precautions Precautions: Fall Restrictions Weight Bearing Restrictions: No    Mobility  Bed Mobility Overal bed mobility: Needs Assistance Bed Mobility: Supine to Sit     Supine to sit: Min assist     General bed mobility comments: provided arm for pt to pull up from, he did need some direct assist to get to fully upright  Transfers Overall transfer level: Needs assistance Equipment used: Rolling walker (2 wheeled) Transfers: Sit to/from Stand Sit to Stand: Min assist         General transfer comment: Pt initially unsure of himself in standing, leaning back of LEs onto bed to stabilize.  He was able to tansfer weight forward to the walker and maintained balance w/o direct assist  Ambulation/Gait Ambulation/Gait assistance: Min guard;Min assist Ambulation Distance (Feet): 3 Feet Assistive device: Rolling walker (2 wheeled)       General Gait Details: limited ambulation  secondary to fatigue, O2 drop and HFNC/other cords/tubing.  He was clearly lacking confidence and anxious about his breathing, but ultimately did not need excessive assist to get to recliner, able to manipulate walker and take steps w/o assist.   Stairs            Wheelchair Mobility    Modified Rankin (Stroke Patients Only)       Balance Overall balance assessment: Needs assistance Sitting-balance support: Feet supported Sitting balance-Leahy Scale: Good     Standing balance support: Bilateral upper extremity supported Standing balance-Leahy Scale: Fair Standing balance comment: Pt reliant on the walker and quickly fatigued with standing, able to maintain balance with CGA (rare minimal assist)                            Cognition Arousal/Alertness: Awake/alert Behavior During Therapy: WFL for tasks assessed/performed Overall Cognitive Status: Within Functional Limits for tasks assessed                                        Exercises General Exercises - Lower Extremity Long Arc Quad: Strengthening;10 reps Heel Slides: Strengthening;10 reps Hip ABduction/ADduction: Strengthening;10 reps Hip Flexion/Marching: Strengthening;10 reps    General Comments        Pertinent Vitals/Pain Pain Assessment: No/denies pain    Home Living  Prior Function            PT Goals (current goals can now be found in the care plan section) Progress towards PT goals: Progressing toward goals    Frequency    Min 2X/week      PT Plan Current plan remains appropriate    Co-evaluation              AM-PAC PT "6 Clicks" Daily Activity  Outcome Measure  Difficulty turning over in bed (including adjusting bedclothes, sheets and blankets)?: A Lot Difficulty moving from lying on back to sitting on the side of the bed? : Unable Difficulty sitting down on and standing up from a chair with arms (e.g., wheelchair,  bedside commode, etc,.)?: A Lot Help needed moving to and from a bed to chair (including a wheelchair)?: A Little Help needed walking in hospital room?: A Lot Help needed climbing 3-5 steps with a railing? : Total 6 Click Score: 11    End of Session Equipment Utilized During Treatment: Gait belt;Oxygen (HFNC) Activity Tolerance: Patient limited by fatigue Patient left: in chair;with call bell/phone within reach;with nursing/sitter in room Nurse Communication: Mobility status PT Visit Diagnosis: Unsteadiness on feet (R26.81);Other abnormalities of gait and mobility (R26.89);Difficulty in walking, not elsewhere classified (R26.2);Muscle weakness (generalized) (M62.81)     Time: 1165-7903 PT Time Calculation (min) (ACUTE ONLY): 27 min  Charges:  $Gait Training: 8-22 mins $Therapeutic Exercise: 8-22 mins                    G Codes:       Kreg Shropshire, DPT 07/03/2017, 5:12 PM

## 2017-07-04 LAB — CBC
HCT: 35.9 % — ABNORMAL LOW (ref 40.0–52.0)
Hemoglobin: 12.3 g/dL — ABNORMAL LOW (ref 13.0–18.0)
MCH: 30.9 pg (ref 26.0–34.0)
MCHC: 34.4 g/dL (ref 32.0–36.0)
MCV: 89.8 fL (ref 80.0–100.0)
PLATELETS: 459 10*3/uL — AB (ref 150–440)
RBC: 4 MIL/uL — AB (ref 4.40–5.90)
RDW: 13.5 % (ref 11.5–14.5)
WBC: 21.7 10*3/uL — ABNORMAL HIGH (ref 3.8–10.6)

## 2017-07-04 LAB — BASIC METABOLIC PANEL
ANION GAP: 9 (ref 5–15)
BUN: 26 mg/dL — AB (ref 6–20)
CALCIUM: 8.1 mg/dL — AB (ref 8.9–10.3)
CHLORIDE: 97 mmol/L — AB (ref 101–111)
CO2: 29 mmol/L (ref 22–32)
CREATININE: 1.15 mg/dL (ref 0.61–1.24)
GFR calc non Af Amer: 59 mL/min — ABNORMAL LOW (ref >60)
Glucose, Bld: 210 mg/dL — ABNORMAL HIGH (ref 65–99)
Potassium: 3.9 mmol/L (ref 3.5–5.1)
SODIUM: 135 mmol/L (ref 135–145)

## 2017-07-04 LAB — MAGNESIUM: Magnesium: 1.8 mg/dL (ref 1.7–2.4)

## 2017-07-04 LAB — PHOSPHORUS: Phosphorus: 4 mg/dL (ref 2.5–4.6)

## 2017-07-04 LAB — GLUCOSE, CAPILLARY
GLUCOSE-CAPILLARY: 246 mg/dL — AB (ref 65–99)
GLUCOSE-CAPILLARY: 331 mg/dL — AB (ref 65–99)
Glucose-Capillary: 183 mg/dL — ABNORMAL HIGH (ref 65–99)
Glucose-Capillary: 322 mg/dL — ABNORMAL HIGH (ref 65–99)

## 2017-07-04 LAB — CULTURE, RESPIRATORY W GRAM STAIN

## 2017-07-04 LAB — CULTURE, RESPIRATORY

## 2017-07-04 MED ORDER — INSULIN ASPART 100 UNIT/ML ~~LOC~~ SOLN
SUBCUTANEOUS | Status: AC
Start: 2017-07-04 — End: 2017-07-04
  Administered 2017-07-04: 10 [IU] via SUBCUTANEOUS
  Filled 2017-07-04: qty 1

## 2017-07-04 MED ORDER — INSULIN ASPART 100 UNIT/ML ~~LOC~~ SOLN
0.0000 [IU] | Freq: Three times a day (TID) | SUBCUTANEOUS | Status: DC
  Administered 2017-07-05 (×2): 20 [IU] via SUBCUTANEOUS
  Filled 2017-07-04 (×3): qty 1

## 2017-07-04 MED ORDER — INSULIN ASPART 100 UNIT/ML ~~LOC~~ SOLN
0.0000 [IU] | Freq: Every day | SUBCUTANEOUS | Status: DC
  Administered 2017-07-04: 10 [IU] via SUBCUTANEOUS

## 2017-07-04 MED ORDER — INSULIN GLARGINE 100 UNIT/ML ~~LOC~~ SOLN
10.0000 [IU] | Freq: Every day | SUBCUTANEOUS | Status: DC
  Administered 2017-07-04 – 2017-07-05 (×2): 10 [IU] via SUBCUTANEOUS
  Filled 2017-07-04 (×2): qty 0.1

## 2017-07-04 MED ORDER — METHYLPREDNISOLONE SODIUM SUCC 40 MG IJ SOLR
40.0000 mg | Freq: Two times a day (BID) | INTRAMUSCULAR | Status: DC
  Administered 2017-07-04 – 2017-07-05 (×4): 40 mg via INTRAVENOUS
  Filled 2017-07-04 (×4): qty 1

## 2017-07-04 MED ORDER — INSULIN ASPART 100 UNIT/ML ~~LOC~~ SOLN
0.0000 [IU] | Freq: Three times a day (TID) | SUBCUTANEOUS | Status: DC
  Administered 2017-07-04: 7 [IU] via SUBCUTANEOUS
  Administered 2017-07-04: 4 [IU] via SUBCUTANEOUS
  Administered 2017-07-04: 15 [IU] via SUBCUTANEOUS
  Filled 2017-07-04 (×3): qty 1

## 2017-07-04 MED ORDER — GUAIFENESIN-CODEINE 100-10 MG/5ML PO SOLN
10.0000 mL | ORAL | Status: DC | PRN
  Administered 2017-07-06: 10 mL via ORAL
  Filled 2017-07-04: qty 10

## 2017-07-04 NOTE — Progress Notes (Signed)
[place] [facility] [place] NAME: [name]    MR#:  [id number]  DATE OF BIRTH:  1941-07-19  SUBJECTIVE:   Patient here due to acute respiratory failure with hypoxia secondary to multifocal pneumonia. Difficult to wean of [place] as on minimal exertion pt's O2 sats drop. Pt. Complaining of cough with productive tan sputum.   REVIEW OF SYSTEMS:    Review of Systems  Constitutional: Negative for chills and fever.  HENT: Negative for congestion and tinnitus.   Eyes: Negative for blurred vision and double vision.  Respiratory: Positive for cough, sputum production and shortness of breath. Negative for wheezing.   Cardiovascular: Negative for chest pain, orthopnea and PND.  Gastrointestinal: Negative for abdominal pain, diarrhea, nausea and vomiting.  Genitourinary: Negative for dysuria and hematuria.  Neurological: Negative for dizziness, sensory change and focal weakness.  All other systems reviewed and are negative.   Nutrition: heart Healthy/Carb modified.  Tolerating Diet: Yes Tolerating PT:  Eval noted.   DRUG ALLERGIES:   Allergies  Allergen Reactions  . Strawberry Extract Shortness Of Breath and Swelling    Lips and throat swelled  . Ace Inhibitors Other (See Comments)    Possible angioedema  . Cardura [Doxazosin Mesylate] Other (See Comments)    Erythema  . Penicillins Rash    Has patient had a PCN reaction causing immediate rash, facial/tongue/throat swelling, SOB or lightheadedness with hypotension: Yes Has patient had a PCN reaction causing severe rash involving mucus membranes or skin necrosis: No Has patient had a PCN reaction that required hospitalization No Has patient had a PCN reaction occurring within the last 10 years: No If all of the above answers are "NO", then may proceed with Cephalosporin use.    VITALS:  Blood pressure 116/67, pulse 77, temperature 98.7 F (37.1 C), temperature source Oral, resp. rate 19,  height 5\' 9"  (1.753 m), weight 95.3 kg (210 lb), SpO2 94 %.  PHYSICAL EXAMINATION:   Physical Exam  GENERAL:  76 y.o.-year-old patient lying in bed in mild Resp. Distress on [place].  EYES: Pupils equal, round, reactive to light and accommodation. No scleral icterus. Extraocular muscles intact.  HEENT: Head atraumatic, normocephalic. Oropharynx and nasopharynx clear.  NECK:  Supple, no jugular venous distention. No thyroid enlargement, no tenderness.  LUNGS: Good a/e b/l, no wheezing, rales, diffuse rhonchi b/l.  (-) use of accessory muscles of respiration.  CARDIOVASCULAR: S1, S2 normal. No murmurs, rubs, or gallops.  ABDOMEN: Soft, nontender, nondistended. Bowel sounds present. No organomegaly or mass.  EXTREMITIES: No cyanosis, clubbing or edema b/l.    NEUROLOGIC: Cranial nerves II through XII are intact. No focal Motor or sensory deficits b/l. Globally weak   PSYCHIATRIC: The patient is alert and oriented x 3.  SKIN: No obvious rash, lesion, or ulcer.    LABORATORY PANEL:   CBC  Recent Labs Lab 07/04/17 0443  WBC 21.7*  HGB 12.3*  HCT 35.9*  PLT 459*   ------------------------------------------------------------------------------------------------------------------  Chemistries   Recent Labs Lab 06/30/17 0932  07/04/17 0443  NA 130*  < > 135  K 3.2*  < > 3.9  CL 86*  < > 97*  CO2 27  < > 29  GLUCOSE 410*  < > 210*  BUN 34*  < > 26*  CREATININE 1.57*  < > 1.15  CALCIUM 7.9*  < > 8.1*  MG  --   < > 1.8  AST 64*  --   --  ALT 67*  --   --   ALKPHOS 136*  --   --   BILITOT 1.5*  --   --   < > = values in this interval not displayed. ------------------------------------------------------------------------------------------------------------------  Cardiac Enzymes No results for input(s): TROPONINI in the last 168 hours. ------------------------------------------------------------------------------------------------------------------  RADIOLOGY:  Ct Angio  Chest Pe W Or Wo Contrast  Result Date: 07/03/2017 CLINICAL DATA:  Shortness of breath and cough. Being treated for pneumonia but not responding to treatment. EXAM: CT ANGIOGRAPHY CHEST WITH CONTRAST TECHNIQUE: Multidetector CT imaging of the chest was performed using the standard protocol during bolus administration of intravenous contrast. Multiplanar CT image reconstructions and MIPs were obtained to evaluate the vascular anatomy. CONTRAST:  75 cc Isovue 370 COMPARISON:  Chest radiography 06/30/2017 FINDINGS: Cardiovascular: Pulmonary arterial opacification is excellent. There are no pulmonary emboli. There is aortic atherosclerosis with unfolding of the aorta but no aneurysm or dissection. Coronary artery calcification is present. Mediastinum/Nodes: Mild reactive prominence of the hilar and mediastinal lymph nodes without dominant lymphadenopathy. Lungs/Pleura: Consolidation and collapse of the right lower lobe, segmental involvement of the posterior right upper lobe, and involvement of the left lower lobe. Left upper lobe remains clear. Right middle lobe remains clear. No evidence of cavitation. Only a tiny amount of free flowing pleural fluid bilaterally. Upper Abdomen: Negative Musculoskeletal: Ordinary spinal degenerative changes. Review of the MIP images confirms the above findings. IMPRESSION: Consolidative pneumonia of both lower lobes with collapse. Segmental involvement of the right upper lobe. Left upper lobe and right middle lobe or clear. Tiny amount of free flowing pleural fluid. No pulmonary emboli. Aortic atherosclerosis. Coronary artery calcification. Electronically Signed   By: Nelson Chimes [name]   On: 07/03/2017 11:26     ASSESSMENT AND PLAN:   76 year old male with past medical history of hypertension, history of coronary artery disease, BPH, status post pacemaker, history of previous CVA, anxiety who presented to the hospital due to shortness of breath and noted to be in acute  respiratory failure with hypoxia secondary to pneumonia.  1. Sepsis-secondary to multifocal pneumonia. Patient presented with leukocytosis, tachycardia and chest x-ray findings suggestive of pneumonia. -Continue IV Rocephin, Zithromax. Follow cultures which are negative so far. - afebrile Hemodynamically stable.   2. Acute respiratory failure with hypoxia-secondary to multifocal pneumonia. -CT chest yesterday showing dense pneumonia and no evidence of pulmonary embolism. Difficult to wean off high flow nasal cannula. Continue IV antibiotics with ceftriaxone, Zithromax IV steroids, bronchodilators for now. -Pulmonary following.  3.diabetes type 2-this is new onset for the patient. Hemoglobin A1c was close to 8. -Appreciate diabetes coordinator input patient - cont. Lantus, SSI for now. BS table.  Can likely be discharged on Oral meds due to financial reasons.  Likely metformin, Glipizide.   4. HTN - cont. Norvasc  5. Hyperlipidemia - cont. Atorvastatin  6. BPH - cont. Finasteride.   Transfer to floor when weaned off O2 a bit.   All the records are reviewed and case discussed with Care Management/Social Worker. Management plans discussed with the patient, family and they are in agreement.  CODE STATUS: Full code  DVT Prophylaxis: Lovenox  TOTAL TIME TAKING CARE OF THIS PATIENT: 30 minutes.   POSSIBLE D/C IN 2-3 DAYS, DEPENDING ON CLINICAL CONDITION.   Henreitta Leber M.D on 07/04/2017 at 2:47 PM  Between 7am to 6pm - Pager - [phone]  After 6pm go to [email or web address] - [employer] [facility]  Office  [phone]  CC:  Primary care physician; Monico Blitz, MD

## 2017-07-04 NOTE — Consult Note (Signed)
   Name: [name] MRN: [id number] DOB: [birth year]     CONSULTATION DATE: 06/30/2017  REFERRING MD : patel  CHIEF COMPLAINT: SOB     HISTORY OF PRESENT ILLNESS:  Patient feels better since admission however remains on high flow nasal cannula at 85% Patient denies any progressive worsening shortness of breath Blood sugars better controlled today Patient alert and awake and following commands   REVIEW OF SYSTEMS:   Constitutional: Negative for fever, chills, weight loss, malaise/fatigue and diaphoresis.  HENT: Negative for hearing loss, ear pain, nosebleeds, congestion, sore throat, neck pain, tinnitus and ear discharge.   Eyes: Negative for blurred vision, double vision, photophobia, pain, discharge and redness.  Respiratory: Negative for cough, hemoptysis, sputum production, +shortness of breath, -wheezing and stridor.   Cardiovascular: Negative for chest pain, palpitations, orthopnea, claudication, leg swelling and PND.  Gastrointestinal: Negative for heartburn, nausea, vomiting, abdominal pain, diarrhea, constipation, blood in stool and melena.   ALL OTHER ROS ARE NEGATIVE   VITAL SIGNS: Temp:  [99 F (37.2 C)-99.3 F (37.4 C)] 99.3 F (37.4 C) (09/07 2000) Pulse Rate:  [69-108] 69 (09/08 0200) Resp:  [16-24] 24 (09/08 0500) BP: (103-151)/(67-83) 121/68 (09/08 0500) SpO2:  [88 %-94 %] 90 % (09/08 0200) FiO2 (%):  [85 %] 85 % (09/08 0533)    Physical Examination:  GENERAL:NAD HEAD: Normocephalic, atraumatic.  EYES: Pupils equal, round, reactive to light.  No scleral icterus.  MOUTH: Moist mucosal membrane. NECK: Supple. No thyromegaly. No nodules. No JVD.  PULMONARY: +rhonchi CARDIOVASCULAR: S1 and S2. Regular rate and rhythm. No murmurs, rubs, or gallops.  SKIN:intact,warm,dry      Recent Labs Lab 07/02/17 0335 07/03/17 0511 07/04/17 0443  NA 135 135 135  K 3.5 3.7 3.9  CL 97* 98* 97*  CO2 29 30 29   BUN 23* 23* 26*  CREATININE 1.11 1.11 1.15   GLUCOSE 194* 171* 210*    Recent Labs Lab 07/01/17 0547 07/02/17 0335 07/04/17 0443  HGB 12.2* 12.5* 12.3*  HCT 35.1* 36.8* 35.9*  WBC 23.2* 25.5* 21.7*  PLT 342 411 459*   Antibiotics Given (last 72 hours)    Date/Time Action Medication Dose Rate   07/02/17 1152 Given   azithromycin (ZITHROMAX) tablet 250 mg 250 mg    07/02/17 1153 New Bag/Given   cefTRIAXone (ROCEPHIN) 1 g in dextrose 5 % 50 mL IVPB 1 g 100 mL/hr   07/03/17 5400 Given   azithromycin (ZITHROMAX) tablet 250 mg 250 mg    07/03/17 8676 New Bag/Given   cefTRIAXone (ROCEPHIN) 1 g in dextrose 5 % 50 mL IVPB 1 g 100 mL/hr     CT chest reviewed-No PE, b/l infiltrates noted  ASSESSMENT / PLAN:  76 yo white male with acute sepsis with acute b/l pneumonia with DKA  1.wean fio2 as needed-on high flow [place] 85% 2.continue abx 3.insulin infusion stopped 4.IVF's 5.start IV steroids  Remains Step down status due to high flow [place] requirements.  Patient  satisfied with Plan of action and management. All questions answered  Corrin Parker, [name]  [facility]  Medical Director [place] Director [facility] Cardio-Pulmonary Department

## 2017-07-04 NOTE — Progress Notes (Signed)
[place] for electrolyte management  Allergies  Allergen Reactions  . Strawberry Extract Shortness Of Breath and Swelling    Lips and throat swelled  . Ace Inhibitors Other (See Comments)    Possible angioedema  . Cardura [Doxazosin Mesylate] Other (See Comments)    Erythema  . Penicillins Rash    Has patient had a PCN reaction causing immediate rash, facial/tongue/throat swelling, SOB or lightheadedness with hypotension: Yes Has patient had a PCN reaction causing severe rash involving mucus membranes or skin necrosis: No Has patient had a PCN reaction that required hospitalization No Has patient had a PCN reaction occurring within the last 10 years: No If all of the above answers are "NO", then may proceed with Cephalosporin use.    Patient Measurements: Height: 5\' 9"  (175.3 cm) Weight: 210 lb (95.3 kg) IBW/kg (Calculated) : 70.7  Vital Signs: BP: 121/68 (09/08 0500) Pulse Rate: 69 (09/08 0200) Intake/Output from previous day: 09/07 0701 - 09/08 0700 In: 820 [P.O.:720; IV Piggyback:100] Out: 1445 [Urine:1445] Intake/Output from this shift: Total I/O In: -  Out: 115 [Urine:115]  Labs:  Recent Labs  07/02/17 0335 07/03/17 0511 07/04/17 0443  WBC 25.5*  --  21.7*  HGB 12.5*  --  12.3*  HCT 36.8*  --  35.9*  PLT 411  --  459*  CREATININE 1.11 1.11 1.15  MG 1.8  --  1.8  PHOS 2.4*  --  4.0   Estimated Creatinine Clearance: 60.3 mL/min (by C-G formula based on SCr of 1.15 mg/dL).    Medical History: Past Medical History:  Diagnosis Date  . Anxiety   . Arthritis    "hands" (09-03-2016)  . BPH (benign prostatic hypertrophy)   . BRBPR (bright red blood per rectum) ~ 01/2016; 10'/18/2017  . CAD (coronary artery disease) 09-03-2016  . Carotid artery occlusion   . Cerebrovascular disease, unspecified   . Cervicalgia   . CHF (congestive heart failure) ([place])   . Chronic kidney disease (CKD), stage III (moderate)    [name] 11/24/2016  . Enlarged prostate   . Hypertension   . Inguinal hernia without mention of obstruction or gangrene, unilateral or unspecified, (not specified as recurrent)   . Myocardial infarction [facility]) 2014; 2015; 2016   "told I'd had the 1st 2 while in the hospital in 2016; didn't even know it" (09/03/2016)  . Other psoriasis   . Pain in limb   . Presence of permanent cardiac pacemaker   . Situational depression    "after my daughter died in January 15, 2015" (09/03/16)  . Stroke [facility]) 2010; 2014   "right sided weakness since" (11/25/2016)  . Unspecified hemorrhoids without mention of complication   . Unspecified transient cerebral ischemia   . Varicose veins      Assessment: Pharmacy consulted for electrolyte management for 76 yo male admitted to ICU for DKA, now resolved, and Sepsis.    Plan:  Based on labs no electrolyte replacement warranted at this time.   Will recheck electrolytes with am labs.   Pharmacy will continue to monitor and adjust per consult.   Haylen Shelnutt K, [place] 07/04/2017,10:07 AM

## 2017-07-04 NOTE — Progress Notes (Signed)
Pharmacy Antibiotic Note  [name] is a 76 y.o. male admitted on 06/30/2017 with community acquired pneumonia.  Pharmacy has been consulted for azithromycin and ceftriaxone dosing. Patient received azithromycin and ceftriaxone in the ED.    Plan: Continue Azithromycin 250mg  PO daily and Ceftriaxone 1g IV daily.     Height: 5\' 9"  (175.3 cm) Weight: 210 lb (95.3 kg) IBW/kg (Calculated) : 70.7  Temp (24hrs), Avg:99.2 F (37.3 C), Min:99 F (37.2 C), Max:99.3 F (37.4 C)   Recent Labs Lab 06/30/17 0932 06/30/17 0939 06/30/17 1230 06/30/17 1852 07/01/17 0547 07/02/17 0335 07/03/17 0511 07/04/17 0443  WBC 29.3*  --   --   --  23.2* 25.5*  --  21.7*  CREATININE 1.57*  --  1.63* 1.43* 1.25* 1.11 1.11 1.15  LATICACIDVEN  --  1.8 2.2*  --   --   --   --   --     Estimated Creatinine Clearance: 60.3 mL/min (by C-G formula based on SCr of 1.15 mg/dL).    Allergies  Allergen Reactions  . Strawberry Extract Shortness Of Breath and Swelling    Lips and throat swelled  . Ace Inhibitors Other (See Comments)    Possible angioedema  . Cardura [Doxazosin Mesylate] Other (See Comments)    Erythema  . Penicillins Rash    Has patient had a PCN reaction causing immediate rash, facial/tongue/throat swelling, SOB or lightheadedness with hypotension: Yes Has patient had a PCN reaction causing severe rash involving mucus membranes or skin necrosis: No Has patient had a PCN reaction that required hospitalization No Has patient had a PCN reaction occurring within the last 10 years: No If all of the above answers are "NO", then may proceed with Cephalosporin use.    Antimicrobials this admission: 9/4 Azithromycin >>  9/4 Ceftriaxone >>   Dose adjustments this admission N/A   Microbiology results: 9/4 BCx: Coag negative staph in 1 culture, no growth in other  9/4 MRSA [id number]  9/4 Sputum: few budding yeast, rare gram variable rods   Thank you for allowing pharmacy to be a  part of this patient's care.  Olivia Canter, [facility] 07/04/2017 10:05 AM

## 2017-07-05 LAB — GLUCOSE, CAPILLARY
GLUCOSE-CAPILLARY: 454 mg/dL — AB (ref 65–99)
GLUCOSE-CAPILLARY: 377 mg/dL — AB (ref 65–99)
GLUCOSE-CAPILLARY: 357 mg/dL — AB (ref 65–99)
GLUCOSE-CAPILLARY: 350 mg/dL — AB (ref 65–99)
GLUCOSE-CAPILLARY: 334 mg/dL — AB (ref 65–99)
Glucose-Capillary: 399 mg/dL — ABNORMAL HIGH (ref 65–99)
Glucose-Capillary: 448 mg/dL — ABNORMAL HIGH (ref 65–99)
Glucose-Capillary: 473 mg/dL — ABNORMAL HIGH (ref 65–99)
Glucose-Capillary: 346 mg/dL — ABNORMAL HIGH (ref 65–99)
Glucose-Capillary: 378 mg/dL — ABNORMAL HIGH (ref 65–99)
Glucose-Capillary: 257 mg/dL — ABNORMAL HIGH (ref 65–99)

## 2017-07-05 LAB — BASIC METABOLIC PANEL
ANION GAP: 10 (ref 5–15)
BUN: 35 mg/dL — ABNORMAL HIGH (ref 6–20)
CALCIUM: 8.5 mg/dL — AB (ref 8.9–10.3)
CHLORIDE: 94 mmol/L — AB (ref 101–111)
CO2: 26 mmol/L (ref 22–32)
CREATININE: 1.15 mg/dL (ref 0.61–1.24)
GFR calc non Af Amer: 59 mL/min — ABNORMAL LOW (ref >60)
Glucose, Bld: 402 mg/dL — ABNORMAL HIGH (ref 65–99)
Potassium: 4.6 mmol/L (ref 3.5–5.1)
SODIUM: 130 mmol/L — AB (ref 135–145)

## 2017-07-05 LAB — CULTURE, BLOOD (ROUTINE X 2): Culture: NO GROWTH

## 2017-07-05 LAB — PROCALCITONIN: Procalcitonin: 0.23 ng/mL

## 2017-07-05 LAB — PHOSPHORUS: PHOSPHORUS: 4.3 mg/dL (ref 2.5–4.6)

## 2017-07-05 LAB — MAGNESIUM: MAGNESIUM: 1.9 mg/dL (ref 1.7–2.4)

## 2017-07-05 MED ORDER — INSULIN REGULAR HUMAN 100 UNIT/ML IJ SOLN
INTRAMUSCULAR | Status: DC
  Administered 2017-07-05: 3.9 [IU]/h via INTRAVENOUS
  Administered 2017-07-06: 2.9 [IU]/h via INTRAVENOUS
  Filled 2017-07-05 (×2): qty 1

## 2017-07-05 MED ORDER — INSULIN GLARGINE 100 UNIT/ML ~~LOC~~ SOLN
10.0000 [IU] | Freq: Once | SUBCUTANEOUS | Status: AC
Start: 2017-07-05 — End: 2017-07-05
  Administered 2017-07-05: 10 [IU] via SUBCUTANEOUS
  Filled 2017-07-05: qty 0.1

## 2017-07-05 MED ORDER — SODIUM CHLORIDE 0.9 % IV SOLN
INTRAVENOUS | Status: DC
  Administered 2017-07-05: 16:00:00 via INTRAVENOUS

## 2017-07-05 MED ORDER — DEXTROSE-NACL 5-0.45 % IV SOLN
INTRAVENOUS | Status: DC
  Administered 2017-07-06: 02:00:00 via INTRAVENOUS

## 2017-07-05 MED ORDER — DEXTROSE 50 % IV SOLN: 25.0000 mL | INTRAVENOUS | Status: DC | PRN

## 2017-07-05 MED ORDER — INSULIN REGULAR BOLUS VIA INFUSION
0.0000 [IU] | Freq: Three times a day (TID) | INTRAVENOUS | Status: DC
  Administered 2017-07-05: 4.8 [IU] via INTRAVENOUS
  Administered 2017-07-06: 5.5 [IU] via INTRAVENOUS
  Filled 2017-07-05: qty 10

## 2017-07-05 NOTE — Progress Notes (Signed)
[place] for electrolyte management  Allergies  Allergen Reactions  . Strawberry Extract Shortness Of Breath and Swelling    Lips and throat swelled  . Ace Inhibitors Other (See Comments)    Possible angioedema  . Cardura [Doxazosin Mesylate] Other (See Comments)    Erythema  . Penicillins Rash    Has patient had a PCN reaction causing immediate rash, facial/tongue/throat swelling, SOB or lightheadedness with hypotension: Yes Has patient had a PCN reaction causing severe rash involving mucus membranes or skin necrosis: No Has patient had a PCN reaction that required hospitalization No Has patient had a PCN reaction occurring within the last 10 years: No If all of the above answers are "NO", then may proceed with Cephalosporin use.    Patient Measurements: Height: 5\' 9"  (175.3 cm) Weight: 210 lb (95.3 kg) IBW/kg (Calculated) : 70.7  Vital Signs: Temp: 97.7 F (36.5 C) (09/09 0727) Temp Source: Oral (09/09 0727) BP: 125/76 (09/09 0700) Pulse Rate: 60 (09/09 0500) Intake/Output from previous day: 09/08 0701 - 09/09 0700 In: 1100 [P.O.:1050; IV Piggyback:50] Out: 1080 [Urine:1080] Intake/Output from this shift: Total I/O In: -  Out: 375 [Urine:375]  Labs:  Recent Labs  07/03/17 0511 07/04/17 0443 07/05/17 0610  WBC  --  21.7*  --   HGB  --  12.3*  --   HCT  --  35.9*  --   PLT  --  459*  --   CREATININE 1.11 1.15 1.15  MG  --  1.8 1.9  PHOS  --  4.0 4.3   Estimated Creatinine Clearance: 60.3 mL/min (by C-G formula based on SCr of 1.15 mg/dL).    Medical History: Past Medical History:  Diagnosis Date  . Anxiety   . Arthritis    "hands" (August 15, 2016)  . BPH (benign prostatic hypertrophy)   . BRBPR (bright red blood per rectum) ~ 01/2016; 10'/18/2017  . CAD (coronary artery disease) 2016-08-15  . Carotid artery occlusion   . Cerebrovascular disease, unspecified   . Cervicalgia   . CHF (congestive heart failure)  ([place])   . Chronic kidney disease (CKD), stage III (moderate)    [name] 11/24/2016  . Enlarged prostate   . Hypertension   . Inguinal hernia without mention of obstruction or gangrene, unilateral or unspecified, (not specified as recurrent)   . Myocardial infarction [facility]) 2014; 2015; 2016   "told I'd had the 1st 2 while in the hospital in 2016; didn't even know it" (08/15/16)  . Other psoriasis   . Pain in limb   . Presence of permanent cardiac pacemaker   . Situational depression    "after my daughter died in December 27, 2014" (2016/08/15)  . Stroke [facility]) 2010; 2014   "right sided weakness since" (11/25/2016)  . Unspecified hemorrhoids without mention of complication   . Unspecified transient cerebral ischemia   . Varicose veins      Assessment: Pharmacy consulted for electrolyte management for 76 yo male admitted to ICU for DKA, now resolved, and Sepsis.    Plan:  Based on labs no electrolyte replacement warranted at this time.   Will recheck electrolytes with am labs.   Pharmacy will continue to monitor and adjust per consult.   Nahome Bublitz K, RPH 07/05/2017,8:11 AM

## 2017-07-05 NOTE — Progress Notes (Signed)
Dr Mortimer Fries aware BS= 399. Increase in ss novolog insulin ordered. 20u Novolog given SQ. Pt eats all food off  Breakfast tray

## 2017-07-05 NOTE — Progress Notes (Signed)
Weaned to O2 to 6L/[place] today. O2 sat goal 88% or better. Dyspneic with exertion. Loose usually non-productive cough. Lung sounds diminished throughout. Sats drop below 88% with excess talking and exertion.  Denies pain. AV paced. Insulin resistant on steroids. Glucostabilzer and IV insulin gtt started. Eats all food on trays. Carb modified/Heart Healthy diet.

## 2017-07-05 NOTE — Progress Notes (Signed)
[name] at [place] NAME: [name]    MR#:  [id number]  DATE OF BIRTH:  10-02-1941  SUBJECTIVE:   Patient here due to acute respiratory failure with hypoxia secondary to multifocal pneumonia. Weaned off [place] and now on 6 L [place] and doing well.  No shortness of breath, + cough with sputum production.    REVIEW OF SYSTEMS:    Review of Systems  Constitutional: Negative for chills and fever.  HENT: Negative for congestion and tinnitus.   Eyes: Negative for blurred vision and double vision.  Respiratory: Positive for cough, sputum production and shortness of breath. Negative for wheezing.   Cardiovascular: Negative for chest pain, orthopnea and PND.  Gastrointestinal: Negative for abdominal pain, diarrhea, nausea and vomiting.  Genitourinary: Negative for dysuria and hematuria.  Neurological: Negative for dizziness, sensory change and focal weakness.  All other systems reviewed and are negative.   Nutrition: heart Healthy/Carb modified.  Tolerating Diet: Yes Tolerating PT:  Eval noted.   DRUG ALLERGIES:   Allergies  Allergen Reactions  . Strawberry Extract Shortness Of Breath and Swelling    Lips and throat swelled  . Ace Inhibitors Other (See Comments)    Possible angioedema  . Cardura [Doxazosin Mesylate] Other (See Comments)    Erythema  . Penicillins Rash    Has patient had a PCN reaction causing immediate rash, facial/tongue/throat swelling, SOB or lightheadedness with hypotension: Yes Has patient had a PCN reaction causing severe rash involving mucus membranes or skin necrosis: No Has patient had a PCN reaction that required hospitalization No Has patient had a PCN reaction occurring within the last 10 years: No If all of the above answers are "NO", then may proceed with Cephalosporin use.    VITALS:  Blood pressure (!) 80/60, pulse 98, temperature 97.9 F (36.6 C), temperature source Oral, resp. rate (!) 23, height  5\' 9"  (1.753 m), weight 95.3 kg (210 lb), SpO2 (!) 89 %.  PHYSICAL EXAMINATION:   Physical Exam  GENERAL:  76 y.o.-year-old patient lying in bed in mild Resp. Distress on HifLo .  EYES: Pupils equal, round, reactive to light and accommodation. No scleral icterus. Extraocular muscles intact.  HEENT: Head atraumatic, normocephalic. Oropharynx and nasopharynx clear.  NECK:  Supple, no jugular venous distention. No thyroid enlargement, no tenderness.  LUNGS: Good a/e b/l, no wheezing, rales, minimal rhonchi b/l.  (-) use of accessory muscles of respiration.  CARDIOVASCULAR: S1, S2 normal. No murmurs, rubs, or gallops.  ABDOMEN: Soft, nontender, nondistended. Bowel sounds present. No organomegaly or mass.  EXTREMITIES: No cyanosis, clubbing or edema b/l.    NEUROLOGIC: Cranial nerves II through XII are intact. No focal Motor or sensory deficits b/l. Globally weak   PSYCHIATRIC: The patient is alert and oriented x 3.  SKIN: No obvious rash, lesion, or ulcer.    LABORATORY PANEL:   CBC  Recent Labs Lab 07/04/17 0443  WBC 21.7*  HGB 12.3*  HCT 35.9*  PLT 459*   ------------------------------------------------------------------------------------------------------------------  Chemistries   Recent Labs Lab 06/30/17 0932  07/05/17 0610  NA 130*  < > 130*  K 3.2*  < > 4.6  CL 86*  < > 94*  CO2 27  < > 26  GLUCOSE 410*  < > 402*  BUN 34*  < > 35*  CREATININE 1.57*  < > 1.15  CALCIUM 7.9*  < > 8.5*  MG  --   < > 1.9  AST 64*  --   --  ALT 67*  --   --   ALKPHOS 136*  --   --   BILITOT 1.5*  --   --   < > = values in this interval not displayed. ------------------------------------------------------------------------------------------------------------------  Cardiac Enzymes No results for input(s): TROPONINI in the last 168 hours. ------------------------------------------------------------------------------------------------------------------  RADIOLOGY:  No results  found.   ASSESSMENT AND PLAN:   76 year old male with past medical history of hypertension, history of coronary artery disease, BPH, status post pacemaker, history of previous CVA, anxiety who presented to the hospital due to shortness of breath and noted to be in acute respiratory failure with hypoxia secondary to pneumonia.  1. Sepsis-secondary to multifocal pneumonia. Patient presented with leukocytosis, tachycardia and chest x-ray findings suggestive of pneumonia. -Continue IV Rocephin, Zithromax. Follow cultures which are negative so far. - afebrile Hemodynamically stable.   2. Acute respiratory failure with hypoxia-secondary to multifocal pneumonia. -CT chest showing dense pneumonia and no evidence of pulmonary embolism.  - improved w/ IV steroids, bronchodilators and empiric abx. As mentioned. Weaned off Hiflo and now on [place] 6 L and will cont. To wean as tolerated.    3.diabetes type 2-this is new onset for the patient. Hemoglobin A1c was close to 8. -Appreciate diabetes coordinator input patient - cont. Lantus, SSI for now. BS table.  Can likely be discharged on Oral meds due to financial reasons.  Likely metformin, Glipizide.   4. HTN - cont. Norvasc  5. Hyperlipidemia - cont. Atorvastatin  6. BPH - cont. Finasteride.   Will need SNF upon discharge.   All the records are reviewed and case discussed with Care Management/Social Worker. Management plans discussed with the patient, family and they are in agreement.  CODE STATUS: Full code  DVT Prophylaxis: Lovenox  TOTAL TIME TAKING CARE OF THIS PATIENT: 25 minutes.   POSSIBLE D/C IN 2-3 DAYS, DEPENDING ON CLINICAL CONDITION.   Henreitta Leber M.D on 07/05/2017 at 3:11 PM  Between 7am to 6pm - Pager - [phone]  After 6pm go to [email or web address] - [employer] [place] [facility]  Office  [phone]  CC: Primary care physician; Monico Blitz, MD

## 2017-07-05 NOTE — Consult Note (Signed)
   Name: [name] MRN: [id number] DOB: 08-07-1941     CONSULTATION DATE: 06/30/2017  REFERRING MD : patel  CHIEF COMPLAINT: SOB   HISTORY OF PRESENT ILLNESS:  Patient feels better since admission however remains on high flow nasal cannula at 65% Patient denies any progressive worsening shortness of breath FSBS elevated Patient alert and awake and following commands fio2 at 65%  REVIEW OF SYSTEMS:   Constitutional: Negative for fever, chills, weight loss, malaise/fatigue and diaphoresis.  HENT: Negative for hearing loss, ear pain, nosebleeds, congestion, sore throat, neck pain, tinnitus and ear discharge.   Eyes: Negative for blurred vision, double vision, photophobia, pain, discharge and redness.  Respiratory: Negative for cough, hemoptysis, sputum production, +shortness of breath, -wheezing and stridor.   Cardiovascular: Negative for chest pain, palpitations, orthopnea, claudication, leg swelling and PND.  Gastrointestinal: Negative for heartburn, nausea, vomiting, abdominal pain, diarrhea, constipation, blood in stool and melena.   ALL OTHER ROS ARE NEGATIVE     VITAL SIGNS: Temp:  [97.7 F (36.5 C)-99 F (37.2 C)] 97.7 F (36.5 C) (09/09 0727) Pulse Rate:  [60-88] 60 (09/09 0500) Resp:  [14-31] 17 (09/09 0700) BP: (107-135)/(59-101) 125/76 (09/09 0700) SpO2:  [84 %-100 %] 89 % (09/09 0727) FiO2 (%):  [70 %-95 %] 70 % (09/09 0727)   Physical Examination:  GENERAL:NAD HEAD: Normocephalic, atraumatic.  EYES: Pupils equal, round, reactive to light.  No scleral icterus.  MOUTH: Moist mucosal membrane. NECK: Supple. No thyromegaly. No nodules. No JVD.  PULMONARY: +rhonchi CARDIOVASCULAR: S1 and S2. Regular rate and rhythm. No murmurs, rubs, or gallops.  SKIN:intact,warm,dry       Recent Labs Lab 07/03/17 0511 07/04/17 0443 07/05/17 0610  NA 135 135 130*  K 3.7 3.9 4.6  CL 98* 97* 94*  CO2 30 29 26   BUN 23* 26* 35*  CREATININE 1.11 1.15 1.15    GLUCOSE 171* 210* 402*    Recent Labs Lab 07/01/17 0547 07/02/17 0335 07/04/17 0443  HGB 12.2* 12.5* 12.3*  HCT 35.1* 36.8* 35.9*  WBC 23.2* 25.5* 21.7*  PLT 342 411 459*   Antibiotics Given (last 72 hours)    Date/Time Action Medication Dose Rate   07/02/17 1152 Given   azithromycin (ZITHROMAX) tablet 250 mg 250 mg    07/02/17 1153 New Bag/Given   cefTRIAXone (ROCEPHIN) 1 g in dextrose 5 % 50 mL IVPB 1 g 100 mL/hr   07/03/17 4098 Given   azithromycin (ZITHROMAX) tablet 250 mg 250 mg    07/03/17 1191 New Bag/Given   cefTRIAXone (ROCEPHIN) 1 g in dextrose 5 % 50 mL IVPB 1 g 100 mL/hr   07/04/17 1220 Given   azithromycin (ZITHROMAX) tablet 250 mg 250 mg    07/04/17 1249 New Bag/Given   cefTRIAXone (ROCEPHIN) 1 g in dextrose 5 % 50 mL IVPB 1 g 100 mL/hr     CT chest reviewed-No PE, b/l infiltrates noted  ASSESSMENT / PLAN:  76 yo white male with acute sepsis with acute b/l pneumonia with DKA  1.wean fio2 as needed-on high flow [place] 65% 2.continue abx 3.insulin infusion stopped, will need aggressive insulin coverage 4.IVF's 5.continue IV steroids 6.care management consult  Place floor care status but remain in Unit  Patient  satisfied with Plan of action and management. All questions answered  Corrin Parker, [name]  [facility]  Medical Director [place] Director [facility] Cardio-Pulmonary Department

## 2017-07-05 NOTE — Progress Notes (Signed)
BS=448. Dr Mortimer Fries notified. Additional 10units lantus ordered and 20u novolog given SQ.

## 2017-07-05 NOTE — Progress Notes (Signed)
Spoke with Patria Mane, NP about patient not having a sliding scale insulin coverage ordered for his HS CBG reading. NP to place orders for this RN to complete.

## 2017-07-05 NOTE — Progress Notes (Signed)
Glucose 454.  Dr Mortimer Fries aware.  Orders for glucostabilizer and insulin gtt non DKA

## 2017-07-06 ENCOUNTER — Inpatient Hospital Stay: Payer: Medicare Other

## 2017-07-06 LAB — GLUCOSE, CAPILLARY
GLUCOSE-CAPILLARY: 156 mg/dL — AB (ref 65–99)
GLUCOSE-CAPILLARY: 174 mg/dL — AB (ref 65–99)
GLUCOSE-CAPILLARY: 181 mg/dL — AB (ref 65–99)
GLUCOSE-CAPILLARY: 192 mg/dL — AB (ref 65–99)
GLUCOSE-CAPILLARY: 230 mg/dL — AB (ref 65–99)
GLUCOSE-CAPILLARY: 238 mg/dL — AB (ref 65–99)
GLUCOSE-CAPILLARY: 247 mg/dL — AB (ref 65–99)
GLUCOSE-CAPILLARY: 253 mg/dL — AB (ref 65–99)
Glucose-Capillary: 131 mg/dL — ABNORMAL HIGH (ref 65–99)
Glucose-Capillary: 135 mg/dL — ABNORMAL HIGH (ref 65–99)
Glucose-Capillary: 158 mg/dL — ABNORMAL HIGH (ref 65–99)
Glucose-Capillary: 203 mg/dL — ABNORMAL HIGH (ref 65–99)
Glucose-Capillary: 149 mg/dL — ABNORMAL HIGH (ref 65–99)
Glucose-Capillary: 168 mg/dL — ABNORMAL HIGH (ref 65–99)
Glucose-Capillary: 352 mg/dL — ABNORMAL HIGH (ref 65–99)
Glucose-Capillary: 260 mg/dL — ABNORMAL HIGH (ref 65–99)

## 2017-07-06 LAB — BASIC METABOLIC PANEL
Anion gap: 8 (ref 5–15)
BUN: 41 mg/dL — AB (ref 6–20)
CHLORIDE: 99 mmol/L — AB (ref 101–111)
CO2: 28 mmol/L (ref 22–32)
Calcium: 8.5 mg/dL — ABNORMAL LOW (ref 8.9–10.3)
Creatinine, Ser: 1.27 mg/dL — ABNORMAL HIGH (ref 0.61–1.24)
GFR calc Af Amer: >60 mL/min (ref >60)
GFR calc non Af Amer: 52 mL/min — ABNORMAL LOW (ref >60)
GLUCOSE: 178 mg/dL — AB (ref 65–99)
POTASSIUM: 4.8 mmol/L (ref 3.5–5.1)
Sodium: 135 mmol/L (ref 135–145)

## 2017-07-06 LAB — MAGNESIUM: Magnesium: 2.1 mg/dL (ref 1.7–2.4)

## 2017-07-06 LAB — PROCALCITONIN: Procalcitonin: 0.16 ng/mL

## 2017-07-06 MED ORDER — IPRATROPIUM-ALBUTEROL 0.5-2.5 (3) MG/3ML IN SOLN: 3.0000 mL | RESPIRATORY_TRACT | Status: DC | PRN

## 2017-07-06 MED ORDER — INSULIN GLARGINE 100 UNIT/ML ~~LOC~~ SOLN
50.0000 [IU] | Freq: Every day | SUBCUTANEOUS | Status: DC
  Administered 2017-07-06: 50 [IU] via SUBCUTANEOUS
  Filled 2017-07-06 (×2): qty 0.5

## 2017-07-06 MED ORDER — INSULIN ASPART 100 UNIT/ML ~~LOC~~ SOLN
0.0000 [IU] | Freq: Three times a day (TID) | SUBCUTANEOUS | Status: DC
  Administered 2017-07-06: 4 [IU] via SUBCUTANEOUS
  Administered 2017-07-06: 11 [IU] via SUBCUTANEOUS
  Administered 2017-07-07: 13:00:00 4 [IU] via SUBCUTANEOUS
  Administered 2017-07-08: 7 [IU] via SUBCUTANEOUS
  Administered 2017-07-08: 3 [IU] via SUBCUTANEOUS
  Administered 2017-07-08: 17:00:00 7 [IU] via SUBCUTANEOUS
  Administered 2017-07-09: 13:00:00 11 [IU] via SUBCUTANEOUS
  Administered 2017-07-09: 09:00:00 3 [IU] via SUBCUTANEOUS
  Filled 2017-07-06 (×9): qty 1

## 2017-07-06 MED ORDER — LIVING WELL WITH DIABETES BOOK
Freq: Once | Status: AC
  Administered 2017-07-06: 12:00:00
  Filled 2017-07-06: qty 1

## 2017-07-06 MED ORDER — LEVOFLOXACIN 500 MG PO TABS
500.0000 mg | ORAL_TABLET | Freq: Every day | ORAL | Status: DC
  Administered 2017-07-07 – 2017-07-09 (×3): 500 mg via ORAL
  Filled 2017-07-06 (×3): qty 1

## 2017-07-06 MED ORDER — INSULIN ASPART 100 UNIT/ML ~~LOC~~ SOLN
0.0000 [IU] | Freq: Every day | SUBCUTANEOUS | Status: DC
  Administered 2017-07-06: 3 [IU] via SUBCUTANEOUS
  Administered 2017-07-08: 2 [IU] via SUBCUTANEOUS
  Filled 2017-07-06 (×2): qty 1

## 2017-07-06 NOTE — Progress Notes (Signed)
Report called to 1C nurse . Prepare to transfer to room 115 per bed.

## 2017-07-06 NOTE — Care Management (Signed)
I was finally able to speak with patient's friend [place] [phone]. She kept coughing during our phone conversation. She does not know address of boarding home but said she would get it and call this RNCM back with that information. She states she met patient at homeless shelter and they are just good friends. O2 referral sent to [facility] with [facility]. Patient appears to be a new diabetic also.

## 2017-07-06 NOTE — Progress Notes (Signed)
No distress  No new complaints  Vitals:   07/06/17 1200 07/06/17 1300 07/06/17 1400 07/06/17 1500  BP: 101/78 113/63 114/73 137/71  Pulse: 65 65 68 63  Resp: 20 20 (!) 27 19  Temp:      TempSrc:      SpO2: 90% (!) 89% 92% 94%  Weight:      Height:        NAD HEENT WNL Neck supple Chest clear HS normal, no M Abdomen soft, + BS Extremities warm, no edema No focal neuro deficits  BMP Latest Ref Rng & Units 07/06/2017 07/05/2017 07/04/2017  Glucose 65 - 99 mg/dL 178(H) 402(H) 210(H)  BUN 6 - 20 mg/dL 41(H) 35(H) 26(H)  Creatinine 0.61 - 1.24 mg/dL 1.27(H) 1.15 1.15  Sodium 135 - 145 mmol/L 135 130(L) 135  Potassium 3.5 - 5.1 mmol/L 4.8 4.6 3.9  Chloride 101 - 111 mmol/L 99(L) 94(L) 97(L)  CO2 22 - 32 mmol/L 28 26 29   Calcium 8.9 - 10.3 mg/dL 8.5(L) 8.5(L) 8.1(L)    CBC Latest Ref Rng & Units 07/04/2017 07/02/2017 07/01/2017  WBC 3.8 - 10.6 K/uL 21.7(H) 25.5(H) 23.2(H)  Hemoglobin 13.0 - 18.0 g/dL 12.3(L) 12.5(L) 12.2(L)  Hematocrit 40.0 - 52.0 % 35.9(L) 36.8(L) 35.1(L)  Platelets 150 - 440 K/uL 459(H) 411 342    CXR: Bibasilar airspace disease  IMPRESSION: BLL PNA - NOS DKA, new dx on DM  PLAN/REC:  Transition off of insulin drip to SSI Change ABX to levofloxacin and complete 5 more days Transfer to MedSurg floor After transfer, PCCM will sign off. Please call if we can be of further assistance    Merton Border, MD PCCM service Mobile [phone] Pager [phone] 07/06/2017 3:54 PM

## 2017-07-06 NOTE — Plan of Care (Signed)
Problem: Food- and Nutrition-Related Knowledge Deficit (NB-1.1) Goal: Nutrition education Formal process to instruct or train a patient/client in a skill or to impart knowledge to help patients/clients voluntarily manage or modify food choices and eating behavior to maintain or improve health. Outcome: Adequate for Discharge  RD received verbal consult for nutrition education regarding diabetes.   Lab Results  Component Value Date   HGBA1C 8.4 (H) 07/01/2017   Spoke with patient at bedside. He reports he has never had elevated blood sugars before, but he has several family members who have had diabetes. His wife had DM type 1 and he used to assist her with her insulin shots. He has several family members with DM type 2. He has a good appetite. He reports he is finishing 100% of meals. He typically eats 2-3 meals per day. He eats cereal in the morning. He reports he eats meat with vegetables or sandwiches for lunch and dinner. He reports he does not enjoy sweets anymore. Patient reports that due to his cataracts he has difficulty reading.  RD provided "Carbohydrate Counting for People with Diabetes" handout from the Academy of Nutrition and Dietetics. Discussed different food groups and their effects on blood sugar, emphasizing carbohydrate-containing foods. Provided list of carbohydrates and recommended serving sizes of common foods.  RD provided "Using Nutrition Labels: Carbohydrates" handout from the Academy of Nutrition and Dietetics. Discussed how to read a nutrition label including looking at serving size and servings per container. Reviewed that there are 15 grams of carbohydrate in one carbohydrate choice. Encouraged patient to use chart for range of carbohydrate grams per choice when reading nutrition labels.  Discussed importance of controlled and consistent carbohydrate intake throughout the day. Provided examples of ways to balance meals/snacks and encouraged intake of high-fiber, whole  grain complex carbohydrates. Teach back method used.  Expect poor compliance. Patient would benefit from ongoing reinforcement of appropriate diet. Noted PT has recommended SNF. Patient would have access to carbohydrate modified diet at SNF and also an RD if he has further questions.  Body mass index is 31.01 kg/m. Pt meets criteria for Obesity Class I based on current BMI.  Current diet order is Heart Healthy/Carbohydrate Modifed, patient is consuming approximately 80-100% of meals at this time. Labs and medications reviewed. No further nutrition interventions warranted at this time. RD contact information provided. If additional nutrition issues arise, please re-consult RD.  Willey Blade, MS, RD, LDN Pager: [phone] After Hours Pager: [phone]

## 2017-07-06 NOTE — Care Management (Signed)
Patient's friend [place] never called back with address of patient.

## 2017-07-06 NOTE — Progress Notes (Signed)
[facility] for electrolyte management  Allergies  Allergen Reactions  . Strawberry Extract Shortness Of Breath and Swelling    Lips and throat swelled  . Ace Inhibitors Other (See Comments)    Possible angioedema  . Cardura [Doxazosin Mesylate] Other (See Comments)    Erythema  . Penicillins Rash    Has patient had a PCN reaction causing immediate rash, facial/tongue/throat swelling, SOB or lightheadedness with hypotension: Yes Has patient had a PCN reaction causing severe rash involving mucus membranes or skin necrosis: No Has patient had a PCN reaction that required hospitalization No Has patient had a PCN reaction occurring within the last 10 years: No If all of the above answers are "NO", then may proceed with Cephalosporin use.    Patient Measurements: Height: 5\' 9"  (175.3 cm) Weight: 210 lb (95.3 kg) IBW/kg (Calculated) : 70.7  Vital Signs: Temp: 97.5 F (36.4 C) (09/10 0700) Temp Source: Oral (09/10 0700) BP: 110/57 (09/10 1000) Pulse Rate: 76 (09/10 1000) Intake/Output from previous day: 09/09 0701 - 09/10 0700 In: 1843.7 [P.O.:1320; I.V.:473.7; IV Piggyback:50] Out: 2125 [Urine:2125] Intake/Output from this shift: Total I/O In: 512 [P.O.:360; I.V.:152] Out: 275 [Urine:275]  Labs:  Recent Labs  07/04/17 0443 07/05/17 0610 07/06/17 0520  WBC 21.7*  --   --   HGB 12.3*  --   --   HCT 35.9*  --   --   PLT 459*  --   --   CREATININE 1.15 1.15 1.27*  MG 1.8 1.9 2.1  PHOS 4.0 4.3  --    Estimated Creatinine Clearance: 54.6 mL/min (A) (by C-G formula based on SCr of 1.27 mg/dL (H)).    Medical History: Past Medical History:  Diagnosis Date  . Anxiety   . Arthritis    "hands" (08/22/16)  . BPH (benign prostatic hypertrophy)   . BRBPR (bright red blood per rectum) ~ 01/2016; 10'/18/2017  . CAD (coronary artery disease) 22-Aug-2016  . Carotid artery occlusion   . Cerebrovascular disease, unspecified   .  Cervicalgia   . CHF (congestive heart failure) ([place])   . Chronic kidney disease (CKD), stage III (moderate)    [name] 11/24/2016  . Enlarged prostate   . Hypertension   . Inguinal hernia without mention of obstruction or gangrene, unilateral or unspecified, (not specified as recurrent)   . Myocardial infarction [facility]) 2014; 2015; 2016   "told I'd had the 1st 2 while in the hospital in 2016; didn't even know it" (08/22/2016)  . Other psoriasis   . Pain in limb   . Presence of permanent cardiac pacemaker   . Situational depression    "after my daughter died in 01/03/2015" (2016-08-22)  . Stroke [facility]) 2010; 2014   "right sided weakness since" (11/25/2016)  . Unspecified hemorrhoids without mention of complication   . Unspecified transient cerebral ischemia   . Varicose veins      Assessment: Pharmacy consulted for electrolyte management for 76 yo male admitted to ICU for DKA, now resolved, and Sepsis.    Plan:  Based on labs no electrolyte replacement warranted at this time.   Will recheck electrolytes with am labs.   Pharmacy will continue to monitor and adjust per consult.   Durwin Reges, PharmD Student 07/06/2017,11:59 AM

## 2017-07-06 NOTE — Progress Notes (Signed)
Physical Therapy Treatment Patient Details Name: [name] MRN: [id number] DOB: [birth year] Today's Date: 07/06/2017    History of Present Illness 76 y/o male admitted for sepsis secondary to pneumonia. Pt with complaints of cough and SOB symptoms x 10 days. Pt with history of CAD and CKD.    PT Comments    Pt is making good progress towards goals with increased activity tolerance this date on 6L of O2. Pt still fatigues quickly and de-sats with exertion, however pt aware and is able to purse lip breathing with cues. Good tolerance for supine there-ex and agreeable to therapy. Will continue to progress.   Follow Up Recommendations  SNF     Equipment Recommendations  Rolling walker with 5" wheels    Recommendations for Other Services       Precautions / Restrictions Precautions Precautions: Fall Restrictions Weight Bearing Restrictions: No    Mobility  Bed Mobility Overal bed mobility: Needs Assistance Bed Mobility: Supine to Sit     Supine to sit: Min guard     General bed mobility comments: safe technique performed. Once seated at EOB, able to sit with upright posture.  Transfers Overall transfer level: Needs assistance Equipment used: Rolling walker (2 wheeled) Transfers: Sit to/from Stand Sit to Stand: Min assist         General transfer comment: Once standing, pt braces back of B LEs against bed. UPright posture noted. On 6L of O2 for all mobility. Safe technique performed with sats at 93%.  Ambulation/Gait Ambulation/Gait assistance: Min assist Ambulation Distance (Feet): 3 Feet Assistive device: Rolling walker (2 wheeled) Gait Pattern/deviations: Step-to pattern     General Gait Details: ambulated to recliner chair, slight post leaning noted. Heavy reliance on RW. Unsteady ambulating towards recliner, however no formal LOB noted. Once seated, O2 sats decrease to 81% on 6L taking several minutes for recovery with cues for pursed lip  breathing.   Stairs            Wheelchair Mobility    Modified Rankin (Stroke Patients Only)       Balance                                            Cognition Arousal/Alertness: Awake/alert Behavior During Therapy: WFL for tasks assessed/performed Overall Cognitive Status: Within Functional Limits for tasks assessed                                        Exercises Other Exercises Other Exercises: supine ther-ex performed on B LE including SLRs, hip abd/add, hip add squeezes, and ankle pumps. All ther-ex performed x 15 reps with cga for assistance. Pt needed breaks as necessary secondary to O2 sats decreasing to 83%.    General Comments        Pertinent Vitals/Pain Pain Assessment: No/denies pain    Home Living                      Prior Function            PT Goals (current goals can now be found in the care plan section) Acute Rehab PT Goals Patient Stated Goal: to get stronger PT Goal Formulation: With patient Time For Goal Achievement: 07/16/17 Potential to Achieve Goals: Good Progress towards  PT goals: Progressing toward goals    Frequency    Min 2X/week      PT Plan Current plan remains appropriate    Co-evaluation              AM-PAC PT "6 Clicks" Daily Activity  Outcome Measure  Difficulty turning over in bed (including adjusting bedclothes, sheets and blankets)?: A Lot Difficulty moving from lying on back to sitting on the side of the bed? : Unable Difficulty sitting down on and standing up from a chair with arms (e.g., wheelchair, bedside commode, etc,.)?: A Lot Help needed moving to and from a bed to chair (including a wheelchair)?: A Little Help needed walking in hospital room?: A Lot Help needed climbing 3-5 steps with a railing? : Total 6 Click Score: 11    End of Session Equipment Utilized During Treatment: Oxygen Activity Tolerance: Patient limited by fatigue Patient left:  in chair;with call bell/phone within reach;with nursing/sitter in room Nurse Communication: Mobility status PT Visit Diagnosis: Unsteadiness on feet (R26.81);Other abnormalities of gait and mobility (R26.89);Difficulty in walking, not elsewhere classified (R26.2);Muscle weakness (generalized) (M62.81)     Time: 1610-9604 PT Time Calculation (min) (ACUTE ONLY): 25 min  Charges:  $Gait Training: 8-22 mins $Therapeutic Exercise: 8-22 mins                    G Codes:       [name], PT, DPT [phone]    [name] 07/06/2017, 11:41 AM

## 2017-07-06 NOTE — Progress Notes (Signed)
[facility] [place] NAME: [name]    MR#:  [id number]  DATE OF BIRTH:  30-Apr-1941  SUBJECTIVE:   Patient here due to acute respiratory failure with hypoxia secondary to multifocal pneumonia. Much improved and off hiflo [place] now.  Was hyperglycemic due to IV steroids and was on insulin gtt but now being weaned off.  Plan for transfer to floor later today.   REVIEW OF SYSTEMS:    Review of Systems  Constitutional: Negative for chills and fever.  HENT: Negative for congestion and tinnitus.   Eyes: Negative for blurred vision and double vision.  Respiratory: Positive for cough, sputum production and shortness of breath. Negative for wheezing.   Cardiovascular: Negative for chest pain, orthopnea and PND.  Gastrointestinal: Negative for abdominal pain, diarrhea, nausea and vomiting.  Genitourinary: Negative for dysuria and hematuria.  Neurological: Negative for dizziness, sensory change and focal weakness.  All other systems reviewed and are negative.   Nutrition: heart Healthy/Carb modified.  Tolerating Diet: Yes Tolerating PT:  Eval noted.   DRUG ALLERGIES:   Allergies  Allergen Reactions  . Strawberry Extract Shortness Of Breath and Swelling    Lips and throat swelled  . Ace Inhibitors Other (See Comments)    Possible angioedema  . Cardura [Doxazosin Mesylate] Other (See Comments)    Erythema  . Penicillins Rash    Has patient had a PCN reaction causing immediate rash, facial/tongue/throat swelling, SOB or lightheadedness with hypotension: Yes Has patient had a PCN reaction causing severe rash involving mucus membranes or skin necrosis: No Has patient had a PCN reaction that required hospitalization No Has patient had a PCN reaction occurring within the last 10 years: No If all of the above answers are "NO", then may proceed with Cephalosporin use.    VITALS:  Blood pressure 113/63, pulse 65, temperature (!) 97.5 F (36.4 C),  temperature source Oral, resp. rate 20, height 5\' 9"  (1.753 m), weight 95.3 kg (210 lb), SpO2 (!) 89 %.  PHYSICAL EXAMINATION:   Physical Exam  GENERAL:  76 y.o.-year-old patient sitting up in chair in NAD.  EYES: Pupils equal, round, reactive to light and accommodation. No scleral icterus. Extraocular muscles intact.  HEENT: Head atraumatic, normocephalic. Oropharynx and nasopharynx clear.  NECK:  Supple, no jugular venous distention. No thyroid enlargement, no tenderness.  LUNGS: Good a/e b/l, no wheezing, rales, rhonchi b/l.  (-) use of accessory muscles of respiration.  CARDIOVASCULAR: S1, S2 normal. No murmurs, rubs, or gallops.  ABDOMEN: Soft, nontender, nondistended. Bowel sounds present. No organomegaly or mass.  EXTREMITIES: No cyanosis, clubbing or edema b/l.    NEUROLOGIC: Cranial nerves II through XII are intact. No focal Motor or sensory deficits b/l.  PSYCHIATRIC: The patient is alert and oriented x 3.  SKIN: No obvious rash, lesion, or ulcer.    LABORATORY PANEL:   CBC  Recent Labs Lab 07/04/17 0443  WBC 21.7*  HGB 12.3*  HCT 35.9*  PLT 459*   ------------------------------------------------------------------------------------------------------------------  Chemistries   Recent Labs Lab 06/30/17 0932  07/06/17 0520  NA 130*  < > 135  K 3.2*  < > 4.8  CL 86*  < > 99*  CO2 27  < > 28  GLUCOSE 410*  < > 178*  BUN 34*  < > 41*  CREATININE 1.57*  < > 1.27*  CALCIUM 7.9*  < > 8.5*  MG  --   < > 2.1  AST 64*  --   --  ALT 67*  --   --   ALKPHOS 136*  --   --   BILITOT 1.5*  --   --   < > = values in this interval not displayed. ------------------------------------------------------------------------------------------------------------------  Cardiac Enzymes No results for input(s): TROPONINI in the last 168 hours. ------------------------------------------------------------------------------------------------------------------  RADIOLOGY:  Dg Chest  Port 1 View  Result Date: 07/06/2017 CLINICAL DATA:  Respiratory failure, sepsis, community-acquired pneumonia, diabetic ketoacidosis. History of CHF, previous MI, chronic renal insufficiency EXAM: PORTABLE CHEST 1 VIEW COMPARISON:  Chest x-ray of September 4th 2018 and chest CT scan of July 04, 2017 FINDINGS: The lungs are reasonably well inflated. There is persistent hazy increased density inferiorly in the right upper lobe. Bibasilar densities persist. Small amounts of pleural fluid are present bilaterally. The heart is top-normal in size. The pulmonary vascularity is not engorged. There is calcification in the wall of the aortic arch. The ICD is in stable position. The bony thorax exhibits no acute abnormality. IMPRESSION: Persistent bibasilar atelectasis or pneumonia. Persistent interstitial infiltrate inferiorly in the right upper lobe. No overt CHF. Thoracic aortic atherosclerosis. Electronically Signed   By: David  Martinique [name]   On: 07/06/2017 09:49     ASSESSMENT AND PLAN:   76 year old male with past medical history of hypertension, history of coronary artery disease, BPH, status post pacemaker, history of previous CVA, anxiety who presented to the hospital due to shortness of breath and noted to be in acute respiratory failure with hypoxia secondary to pneumonia.  1. Sepsis-secondary to multifocal pneumonia. Patient presented with leukocytosis, tachycardia and chest x-ray findings suggestive of pneumonia. - was on IV Rocephin/Zithromax and now switched to Oral Levaquin.  Sputum Cx growing gram (-) rod but not identified yet.   - afebrile Hemodynamically stable.   2. Acute respiratory failure with hypoxia-secondary to multifocal pneumonia. -CT chest showing dense pneumonia and no evidence of pulmonary embolism.  - much improved and off Hiflo [place] and now 6 L [place] and doing well.  Wean O2 as tolerated.  - off IV steroids, cont. Duonebs, Albuterol nebs as needed.   3.diabetes type 2-this  is new onset for the patient. Hemoglobin A1c was close to 8. - BS were up elevated due to IV steroids which have now being discontinued. Was on Insulin gtt and now weaned off.  -Appreciate diabetes coordinator input patient - cont. Lantus, SSI for now.  Can likely be discharged on Oral meds due to financial reasons.  Likely metformin, Glipizide.   4. HTN - cont. Norvasc. BP stable.   5. Hyperlipidemia - cont. Atorvastatin  6. BPH - cont. Finasteride.   Transfer to floor today and d/c in 1-2 days.    All the records are reviewed and case discussed with Care Management/Social Worker. Management plans discussed with the patient, family and they are in agreement.  CODE STATUS: Full code  DVT Prophylaxis: Lovenox  TOTAL TIME TAKING CARE OF THIS PATIENT: 30 minutes.   POSSIBLE D/C IN 1-2 DAYS, DEPENDING ON CLINICAL CONDITION.   Henreitta Leber M.D on 07/06/2017 at 3:32 PM  Between 7am to 6pm - Pager - [phone]  After 6pm go to [email or web address] - [employer] [facility] [facility]  Office  [phone]  CC: Primary care physician; Monico Blitz, MD

## 2017-07-06 NOTE — Progress Notes (Signed)
[place] NP made aware of a 7 beat run of Vtach. No new orders at this time.

## 2017-07-06 NOTE — Progress Notes (Signed)
Alert and oriented. Was admitted with DKA and PNA on Sept 4.  He weaned off HFNC to O2 at 6L/[place] yesterday. He has remained at O2 6L/[place].  O2 sat 88-94%. MD goal sat 88% or greater.  He desats briefly to low 80's when in and out of bed or when talking a lot.  Lungs diminished throughout. Productive cough of white to tan secretions. Using incentive spirometer. Up in chair for several hours today. Abx changed to oral levaquin. Generalized weakness and fatigues easily.Marland Kitchen  Unsteady on feet.  Insulin gtt off at 1130. (50u lantus  SQ given two hours prior).  He has very good appetite. Steroids were Dc'd today. Living with Diabetes booklet given. States he is unable to read due to cataracts.  Symptoms of high and low blood sugars discussed. He has given his wife insulin in past, but says he could not give himself insulin due to poor vision. PT, dietician, Care manager and diabetic coordinator are seeing pt. Prior to admission he was renting room at a boarding house.

## 2017-07-06 NOTE — Progress Notes (Addendum)
Inpatient Diabetes Program Recommendations  AACE/ADA: New Consensus Statement on Inpatient Glycemic Control (2015)  Target Ranges:  Prepandial:   less than 140 mg/dL      Peak postprandial:   less than 180 mg/dL (1-2 hours)      Critically ill patients:  140 - 180 mg/dL   Lab Results  Component Value Date   GLUCAP 174 (H) 07/06/2017   HGBA1C 8.4 (H) 07/01/2017    Inpatient Diabetes Program Recommendations:    Note blood sugars increased with IV steroids and therefore IV insulin started. Patient transitioning off IV insulin today.  Based on A1C and homeless status, patient likely will be able to be on oral agents such as Metformin/Glipizide (both are generic).  I spoke with patient last week x2 regarding new diagnosis of diabetes, hypoglycemia, hyperglycemia, etc.  He states that he cannot see due too cataracts.  Patient's sife had diabetes so he is familiar with signs, symptoms and treatment of low blood sugars.  Hopefully since steroids are stopped, patient will be more controlled.  Will follow.  Thanks, Adah Perl, RN, BC-ADM Inpatient Diabetes Coordinator Pager [phone] (8a-5p)  1440- Note that steroids stopped, please consider reducing Lantus to 20 units daily.  Called and discussed with RN.  Will follow-up on 07/07/17.

## 2017-07-06 NOTE — Progress Notes (Signed)
Patient did not rest well throughout night due to frequent fingersticks. Patient remains on insulin gtt. Blood glucose more controlled. Patient had an asymptomatic 7 beat run of Vtach during night. Patient remains on 6L nasal cannula.

## 2017-07-07 LAB — BASIC METABOLIC PANEL
ANION GAP: 8 (ref 5–15)
BUN: 40 mg/dL — ABNORMAL HIGH (ref 6–20)
CO2: 29 mmol/L (ref 22–32)
Calcium: 8.2 mg/dL — ABNORMAL LOW (ref 8.9–10.3)
Chloride: 101 mmol/L (ref 101–111)
Creatinine, Ser: 1.24 mg/dL (ref 0.61–1.24)
GFR calc Af Amer: >60 mL/min (ref >60)
GFR calc non Af Amer: 54 mL/min — ABNORMAL LOW (ref >60)
GLUCOSE: 81 mg/dL (ref 65–99)
POTASSIUM: 4.4 mmol/L (ref 3.5–5.1)
Sodium: 138 mmol/L (ref 135–145)

## 2017-07-07 LAB — GLUCOSE, CAPILLARY
GLUCOSE-CAPILLARY: 102 mg/dL — AB (ref 65–99)
GLUCOSE-CAPILLARY: 198 mg/dL — AB (ref 65–99)
Glucose-Capillary: 74 mg/dL (ref 65–99)
Glucose-Capillary: 189 mg/dL — ABNORMAL HIGH (ref 65–99)

## 2017-07-07 MED ORDER — INSULIN GLARGINE 100 UNIT/ML ~~LOC~~ SOLN
15.0000 [IU] | Freq: Every day | SUBCUTANEOUS | Status: DC
  Administered 2017-07-08 – 2017-07-09 (×2): 15 [IU] via SUBCUTANEOUS
  Filled 2017-07-07 (×2): qty 0.15

## 2017-07-07 NOTE — Progress Notes (Signed)
[facility] for electrolyte management  Allergies  Allergen Reactions  . Strawberry Extract Shortness Of Breath and Swelling    Lips and throat swelled  . Ace Inhibitors Other (See Comments)    Possible angioedema  . Cardura [Doxazosin Mesylate] Other (See Comments)    Erythema  . Penicillins Rash    Has patient had a PCN reaction causing immediate rash, facial/tongue/throat swelling, SOB or lightheadedness with hypotension: Yes Has patient had a PCN reaction causing severe rash involving mucus membranes or skin necrosis: No Has patient had a PCN reaction that required hospitalization No Has patient had a PCN reaction occurring within the last 10 years: No If all of the above answers are "NO", then may proceed with Cephalosporin use.    Patient Measurements: Height: 5\' 9"  (175.3 cm) Weight: 210 lb (95.3 kg) IBW/kg (Calculated) : 70.7  Vital Signs: Temp: 98.1 F (36.7 C) (09/11 0444) Temp Source: Oral (09/10 2033) BP: 122/74 (09/11 0444) Pulse Rate: 43 (09/11 0444) Intake/Output from previous day: 09/10 0701 - 09/11 0700 In: 4801 [P.O.:1200; I.V.:152] Out: 1975 [KPVVZ:4827] Intake/Output from this shift: Total I/O In: 240 [P.O.:240] Out: 1300 [Urine:1300]  Labs:  Recent Labs  07/05/17 0610 07/06/17 0520 07/07/17 0516  CREATININE 1.15 1.27* 1.24  MG 1.9 2.1  --   PHOS 4.3  --   --    Estimated Creatinine Clearance: 55.9 mL/min (by C-G formula based on SCr of 1.24 mg/dL).    Medical History: Past Medical History:  Diagnosis Date  . Anxiety   . Arthritis    "hands" (08/24/16)  . BPH (benign prostatic hypertrophy)   . BRBPR (bright red blood per rectum) ~ 01/2016; 10'/18/2017  . CAD (coronary artery disease) 24-Aug-2016  . Carotid artery occlusion   . Cerebrovascular disease, unspecified   . Cervicalgia   . CHF (congestive heart failure) ([place])   . Chronic kidney disease (CKD), stage III (moderate)    [name]  11/24/2016  . Enlarged prostate   . Hypertension   . Inguinal hernia without mention of obstruction or gangrene, unilateral or unspecified, (not specified as recurrent)   . Myocardial infarction [facility]) 2014; 2015; 2016   "told I'd had the 1st 2 while in the hospital in 2016; didn't even know it" (24-Aug-2016)  . Other psoriasis   . Pain in limb   . Presence of permanent cardiac pacemaker   . Situational depression    "after my daughter died in January 05, 2015" (08-24-2016)  . Stroke [facility]) 2010; 2014   "right sided weakness since" (11/25/2016)  . Unspecified hemorrhoids without mention of complication   . Unspecified transient cerebral ischemia   . Varicose veins      Assessment: Pharmacy consulted for electrolyte management for 76 yo male admitted to ICU for DKA, now resolved, and Sepsis.    Plan:  Based on labs no electrolyte replacement warranted at this time.   Will recheck electrolytes with am labs.   9/11 @ 0500 electrolytes WNL, will monitor w/ routine labs.  Pharmacy will continue to monitor and adjust per consult.   Tobie Lords, [facility] 07/07/2017,6:35 AM

## 2017-07-07 NOTE — Progress Notes (Signed)
Inpatient Diabetes Program Recommendations  AACE/ADA: New Consensus Statement on Inpatient Glycemic Control (2015)  Target Ranges:  Prepandial:   less than 140 mg/dL      Peak postprandial:   less than 180 mg/dL (1-2 hours)      Critically ill patients:  140 - 180 mg/dL   Lab Results  Component Value Date   GLUCAP 74 07/07/2017   HGBA1C 8.4 (H) 07/01/2017    Review of Glycemic ControlResults for JOCOB, DAMBACH (MRN [id number]) as of 07/07/2017 08:54  Ref. Range 07/06/2017 16:01 07/06/2017 17:02 07/06/2017 21:18 07/07/2017 07:36  Glucose-Capillary Latest Ref Range: 65 - 99 mg/dL 352 (H) 260 (H) 253 (H) 74   Diabetes history: Type 2 diabetes-new onset Outpatient Diabetes medications: None Current orders for Inpatient glycemic control:  Novolog resistant tid with meals and HS Lantus 50 units daily  Inpatient Diabetes Program Recommendations:   Please reduce Lantus to 15 units daily.  Based on A1C, it appears that blood sugars may be able to be controlled at home with oral agents only?  If patient goes to nursing home, then insulin can be continued.    Thanks, Adah Perl, RN, BC-ADM Inpatient Diabetes Coordinator Pager [phone] (8a-5p)

## 2017-07-07 NOTE — Care Management Important Message (Signed)
Important Message  Patient Details  Name: [name] MRN: [id number] Date of Birth: 1941/04/21   Medicare Important Message Given:  Yes    Shelbie Ammons, RN 07/07/2017, 8:03 AM

## 2017-07-07 NOTE — Progress Notes (Signed)
[facility] at [place] NAME: [name]    MR#:  [id number]  DATE OF BIRTH:  15-Nov-1940  SUBJECTIVE:   Patient here due to acute respiratory failure with hypoxia secondary to multifocal pneumonia. Blood sugars were a bit low this morning. Shortness of breath much improved. Denies any other complaints.  REVIEW OF SYSTEMS:    Review of Systems  Constitutional: Negative for chills and fever.  HENT: Negative for congestion and tinnitus.   Eyes: Negative for blurred vision and double vision.  Respiratory: Positive for cough, sputum production and shortness of breath. Negative for wheezing.   Cardiovascular: Negative for chest pain, orthopnea and PND.  Gastrointestinal: Negative for abdominal pain, diarrhea, nausea and vomiting.  Genitourinary: Negative for dysuria and hematuria.  Neurological: Negative for dizziness, sensory change and focal weakness.  All other systems reviewed and are negative.   Nutrition: heart Healthy/Carb modified.  Tolerating Diet: Yes Tolerating PT:  Eval noted.   DRUG ALLERGIES:   Allergies  Allergen Reactions  . Strawberry Extract Shortness Of Breath and Swelling    Lips and throat swelled  . Ace Inhibitors Other (See Comments)    Possible angioedema  . Cardura [Doxazosin Mesylate] Other (See Comments)    Erythema  . Penicillins Rash    Has patient had a PCN reaction causing immediate rash, facial/tongue/throat swelling, SOB or lightheadedness with hypotension: Yes Has patient had a PCN reaction causing severe rash involving mucus membranes or skin necrosis: No Has patient had a PCN reaction that required hospitalization No Has patient had a PCN reaction occurring within the last 10 years: No If all of the above answers are "NO", then may proceed with Cephalosporin use.    VITALS:  Blood pressure (!) 98/54, pulse 68, temperature 98.7 F (37.1 C), temperature source Oral, resp. rate (!) 22, height 5\' 9"   (1.753 m), weight 95.3 kg (210 lb), SpO2 91 %.  PHYSICAL EXAMINATION:   Physical Exam  GENERAL:  76 y.o.-year-old patient lying in bed in NAD.  EYES: Pupils equal, round, reactive to light and accommodation. No scleral icterus. Extraocular muscles intact.  HEENT: Head atraumatic, normocephalic. Oropharynx and nasopharynx clear.  NECK:  Supple, no jugular venous distention. No thyroid enlargement, no tenderness.  LUNGS: Good a/e b/l, no wheezing, rales, rhonchi b/l.  (-) use of accessory muscles of respiration.  CARDIOVASCULAR: S1, S2 normal. No murmurs, rubs, or gallops.  ABDOMEN: Soft, nontender, nondistended. Bowel sounds present. No organomegaly or mass.  EXTREMITIES: No cyanosis, clubbing or edema b/l.    NEUROLOGIC: Cranial nerves II through XII are intact. No focal Motor or sensory deficits b/l.  PSYCHIATRIC: The patient is alert and oriented x 3.  SKIN: No obvious rash, lesion, or ulcer.    LABORATORY PANEL:   CBC  Recent Labs Lab 07/04/17 0443  WBC 21.7*  HGB 12.3*  HCT 35.9*  PLT 459*   ------------------------------------------------------------------------------------------------------------------  Chemistries   Recent Labs Lab 07/06/17 0520 07/07/17 0516  NA 135 138  K 4.8 4.4  CL 99* 101  CO2 28 29  GLUCOSE 178* 81  BUN 41* 40*  CREATININE 1.27* 1.24  CALCIUM 8.5* 8.2*  MG 2.1  --    ------------------------------------------------------------------------------------------------------------------  Cardiac Enzymes No results for input(s): TROPONINI in the last 168 hours. ------------------------------------------------------------------------------------------------------------------  RADIOLOGY:  Dg Chest Port 1 View  Result Date: 07/06/2017 CLINICAL DATA:  Respiratory failure, sepsis, community-acquired pneumonia, diabetic ketoacidosis. History of CHF, previous MI, chronic renal insufficiency EXAM: PORTABLE CHEST  1 VIEW COMPARISON:  Chest x-ray  of September 4th 2018 and chest CT scan of July 04, 2017 FINDINGS: The lungs are reasonably well inflated. There is persistent hazy increased density inferiorly in the right upper lobe. Bibasilar densities persist. Small amounts of pleural fluid are present bilaterally. The heart is top-normal in size. The pulmonary vascularity is not engorged. There is calcification in the wall of the aortic arch. The ICD is in stable position. The bony thorax exhibits no acute abnormality. IMPRESSION: Persistent bibasilar atelectasis or pneumonia. Persistent interstitial infiltrate inferiorly in the right upper lobe. No overt CHF. Thoracic aortic atherosclerosis. Electronically Signed   By: David  Martinique [name]   On: 07/06/2017 09:49     ASSESSMENT AND PLAN:   76 year old male with past medical history of hypertension, history of coronary artery disease, BPH, status post pacemaker, history of previous CVA, anxiety who presented to the hospital due to shortness of breath and noted to be in acute respiratory failure with hypoxia secondary to pneumonia.  1. Sepsis-secondary to multifocal pneumonia. Patient presented with leukocytosis, tachycardia and chest x-ray findings suggestive of pneumonia. - cont. Levaquin Sputum Cx growing gram (-) rod but not identified yet.   - afebrile Hemodynamically stable.   2. Acute respiratory failure with hypoxia-secondary to multifocal pneumonia. -CT chest showing dense pneumonia and no evidence of pulmonary embolism.  - much improved and off Hiflo [place] and now 6 L [place] and doing well.  Cont. To wean O2 as tolerated.  - off IV steroids, cont. Duonebs, Albuterol nebs as needed.   3.diabetes type 2-this is new onset for the patient. Hemoglobin A1c was close to 8.  - was hypoglycemic this a.m. And will lower Lantus to 15 units.  -Appreciate diabetes coordinator input patient - cont. Lower dose Lantus, SSI for now.  Can likely be discharged on Oral meds due to financial reasons.  Likely  metformin, Glipizide.   4. HTN - cont. Norvasc. BP stable.   5. Hyperlipidemia - cont. Atorvastatin  6. BPH - cont. Finasteride.   Discharge to SNF in next few days once weaned of O2.   All the records are reviewed and case discussed with Care Management/Social Worker. Management plans discussed with the patient, family and they are in agreement.  CODE STATUS: Full code  DVT Prophylaxis: Lovenox  TOTAL TIME TAKING CARE OF THIS PATIENT: 30 minutes.   POSSIBLE D/C IN 1-2 DAYS, DEPENDING ON CLINICAL CONDITION.   Henreitta Leber M.D on 07/07/2017 at 1:40 PM  Between 7am to 6pm - Pager - [phone]  After 6pm go to [email or web address] - Technical brewer [facility]  Office  [phone]  CC: Primary care physician; Monico Blitz, MD

## 2017-07-07 NOTE — Progress Notes (Signed)
Pt states he "feels weak" this morning.  Blood sugar significantly lower than it has been (CBG 74).  Oxygen weaned overnight to 4L.  Oxygen saturations 87 %, increased back to 6L to get saturations to 91%. Will continue to closely monitor patient. Pt encouraged to call if he feels any different or worse

## 2017-07-08 LAB — GLUCOSE, CAPILLARY
Glucose-Capillary: 134 mg/dL — ABNORMAL HIGH (ref 65–99)
Glucose-Capillary: 246 mg/dL — ABNORMAL HIGH (ref 65–99)
Glucose-Capillary: 223 mg/dL — ABNORMAL HIGH (ref 65–99)
Glucose-Capillary: 224 mg/dL — ABNORMAL HIGH (ref 65–99)

## 2017-07-08 MED ORDER — SALINE SPRAY 0.65 % NA SOLN
1.0000 | NASAL | Status: DC | PRN
  Filled 2017-07-08: qty 44

## 2017-07-08 MED ORDER — PHENOL 1.4 % MT LIQD
1.0000 | OROMUCOSAL | Status: DC | PRN
  Filled 2017-07-08: qty 177

## 2017-07-08 NOTE — Progress Notes (Signed)
[facility] at [place] NAME: Marquon Alcala    MR#:  [id number]  DATE OF BIRTH:  [birth year]  SUBJECTIVE:   No acute events overnight. Shortness of breath much improved. Still has a cough which is Somewhat productive at times  REVIEW OF SYSTEMS:    Review of Systems  Constitutional: Negative for chills and fever.  HENT: Negative for congestion and tinnitus.   Eyes: Negative for blurred vision and double vision.  Respiratory: Positive for cough, sputum production and shortness of breath. Negative for wheezing.   Cardiovascular: Negative for chest pain, orthopnea and PND.  Gastrointestinal: Negative for abdominal pain, diarrhea, nausea and vomiting.  Genitourinary: Negative for dysuria and hematuria.  Neurological: Negative for dizziness, sensory change and focal weakness.  All other systems reviewed and are negative.   Nutrition: heart Healthy/Carb modified.  Tolerating Diet: Yes Tolerating PT:  Eval noted.   DRUG ALLERGIES:   Allergies  Allergen Reactions  . Strawberry Extract Shortness Of Breath and Swelling    Lips and throat swelled  . Ace Inhibitors Other (See Comments)    Possible angioedema  . Cardura [Doxazosin Mesylate] Other (See Comments)    Erythema  . Penicillins Rash    Has patient had a PCN reaction causing immediate rash, facial/tongue/throat swelling, SOB or lightheadedness with hypotension: Yes Has patient had a PCN reaction causing severe rash involving mucus membranes or skin necrosis: No Has patient had a PCN reaction that required hospitalization No Has patient had a PCN reaction occurring within the last 10 years: No If all of the above answers are "NO", then may proceed with Cephalosporin use.    VITALS:  Blood pressure (!) 117/57, pulse 61, temperature (!) 97.5 F (36.4 C), temperature source Oral, resp. rate 12, height 5\' 9"  (1.753 m), weight 95.3 kg (210 lb), SpO2 94 %.  PHYSICAL EXAMINATION:    Physical Exam  GENERAL:  76 y.o.-year-old patient lying in bed in NAD.  EYES: Pupils equal, round, reactive to light and accommodation. No scleral icterus. Extraocular muscles intact.  HEENT: Head atraumatic, normocephalic. Oropharynx and nasopharynx clear.  NECK:  Supple, no jugular venous distention. No thyroid enlargement, no tenderness.  LUNGS: Good a/e b/l, no wheezing, rales, rhonchi b/l.  (-) use of accessory muscles of respiration.  CARDIOVASCULAR: S1, S2 normal. No murmurs, rubs, or gallops.  ABDOMEN: Soft, nontender, nondistended. Bowel sounds present. No organomegaly or mass.  EXTREMITIES: No cyanosis, clubbing or edema b/l.    NEUROLOGIC: Cranial nerves II through XII are intact. No focal Motor or sensory deficits b/l.  PSYCHIATRIC: The patient is alert and oriented x 3.  SKIN: No obvious rash, lesion, or ulcer.    LABORATORY PANEL:   CBC  Recent Labs Lab 07/04/17 0443  WBC 21.7*  HGB 12.3*  HCT 35.9*  PLT 459*   ------------------------------------------------------------------------------------------------------------------  Chemistries   Recent Labs Lab 07/06/17 0520 07/07/17 0516  NA 135 138  K 4.8 4.4  CL 99* 101  CO2 28 29  GLUCOSE 178* 81  BUN 41* 40*  CREATININE 1.27* 1.24  CALCIUM 8.5* 8.2*  MG 2.1  --    ------------------------------------------------------------------------------------------------------------------  Cardiac Enzymes No results for input(s): TROPONINI in the last 168 hours. ------------------------------------------------------------------------------------------------------------------  RADIOLOGY:  No results found.   ASSESSMENT AND PLAN:   76 year old male with past medical history of hypertension, history of coronary artery disease, BPH, status post pacemaker, history of previous CVA, anxiety who presented to the hospital due to shortness of  breath and noted to be in acute respiratory failure with hypoxia secondary  to pneumonia.  1. Sepsis-secondary to multifocal pneumonia. Patient presented with leukocytosis, tachycardia and chest x-ray findings suggestive of pneumonia. - cont. Levaquin Sputum Cx showing no significant growth.   - afebrile Hemodynamically stable. Will check CBC in a.m.   2. Acute respiratory failure with hypoxia-secondary to multifocal pneumonia. -CT chest showing dense pneumonia and no evidence of pulmonary embolism.  - much improved and off Hiflo [place] and now 6 L [place].  Will wean as tolerated.  Will likely need to be on O2 upon discharge.  - off IV steroids, cont. Duonebs, Albuterol nebs as needed.   3.diabetes type 2-this is new onset for the patient. Hemoglobin A1c was close to 8.  - no further hypoglycemia. Continue Lantus, sliding scale insulin. -Appreciate diabetes coordinator input patient - cont. Lower dose Lantus, SSI for now.  Can likely be discharged on Oral meds due to financial reasons.  Likely metformin, Glipizide.   4. HTN - cont. Norvasc. BP stable.   5. Hyperlipidemia - cont. Atorvastatin  6. BPH - cont. Finasteride.   Likely discharge to SNF tomorrow on Oxygen.   All the records are reviewed and case discussed with Care Management/Social Worker. Management plans discussed with the patient, family and they are in agreement.  CODE STATUS: Full code  DVT Prophylaxis: Lovenox  TOTAL TIME TAKING CARE OF THIS PATIENT: 25 minutes.   POSSIBLE D/C IN 1-2 DAYS, DEPENDING ON CLINICAL CONDITION.   Henreitta Leber M.D on 07/08/2017 at 1:23 PM  Between 7am to 6pm - Pager - [phone]  After 6pm go to [email or web address] - Technical brewer [facility]  Office  [phone]  CC: Primary care physician; Monico Blitz, MD

## 2017-07-08 NOTE — Progress Notes (Signed)
Physical Therapy Treatment Patient Details Name: [name] MRN: [id number] DOB: [birth year] Today's Date: 07/08/2017    History of Present Illness 76 y/o male admitted for sepsis secondary to pneumonia. Pt with complaints of cough and SOB symptoms x 10 days. Pt with history of CAD and CKD.    PT Comments    Mr. Milner made excellent progress with mobility, ambulating 80 ft with RW today.  He does, however, continue to require close min guard assist with transfers and ambulation due to instability and poor safety awareness.  SpO2 remained at or above 90% on 5L O2 except one very brief episode of 87% while ambulating.  SNF remains most appropriate d/c plan.    Follow Up Recommendations  SNF     Equipment Recommendations  Rolling walker with 5" wheels    Recommendations for Other Services       Precautions / Restrictions Precautions Precautions: Fall;Other (comment) Precaution Comments: Monitor O2 Restrictions Weight Bearing Restrictions: No    Mobility  Bed Mobility               General bed mobility comments: Pt sitting in chair at start and end of session  Transfers Overall transfer level: Needs assistance Equipment used: Rolling walker (2 wheeled) Transfers: Sit to/from Stand Sit to Stand: Min guard         General transfer comment: Cues for hand placement and safe technique using RW as pt has tendency to keep both hands on RW wit sit<>stand.    Ambulation/Gait Ambulation/Gait assistance: Min guard Ambulation Distance (Feet): 80 Feet Assistive device: Rolling walker (2 wheeled) Gait Pattern/deviations: Step-through pattern;Decreased stride length;Trunk flexed Gait velocity: decreased Gait velocity interpretation: Below normal speed for age/gender General Gait Details: Cues for upright posture for improved pulmonary function.  Decreased gait speed and instability with turns.  Close min guard provided.   Stairs            Wheelchair  Mobility    Modified Rankin (Stroke Patients Only)       Balance Overall balance assessment: Needs assistance Sitting-balance support: No upper extremity supported;Feet supported Sitting balance-Leahy Scale: Good     Standing balance support: During functional activity;Bilateral upper extremity supported Standing balance-Leahy Scale: Poor Standing balance comment: Pt relies on UE support for static and dynamic activities                            Cognition Arousal/Alertness: Awake/alert Behavior During Therapy: WFL for tasks assessed/performed Overall Cognitive Status: Within Functional Limits for tasks assessed                                        Exercises General Exercises - Lower Extremity Ankle Circles/Pumps: AROM;Both;10 reps;Seated Straight Leg Raises: Strengthening;Both;10 reps;Seated Other Exercises Other Exercises: Seated scapalar squeezes in sitting with max verbal cues and demonstration as pt demonstrates compensatory UT activation initially. Other Exercises: Encouraged pt to practice upright posture regardless of his positioning (in bed, sitting, standing, walking), for improved pulmonary function.    General Comments General comments (skin integrity, edema, etc.): SpO2 remained at or above 90% on 5L O2 except one very brief episode of 87% while ambulating.        Pertinent Vitals/Pain Pain Assessment: No/denies pain    Home Living  Prior Function            PT Goals (current goals can now be found in the care plan section) Acute Rehab PT Goals Patient Stated Goal: to get stronger PT Goal Formulation: With patient Time For Goal Achievement: 07/16/17 Potential to Achieve Goals: Good Progress towards PT goals: Progressing toward goals    Frequency    Min 2X/week      PT Plan Current plan remains appropriate    Co-evaluation              AM-PAC PT "6 Clicks" Daily Activity   Outcome Measure  Difficulty turning over in bed (including adjusting bedclothes, sheets and blankets)?: A Little Difficulty moving from lying on back to sitting on the side of the bed? : A Lot Difficulty sitting down on and standing up from a chair with arms (e.g., wheelchair, bedside commode, etc,.)?: A Lot Help needed moving to and from a bed to chair (including a wheelchair)?: A Little Help needed walking in hospital room?: A Little Help needed climbing 3-5 steps with a railing? : A Lot 6 Click Score: 15    End of Session Equipment Utilized During Treatment: Oxygen;Gait belt Activity Tolerance: Patient limited by fatigue;Patient tolerated treatment well Patient left: in chair;with call bell/phone within reach;with chair alarm set Nurse Communication: Mobility status;Other (comment) (SpO2) PT Visit Diagnosis: Unsteadiness on feet (R26.81);Other abnormalities of gait and mobility (R26.89);Difficulty in walking, not elsewhere classified (R26.2);Muscle weakness (generalized) (M62.81)     Time: 1610-9604 PT Time Calculation (min) (ACUTE ONLY): 16 min  Charges:  $Gait Training: 8-22 mins                    G Codes:       Collie Siad PT, DPT 07/08/2017, 3:17 PM

## 2017-07-08 NOTE — Progress Notes (Signed)
PT Cancellation Note  Patient Details Name: [name] MRN: [id number] DOB: Mar 10, 1941   Cancelled Treatment:    Reason Eval/Treat Not Completed: Other (comment).  Pt received sitting in chair eating lunch.  Pt politely requests for PT to return at a later time.  Will attempt to see pt again later today, schedule permitting.   Collie Siad PT, DPT 07/08/2017, 2:28 PM

## 2017-07-09 LAB — CBC
HCT: 38.2 % — ABNORMAL LOW (ref 40.0–52.0)
HEMOGLOBIN: 12.8 g/dL — AB (ref 13.0–18.0)
MCH: 30.7 pg (ref 26.0–34.0)
MCHC: 33.7 g/dL (ref 32.0–36.0)
MCV: 91.2 fL (ref 80.0–100.0)
PLATELETS: 516 10*3/uL — AB (ref 150–440)
RBC: 4.18 MIL/uL — AB (ref 4.40–5.90)
RDW: 13.5 % (ref 11.5–14.5)
WBC: 18.8 10*3/uL — ABNORMAL HIGH (ref 3.8–10.6)

## 2017-07-09 LAB — GLUCOSE, CAPILLARY
GLUCOSE-CAPILLARY: 142 mg/dL — AB (ref 65–99)
GLUCOSE-CAPILLARY: 265 mg/dL — AB (ref 65–99)

## 2017-07-09 MED ORDER — GLIMEPIRIDE 4 MG PO TABS: 4.0000 mg | ORAL_TABLET | ORAL | 11 refills | Status: DC

## 2017-07-09 MED ORDER — POLYETHYLENE GLYCOL 3350 17 G PO PACK: 17.0000 g | PACK | Freq: Every day | ORAL | 0 refills | Status: AC | PRN

## 2017-07-09 MED ORDER — METFORMIN HCL 500 MG PO TABS: 500.0000 mg | ORAL_TABLET | Freq: Two times a day (BID) | ORAL | Status: AC

## 2017-07-09 MED ORDER — LEVOFLOXACIN 500 MG PO TABS: 500.0000 mg | ORAL_TABLET | Freq: Every day | ORAL | 0 refills | Status: AC

## 2017-07-09 MED ORDER — IPRATROPIUM-ALBUTEROL 0.5-2.5 (3) MG/3ML IN SOLN: 3.0000 mL | RESPIRATORY_TRACT | Status: AC | PRN

## 2017-07-09 NOTE — Clinical Social Work Placement (Addendum)
   CLINICAL SOCIAL WORK PLACEMENT  NOTE  Date:  07/09/2017  Patient Details  Name: [name] MRN: [id number] Date of Birth: 30-Apr-1941  Clinical Social Work is seeking post-discharge placement for this patient at the [place] level of care (*CSW will initial, date and re-position this form in  chart as items are completed):  Yes   Patient/family provided with [facility] Work Department's list of facilities offering this level of care within the geographic area requested by the patient (or if unable, by the patient's family).  Yes   Patient/family informed of their freedom to choose among providers that offer the needed level of care, that participate in Medicare, Medicaid or managed care program needed by the patient, have an available bed and are willing to accept the patient.  Yes   Patient/family informed of [facility]'s ownership interest in [facility] and [facility], as well as of the fact that they are under no obligation to receive care at these facilities.  PASRR submitted to EDS on 07/03/17     PASRR number received on 07/03/17     Existing PASRR number confirmed on       FL2 transmitted to all facilities in geographic area requested by pt/family on 07/09/17     FL2 transmitted to all facilities within larger geographic area on       Patient informed that his/her managed care company has contracts with or will negotiate with certain facilities, including the following:        Yes   Patient/family informed of bed offers received.  Patient chooses bed at  [facility] )     Physician recommends and patient chooses bed at      Patient to be transferred to  [facility] ) on 07/09/17.  Patient to be transferred to facility by  [facility] )     Patient family notified on 07/09/17 of transfer.  Name of family member notified:   (Patiet's friend Jackquline Berlin is aware of D/C today. CSW left patient's  relative [employer] a voicemail. ) Patient's sister in law Kalman Shan called CSW back and was made aware of D/C.      PHYSICIAN       Additional Comment:    _______________________________________________ Jania Steinke, Veronia Beets, LCSW 07/09/2017, 11:03 AM

## 2017-07-09 NOTE — Discharge Summary (Signed)
[place] [facility] [place] NAME: [name]    MR#:  [id number]  DATE OF BIRTH:  [birth year]  DATE OF ADMISSION:  06/30/2017 ADMITTING PHYSICIAN: Fritzi Mandes, MD  DATE OF DISCHARGE: 07/09/2017  PRIMARY CARE PHYSICIAN: Monico Blitz, MD    ADMISSION DIAGNOSIS:  Cough [R05] Sepsis, due to unspecified organism [facility]) [A41.9] Diabetic ketoacidosis without coma associated with type 2 diabetes mellitus ([place]) [E11.10] Community acquired pneumonia, unspecified laterality [J18.9]  DISCHARGE DIAGNOSIS:  Active Problems:   Sepsis ([place])   Community acquired pneumonia   Diabetic ketoacidosis without coma associated with type 2 diabetes mellitus ([place])   SECONDARY DIAGNOSIS:   Past Medical History:  Diagnosis Date  . Anxiety   . Arthritis    "hands" (08-24-16)  . BPH (benign prostatic hypertrophy)   . BRBPR (bright red blood per rectum) ~ 01/2016; 10'/18/2017  . CAD (coronary artery disease) 08/24/2016  . Carotid artery occlusion   . Cerebrovascular disease, unspecified   . Cervicalgia   . CHF (congestive heart failure) ([place])   . Chronic kidney disease (CKD), stage III (moderate)    [name] 11/24/2016  . Enlarged prostate   . Hypertension   . Inguinal hernia without mention of obstruction or gangrene, unilateral or unspecified, (not specified as recurrent)   . Myocardial infarction [facility]) 2014; 2015; 2016   "told I'd had the 1st 2 while in the hospital in 2016; didn't even know it" (Aug 24, 2016)  . Other psoriasis   . Pain in limb   . Presence of permanent cardiac pacemaker   . Situational depression    "after my daughter died in 01-05-15" (August 24, 2016)  . Stroke [facility]) 2010; 2014   "right sided weakness since" (11/25/2016)  . Unspecified hemorrhoids without mention of complication   . Unspecified transient cerebral ischemia   . Varicose veins     HOSPITAL COURSE:   76 year old male with past medical history of hypertension, history of coronary  artery disease, BPH, status post pacemaker, history of previous CVA, anxiety who presented to the hospital due to shortness of breath and noted to be in acute respiratory failure with hypoxia secondary to pneumonia.  1. Sepsis- this was secondary to multifocal pneumonia. Patient presented with leukocytosis, tachycardia and chest x-ray findings suggestive of pneumonia. - initially pt. Was treated with IV Ceftriaxone, Zithromax and then narrowed down to Oral Levaquin and being discharged on it.   - Sputum, Blood cultures have remained Negative and pt. Is hemodynamically stable.  WBC count has trended down and can be further followed as outpatient.   2. Acute respiratory failure with hypoxia-secondary to multifocal pneumonia. - pt. Was on hiflo [place] and now has been weaned to [place] at 3-4 L and will be discharged on that.  CT chest obtained during hospital course which showed dense multifocal pneumonia and no evidence of PE.  - pt. Was on IV steroid and now weaned off it and will be discharged on just duonebs as needed.   3.diabetes type 2-this was new onset for the patient. Hemoglobin A1c was close to 8.  - while in the hospital pt. Was on Lantus and SSI but now being discharged on Oral Metformin, Amaryl.  - appreciate Diabetes Coordinator input.    4. HTN - pt. Will cont. His Norvasc, HCTZ.  BP stable.   5. Hyperlipidemia - pt. Will cont. Atorvastatin  6. BPH - pt. Will cont. Finasteride.   DISCHARGE CONDITIONS:   Stable.   CONSULTS OBTAINED:  DRUG ALLERGIES:   Allergies  Allergen Reactions  . Strawberry Extract Shortness Of Breath and Swelling    Lips and throat swelled  . Ace Inhibitors Other (See Comments)    Possible angioedema  . Cardura [Doxazosin Mesylate] Other (See Comments)    Erythema  . Penicillins Rash    Has patient had a PCN reaction causing immediate rash, facial/tongue/throat swelling, SOB or lightheadedness with hypotension: Yes Has patient had a PCN reaction  causing severe rash involving mucus membranes or skin necrosis: No Has patient had a PCN reaction that required hospitalization No Has patient had a PCN reaction occurring within the last 10 years: No If all of the above answers are "NO", then may proceed with Cephalosporin use.    DISCHARGE MEDICATIONS:   Allergies as of 07/09/2017      Reactions   Strawberry Extract Shortness Of Breath, Swelling   Lips and throat swelled   Ace Inhibitors Other (See Comments)   Possible angioedema   Cardura [doxazosin Mesylate] Other (See Comments)   Erythema   Penicillins Rash   Has patient had a PCN reaction causing immediate rash, facial/tongue/throat swelling, SOB or lightheadedness with hypotension: Yes Has patient had a PCN reaction causing severe rash involving mucus membranes or skin necrosis: No Has patient had a PCN reaction that required hospitalization No Has patient had a PCN reaction occurring within the last 10 years: No If all of the above answers are "NO", then may proceed with Cephalosporin use.      Medication List    TAKE these medications   amLODipine 10 MG tablet Commonly known as:  NORVASC Take 10 mg by mouth daily.   aspirin EC 81 MG tablet Take 1 tablet (81 mg total) by mouth daily.   atorvastatin 40 MG tablet Commonly known as:  LIPITOR Take 1 tablet (40 mg total) by mouth daily.   finasteride 5 MG tablet Commonly known as:  PROSCAR Take 5 mg by mouth daily.   glimepiride 4 MG tablet Commonly known as:  AMARYL Take 1 tablet (4 mg total) by mouth every morning.   Glucosamine 500 MG Caps Take 500 mg by mouth daily.   hydrochlorothiazide 25 MG tablet Commonly known as:  HYDRODIURIL Take 25 mg by mouth daily.   ipratropium-albuterol 0.5-2.5 (3) MG/3ML Soln Commonly known as:  DUONEB Take 3 mLs by nebulization every 4 (four) hours as needed.   levofloxacin 500 MG tablet Commonly known as:  LEVAQUIN Take 1 tablet (500 mg total) by mouth daily.    metFORMIN 500 MG tablet Commonly known as:  GLUCOPHAGE Take 1 tablet (500 mg total) by mouth 2 (two) times daily with a meal.   polyethylene glycol packet Commonly known as:  MIRALAX / GLYCOLAX Take 17 g by mouth daily as needed for mild constipation.   vitamin C 500 MG tablet Commonly known as:  ASCORBIC ACID Take 500 mg by mouth daily.   VITAMIN E PO Take 30 Units by mouth daily.            Discharge Care Instructions        Start     Ordered   07/10/17 0000  levofloxacin (LEVAQUIN) 500 MG tablet  Daily    Comments:  Stop date on 07/13/17   07/09/17 0930   07/09/17 0000  polyethylene glycol (MIRALAX / GLYCOLAX) packet  Daily PRN     07/09/17 0930   07/09/17 0000  metFORMIN (GLUCOPHAGE) 500 MG tablet  2 times daily with meals  07/09/17 0934   07/09/17 0000  glimepiride (AMARYL) 4 MG tablet  BH-each morning     07/09/17 0934   07/09/17 0000  Activity as tolerated - No restrictions     07/09/17 0934   07/09/17 0000  Diet - low sodium heart healthy     07/09/17 0934   07/09/17 0000  Diet Carb Modified     07/09/17 0934   07/09/17 0000  ipratropium-albuterol (DUONEB) 0.5-2.5 (3) MG/3ML SOLN  Every 4 hours PRN     07/09/17 0938        DISCHARGE INSTRUCTIONS:   DIET:  Cardiac diet and Diabetic diet  DISCHARGE CONDITION:  Stable  ACTIVITY:  Activity as tolerated  OXYGEN:  Home Oxygen: Yes.     Oxygen Delivery: 3-4 liters/min via Patient connected to nasal cannula oxygen  DISCHARGE LOCATION:  nursing home   If you experience worsening of your admission symptoms, develop shortness of breath, life threatening emergency, suicidal or homicidal thoughts you must seek medical attention immediately by calling 911 or calling your MD immediately  if symptoms less severe.  You Must read complete instructions/literature along with all the possible adverse reactions/side effects for all the Medicines you take and that have been prescribed to you. Take any new  Medicines after you have completely understood and accpet all the possible adverse reactions/side effects.   Please note  You were cared for by a hospitalist during your hospital stay. If you have any questions about your discharge medications or the care you received while you were in the hospital after you are discharged, you can call the unit and asked to speak with the hospitalist on call if the hospitalist that took care of you is not available. Once you are discharged, your primary care physician will handle any further medical issues. Please note that NO REFILLS for any discharge medications will be authorized once you are discharged, as it is imperative that you return to your primary care physician (or establish a relationship with a primary care physician if you do not have one) for your aftercare needs so that they can reassess your need for medications and monitor your lab values.     Today   Shortness of breath much improved.  No fever overnight.  + cough.  No other complaints and will d/c to SNF today.   VITAL SIGNS:  Blood pressure 102/69, pulse 67, temperature (!) 97.5 F (36.4 C), temperature source Oral, resp. rate 14, height 5\' 9"  (1.753 m), weight 95.3 kg (210 lb), SpO2 94 %.  I/O:    Intake/Output Summary (Last 24 hours) at 07/09/17 0938 Last data filed at 07/09/17 0910  Gross per 24 hour  Intake              480 ml  Output             1125 ml  Net             -645 ml    PHYSICAL EXAMINATION:  GENERAL:  76 y.o.-year-old patient lying in the bed with no acute distress.  EYES: Pupils equal, round, reactive to light and accommodation. No scleral icterus. Extraocular muscles intact.  HEENT: Head atraumatic, normocephalic. Oropharynx and nasopharynx clear.  NECK:  Supple, no jugular venous distention. No thyroid enlargement, no tenderness.  LUNGS: Normal breath sounds bilaterally, no wheezing, rales, minimal rhonchi b/l. No use of accessory muscles of respiration.   CARDIOVASCULAR: S1, S2 normal. No murmurs, rubs, or gallops.  ABDOMEN: Soft, non-tender,  non-distended. Bowel sounds present. No organomegaly or mass.  EXTREMITIES: No pedal edema, cyanosis, or clubbing.  NEUROLOGIC: Cranial nerves II through XII are intact. No focal motor or sensory defecits b/l. Globally weak.   PSYCHIATRIC: The patient is alert and oriented x 3.  SKIN: No obvious rash, lesion, or ulcer.   DATA REVIEW:   CBC  Recent Labs Lab 07/09/17 0413  WBC 18.8*  HGB 12.8*  HCT 38.2*  PLT 516*    Chemistries   Recent Labs Lab 07/06/17 0520 07/07/17 0516  NA 135 138  K 4.8 4.4  CL 99* 101  CO2 28 29  GLUCOSE 178* 81  BUN 41* 40*  CREATININE 1.27* 1.24  CALCIUM 8.5* 8.2*  MG 2.1  --     Cardiac Enzymes No results for input(s): TROPONINI in the last 168 hours.  Microbiology Results  Results for orders placed or performed during the hospital encounter of 06/30/17  MRSA PCR Screening     Status: None   Collection Time: 06/30/17  9:02 AM  Result Value Ref Range Status   MRSA by PCR NEGATIVE NEGATIVE Final    Comment:        The GeneXpert MRSA Assay (FDA approved for NASAL specimens only), is one component of a comprehensive MRSA colonization surveillance program. It is not intended to diagnose MRSA infection nor to guide or monitor treatment for MRSA infections.   Blood culture (routine x 2)     Status: Abnormal   Collection Time: 06/30/17  9:39 AM  Result Value Ref Range Status   Specimen Description BLOOD BLOOD RIGHT ARM  Final   Special Requests   Final    BOTTLES DRAWN AEROBIC AND ANAEROBIC Blood Culture results may not be optimal due to an excessive volume of blood received in culture bottles   Culture  Setup Time   Final    Organism ID to follow [place] CRITICAL RESULT CALLED TO, READ BACK BY AND VERIFIED WITH: KAREN HAYES 07/01/17 1354 [place] WITH RESULT    Culture (A)  Final     STAPHYLOCOCCUS SPECIES (COAGULASE NEGATIVE) THE SIGNIFICANCE OF ISOLATING THIS ORGANISM FROM A SINGLE SET OF BLOOD CULTURES WHEN MULTIPLE SETS ARE DRAWN IS UNCERTAIN. PLEASE NOTIFY THE MICROBIOLOGY DEPARTMENT WITHIN ONE WEEK IF SPECIATION AND SENSITIVITIES ARE REQUIRED. Performed at [facility], [place] [address], [place], [place] [address]    Report Status 07/02/2017 FINAL  Final  Blood Culture ID Panel (Reflexed)     Status: Abnormal   Collection Time: 06/30/17  9:39 AM  Result Value Ref Range Status   Enterococcus species NOT DETECTED NOT DETECTED Final   Listeria monocytogenes NOT DETECTED NOT DETECTED Final   Staphylococcus species DETECTED (A) NOT DETECTED Final    Comment: Methicillin (oxacillin) susceptible coagulase negative staphylococcus. Possible blood culture contaminant (unless isolated from more than one blood culture draw or clinical case suggests pathogenicity). No antibiotic treatment is indicated for blood  culture contaminants. CRITICAL RESULT CALLED TO, READ BACK BY AND VERIFIED WITH: KAREN HAYES 07/01/17 1354 [facility]    Staphylococcus aureus NOT DETECTED NOT DETECTED Final   Methicillin resistance NOT DETECTED NOT DETECTED Final   Streptococcus species NOT DETECTED NOT DETECTED Final   Streptococcus agalactiae NOT DETECTED NOT DETECTED Final   Streptococcus pneumoniae NOT DETECTED NOT DETECTED Final   Streptococcus pyogenes NOT DETECTED NOT DETECTED Final   Acinetobacter baumannii NOT DETECTED NOT DETECTED Final   Enterobacteriaceae species NOT DETECTED NOT  DETECTED Final   Enterobacter cloacae complex NOT DETECTED NOT DETECTED Final   Escherichia coli NOT DETECTED NOT DETECTED Final   Klebsiella oxytoca NOT DETECTED NOT DETECTED Final   Klebsiella pneumoniae NOT DETECTED NOT DETECTED Final   Proteus species NOT DETECTED NOT DETECTED Final   Serratia marcescens NOT DETECTED NOT DETECTED Final   Haemophilus influenzae NOT DETECTED NOT DETECTED Final   Neisseria  meningitidis NOT DETECTED NOT DETECTED Final   Pseudomonas aeruginosa NOT DETECTED NOT DETECTED Final   Candida albicans NOT DETECTED NOT DETECTED Final   Candida glabrata NOT DETECTED NOT DETECTED Final   Candida krusei NOT DETECTED NOT DETECTED Final   Candida parapsilosis NOT DETECTED NOT DETECTED Final   Candida tropicalis NOT DETECTED NOT DETECTED Final  Blood culture (routine x 2)     Status: None   Collection Time: 06/30/17  9:41 AM  Result Value Ref Range Status   Specimen Description BLOOD BLOOD LEFT ARM  Final   Special Requests   Final    BOTTLES DRAWN AEROBIC AND ANAEROBIC Blood Culture results may not be optimal due to an excessive volume of blood received in culture bottles   Culture NO GROWTH 5 DAYS  Final   Report Status 07/05/2017 FINAL  Final  Culture, expectorated sputum-assessment     Status: None   Collection Time: 07/01/17 11:30 AM  Result Value Ref Range Status   Specimen Description EXPECTORATED SPUTUM  Final   Special Requests NONE  Final   Sputum evaluation THIS SPECIMEN IS ACCEPTABLE FOR SPUTUM CULTURE  Final   Report Status 07/01/2017 FINAL  Final  Culture, respiratory (NON-Expectorated)     Status: None   Collection Time: 07/01/17 11:30 AM  Result Value Ref Range Status   Specimen Description EXPECTORATED SPUTUM  Final   Special Requests NONE Reflexed from [id number]  Final   Gram Stain   Final    ABUNDANT WBC PRESENT,BOTH PMN AND MONONUCLEAR FEW BUDDING YEAST SEEN RARE GRAM VARIABLE ROD    Culture FEW CANDIDA ALBICANS  Final   Report Status 07/04/2017 FINAL  Final    RADIOLOGY:  No results found.    Management plans discussed with the patient, family and they are in agreement.  CODE STATUS:     Code Status Orders        Start     Ordered   06/30/17 1210  Full code  Continuous     06/30/17 1209    TOTAL TIME TAKING CARE OF THIS PATIENT: 40 minutes.    Henreitta Leber M.D on 07/09/2017 at 9:38 AM  Between 7am to 6pm - Pager -  [phone]  After 6pm go to [email or web address] - Technical brewer [place] [facility]  Office  [phone]  CC: Primary care physician; Monico Blitz, MD

## 2017-07-09 NOTE — Progress Notes (Signed)
Report called to Helene Kelp at [facility]. Madlyn Frankel, RN

## 2017-07-09 NOTE — Progress Notes (Signed)
EMS called for transport. Home medications returned to patient and will be sent with EMS to [facility]. Madlyn Frankel, RN

## 2017-07-09 NOTE — Progress Notes (Signed)
Inpatient Diabetes Program Recommendations  AACE/ADA: New Consensus Statement on Inpatient Glycemic Control (2015)  Target Ranges:  Prepandial:   less than 140 mg/dL      Peak postprandial:   less than 180 mg/dL (1-2 hours)      Critically ill patients:  140 - 180 mg/dL   Lab Results  Component Value Date   GLUCAP 142 (H) 07/09/2017   HGBA1C 8.4 (H) 07/01/2017    Review of Glycemic ControlResults for [name], [name] (MRN [id number]) as of 07/09/2017 09:01  Ref. Range 07/08/2017 07:45 07/08/2017 11:31 07/08/2017 16:46 07/08/2017 20:15 07/09/2017 07:37  Glucose-Capillary Latest Ref Range: 65 - 99 mg/dL 134 (H) 246 (H) 223 (H) 224 (H) 142 (H)   Inpatient Diabetes Program Recommendations:    If appropriate consider adding Metformin 500 mg bid and Amaryl 4 mg daily and d/c Lantus.   Thanks, Adah Perl, RN, BC-ADM Inpatient Diabetes Coordinator Pager [phone] (8a-5p)

## 2017-07-09 NOTE — Progress Notes (Signed)
Patient discharged via EMS to [facility] with [id number]-[id number]. Madlyn Frankel, RN

## 2017-07-09 NOTE — Clinical Social Work Note (Signed)
CSW met with patient and presented bed offers, patient chose to go to [facility] for short term rehab.  CSW contacted [facility] and they can accept patient once he is medically ready for discharge and orders have been received.  Jones Broom. [place], MSW, [place]  07/09/2017 10:43 AM

## 2017-07-09 NOTE — Progress Notes (Addendum)
Patient is medically stable for D/C to [facility] today. Per [facility] admissions coordinator at [facility] patient can come today to room 407. RN will call report to 400 hall nurse and arrange EMS for transport. Clinical Education officer, museum (CSW) sent D/C orders to [facility] via [place]. Patient is aware of above. CSW attempted to contact patient's relative Maxwell Marion however she did not answer and a voicemail was left. CSW contacted patient's friend Jackquline Berlin and made her aware of above. Please reconsult if future social work needs arise. CSW signing off.   Patient's sister in law Kalman Shan called CSW back and was made aware of above.   [employer], LCSW [phone]

## 2017-08-17 ENCOUNTER — Ambulatory Visit (INDEPENDENT_AMBULATORY_CARE_PROVIDER_SITE_OTHER): Payer: Medicare Other | Admitting: Podiatry

## 2017-08-17 ENCOUNTER — Encounter: Payer: Self-pay | Admitting: Podiatry

## 2017-08-17 VITALS — BP 106/63 | HR 94 | Temp 98.9°F | Resp 16

## 2017-08-17 DIAGNOSIS — B351 Tinea unguium: Secondary | ICD-10-CM

## 2017-08-17 DIAGNOSIS — M79674 Pain in right toe(s): Secondary | ICD-10-CM

## 2017-08-17 DIAGNOSIS — M79675 Pain in left toe(s): Secondary | ICD-10-CM

## 2017-08-17 DIAGNOSIS — E119 Type 2 diabetes mellitus without complications: Secondary | ICD-10-CM

## 2017-08-17 NOTE — Progress Notes (Signed)
   Subjective:    Patient ID: [name], male    DOB: 08/12/1941, 76 y.o.   MRN: [id number]  HPI this patient presents the office with chief complaint of pain noted in all of his toes. Due to the nails on both feet.  He says the nails are painful walking and wearing his shoes.  He also relates pain from the bunion on the right foot and the associated second toe hammer toe.  This patient is unable to self treat  . He presents the office today for an evaluation and treatment of his painful feet.    Review of Systems  Musculoskeletal: Positive for gait problem.  Hematological: Bruises/bleeds easily.  All other systems reviewed and are negative.      Objective:   Physical Exam General Appearance  Alert, conversant and in no acute stress.  Vascular  Dorsalis pedis and posterior pulses are palpable  bilaterally.  Capillary return is within normal limits  Bilaterally. Temperature is within normal limits  Bilaterally  Neurologic  Senn-Weinstein monofilament wire test diminished   bilaterally. Muscle power  Within normal limits bilaterally.  Nails Thick disfigured discolored nails with subungual debride bilaterally from hallux to fifth toes bilaterally. No evidence of bacterial infection or drainage bilaterally.  Orthopedic  No limitations of motion of motion feet bilaterally.  No crepitus or effusions noted.  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  Capsulitis sub-2 right foot. Due to the hammertoe  Skin  normotropic skin with no porokeratosis noted bilaterally.  No signs of infections or ulcers noted.          Assessment & Plan:  Onychomycosis  B/L     IE  Debridement of nails  X 10.  RTC 3 months.   [place] Barefoot DPM

## 2017-11-05 ENCOUNTER — Encounter: Payer: Self-pay | Admitting: Emergency Medicine

## 2017-11-05 ENCOUNTER — Other Ambulatory Visit: Payer: Self-pay

## 2017-11-05 DIAGNOSIS — Z79899 Other long term (current) drug therapy: Secondary | ICD-10-CM | POA: Diagnosis not present

## 2017-11-05 DIAGNOSIS — R42 Dizziness and giddiness: Secondary | ICD-10-CM | POA: Diagnosis present

## 2017-11-05 DIAGNOSIS — F419 Anxiety disorder, unspecified: Secondary | ICD-10-CM | POA: Insufficient documentation

## 2017-11-05 DIAGNOSIS — N183 Chronic kidney disease, stage 3 (moderate): Secondary | ICD-10-CM | POA: Insufficient documentation

## 2017-11-05 DIAGNOSIS — Z8673 Personal history of transient ischemic attack (TIA), and cerebral infarction without residual deficits: Secondary | ICD-10-CM | POA: Insufficient documentation

## 2017-11-05 DIAGNOSIS — H9313 Tinnitus, bilateral: Secondary | ICD-10-CM | POA: Diagnosis not present

## 2017-11-05 DIAGNOSIS — I2510 Atherosclerotic heart disease of native coronary artery without angina pectoris: Secondary | ICD-10-CM | POA: Diagnosis not present

## 2017-11-05 DIAGNOSIS — Z7982 Long term (current) use of aspirin: Secondary | ICD-10-CM | POA: Insufficient documentation

## 2017-11-05 DIAGNOSIS — E1122 Type 2 diabetes mellitus with diabetic chronic kidney disease: Secondary | ICD-10-CM | POA: Diagnosis not present

## 2017-11-05 DIAGNOSIS — I252 Old myocardial infarction: Secondary | ICD-10-CM | POA: Insufficient documentation

## 2017-11-05 DIAGNOSIS — Z7984 Long term (current) use of oral hypoglycemic drugs: Secondary | ICD-10-CM | POA: Insufficient documentation

## 2017-11-05 DIAGNOSIS — Z950 Presence of cardiac pacemaker: Secondary | ICD-10-CM | POA: Diagnosis not present

## 2017-11-05 DIAGNOSIS — I509 Heart failure, unspecified: Secondary | ICD-10-CM | POA: Insufficient documentation

## 2017-11-05 DIAGNOSIS — I130 Hypertensive heart and chronic kidney disease with heart failure and stage 1 through stage 4 chronic kidney disease, or unspecified chronic kidney disease: Secondary | ICD-10-CM | POA: Insufficient documentation

## 2017-11-05 LAB — CBC
HEMATOCRIT: 43.3 % (ref 40.0–52.0)
HEMOGLOBIN: 14.5 g/dL (ref 13.0–18.0)
MCH: 30.2 pg (ref 26.0–34.0)
MCHC: 33.5 g/dL (ref 32.0–36.0)
MCV: 90.4 fL (ref 80.0–100.0)
Platelets: 283 10*3/uL (ref 150–440)
RBC: 4.79 MIL/uL (ref 4.40–5.90)
RDW: 13 % (ref 11.5–14.5)
WBC: 12.7 10*3/uL — AB (ref 3.8–10.6)

## 2017-11-05 LAB — URINALYSIS, COMPLETE (UACMP) WITH MICROSCOPIC
BILIRUBIN URINE: NEGATIVE
Bacteria, UA: NONE SEEN
Glucose, UA: NEGATIVE mg/dL
Hgb urine dipstick: NEGATIVE
KETONES UR: NEGATIVE mg/dL
LEUKOCYTES UA: NEGATIVE
Nitrite: NEGATIVE
Protein, ur: NEGATIVE mg/dL
RBC / HPF: NONE SEEN RBC/hpf (ref 0–5)
Specific Gravity, Urine: 1.01 (ref 1.005–1.030)
pH: 6 (ref 5.0–8.0)

## 2017-11-05 LAB — BASIC METABOLIC PANEL
ANION GAP: 9 (ref 5–15)
BUN: 20 mg/dL (ref 6–20)
CO2: 26 mmol/L (ref 22–32)
Calcium: 9.1 mg/dL (ref 8.9–10.3)
Chloride: 102 mmol/L (ref 101–111)
Creatinine, Ser: 1.16 mg/dL (ref 0.61–1.24)
GFR calc Af Amer: >60 mL/min (ref >60)
GFR, EST NON AFRICAN AMERICAN: 58 mL/min — AB (ref >60)
Glucose, Bld: 131 mg/dL — ABNORMAL HIGH (ref 65–99)
POTASSIUM: 3.9 mmol/L (ref 3.5–5.1)
SODIUM: 137 mmol/L (ref 135–145)

## 2017-11-05 LAB — TROPONIN I

## 2017-11-05 LAB — GLUCOSE, CAPILLARY: GLUCOSE-CAPILLARY: 112 mg/dL — AB (ref 65–99)

## 2017-11-05 NOTE — ED Triage Notes (Signed)
Pt to triage via w/c with no distress noted; pt reports onset dizziness and ringing in ears this evening; denies any recent illness or injuries

## 2017-11-06 ENCOUNTER — Emergency Department: Payer: Medicare Other

## 2017-11-06 ENCOUNTER — Emergency Department
Admission: EM | Admit: 2017-11-06 | Discharge: 2017-11-06 | Disposition: A | Discharge status: Home / Self Care | Payer: Medicare Other | Attending: Emergency Medicine | Admitting: Emergency Medicine

## 2017-11-06 DIAGNOSIS — H9313 Tinnitus, bilateral: Secondary | ICD-10-CM

## 2017-11-06 DIAGNOSIS — R42 Dizziness and giddiness: Secondary | ICD-10-CM

## 2017-11-06 LAB — SALICYLATE LEVEL

## 2017-11-06 NOTE — ED Notes (Signed)
Patient transported to CT 

## 2017-11-06 NOTE — ED Notes (Signed)
Pt given drink and graham crackers/peanut butter. Pt waiting patiently in rm.

## 2017-11-06 NOTE — Discharge Instructions (Signed)
As we discussed, your workup was generally reassuring today.  Your symptoms improved without treatment and although we do not know for sure what is causing the ringing in your ears, there is no indication of an emergent medical condition at this time.

## 2017-11-06 NOTE — ED Notes (Signed)
Pt's ride Lorriane Shire), states they will be here in an hr to pick up pt.

## 2017-11-06 NOTE — ED Notes (Signed)

## 2017-11-06 NOTE — ED Provider Notes (Signed)
[facility] Emergency Department Provider Note  ____________________________________________   First MD Initiated Contact with Patient 11/06/17 0142     (approximate)  I have reviewed the triage vital signs and the nursing notes.   HISTORY  Chief Complaint Dizziness    HPI [name] is a 77 y.o. male with extensive chronic medical history including prior MIs, AV pacemaker, and prior CVA who presents for evaluation of dizziness and ringing in his ears.  Symptoms started this evening.  No headache, visual changes, difficulty with ambulation, chest pain, SOB, N/V/D, abdominal pain, nor dysuria.  Takes daily ASA 81 mg and Plavix, does not take Goody or BC powders.  States there is no way he could have taken too much aspirin.  No recent illnesses, but he has been suffering from a red, raised, and mildly itching rash that started on his back and for which he say a doctor a few days ago and started using a cream, but now the rash has been present on his arms and legs for at least 4-5 days as well.  Nothing seems to make it better nor worse.  Dizziness was mild and resolved now.  Ringing in ears was initially severe, but now mild but still present and is bilateral.  Past Medical History:  Diagnosis Date  . Anxiety   . Arthritis    "hands" (2016/08/15)  . BPH (benign prostatic hypertrophy)   . BRBPR (bright red blood per rectum) ~ 01/2016; 10'/18/2017  . CAD (coronary artery disease) 08/15/16  . Carotid artery occlusion   . Cerebrovascular disease, unspecified   . Cervicalgia   . CHF (congestive heart failure) ([place])   . Chronic kidney disease (CKD), stage III (moderate) ([place])    [name] 11/24/2016  . Enlarged prostate   . Hypertension   . Inguinal hernia without mention of obstruction or gangrene, unilateral or unspecified, (not specified as recurrent)   . Myocardial infarction [facility]) 2014; 2015; 2016   "told I'd had the 1st 2 while in the hospital in  2016; didn't even know it" (August 15, 2016)  . Other psoriasis   . Pain in limb   . Presence of permanent cardiac pacemaker   . Situational depression    "after my daughter died in 2014-12-27" (August 15, 2016)  . Stroke [facility] 2010; 2014   "right sided weakness since" (11/25/2016)  . Unspecified hemorrhoids without mention of complication   . Unspecified transient cerebral ischemia   . Varicose veins     Patient Active Problem List   Diagnosis Date Noted  . Sepsis ([place]) 06/30/2017  . Community acquired pneumonia   . Diabetic ketoacidosis without coma associated with type 2 diabetes mellitus ([place])   . Cerebrovascular accident (CVA) ([place])   . Middle cerebral artery stenosis, left   . Slurred speech 11/24/2016  . Hypokalemia 11/24/2016  . GI bleed 08-15-16  . BRBPR (bright red blood per rectum) 2016-08-15  . Acute kidney injury ([place]) 08-15-2016  . Leukocytosis 2016-08-15  . Coronary artery disease due to lipid rich plaque Aug 15, 2016  . CKD (chronic kidney disease) stage 2, GFR 60-89 ml/min 15-Aug-2016  . TIA (transient ischemic attack)   . Anxiety   . Acute GI bleeding   . Essential hypertension   . Complete heart block ([place]) 08/01/2016  . Dizziness 06/02/2015  . LBBB (left bundle branch block) 06/02/2015  . Aftercare following surgery of the circulatory system, [place] 11/10/2013  . Carotid stenosis 10/13/2013  . Occlusion and stenosis of carotid artery without mention  of cerebral infarction 07/04/2013    Past Surgical History:  Procedure Laterality Date  . BACK SURGERY    . CARDIAC CATHETERIZATION N/A 06/06/2015   Procedure: Left Heart Cath and Coronary Angiography;  Surgeon: Wellington Hampshire, MD;  Location: [place] CV LAB;  Service: Cardiovascular;  Laterality: N/A;  . CAROTID ENDARTERECTOMY Right 10-13-13   cea  . COLONOSCOPY    . COLONOSCOPY N/A 08/14/2016   Procedure: COLONOSCOPY;  Surgeon: Manus Gunning, MD;  Location: [facility] ENDOSCOPY;  Service: Gastroenterology;   Laterality: N/A;  . ENDARTERECTOMY Left 07/14/2013   Procedure: ENDARTERECTOMY CAROTID-LEFT;  Surgeon: Serafina Mitchell, MD;  Location: [place];  Service: Vascular;  Laterality: Left;  . ENDARTERECTOMY Right 10/13/2013   Procedure: RIGHT CAROTID ARTERY ENDARTERECTOMY WITH VASCU GUARD PATCH ANGIOPLASTY;  Surgeon: Serafina Mitchell, MD;  Location: [place];  Service: Vascular;  Laterality: Right;  . EP IMPLANTABLE DEVICE N/A 08/01/2016   Procedure: Pacemaker Implant;  Surgeon: Will Meredith Leeds, MD;  Location: [place] CV LAB;  Service: Cardiovascular;  Laterality: N/A;  . INGUINAL HERNIA REPAIR Left   . INSERT / REPLACE / REMOVE PACEMAKER    . [place]   "ruptured disc"; Dr. Amado Coe  . PATCH ANGIOPLASTY Left 07/14/2013   Procedure: PATCH ANGIOPLASTY using Vascu-Guard Vascular Patch;  Surgeon: Serafina Mitchell, MD;  Location: [place];  Service: Vascular;  Laterality: Left;  Marland Kitchen VASECTOMY      Prior to Admission medications   Medication Sig Start Date End Date Taking? Authorizing Provider  amLODipine (NORVASC) 10 MG tablet Take 10 mg by mouth daily.    [provider]  aspirin EC 81 MG tablet Take 1 tablet (81 mg total) by mouth daily. 11/27/16   Donne Hazel, MD  atorvastatin (LIPITOR) 40 MG tablet Take 1 tablet (40 mg total) by mouth daily. 08/02/16   Strader, Fransisco Hertz, PA-C  finasteride (PROSCAR) 5 MG tablet Take 5 mg by mouth daily.    [provider]  glimepiride (AMARYL) 4 MG tablet Take 1 tablet (4 mg total) by mouth every morning. 07/09/17 07/09/18  Henreitta Leber, MD  Glucosamine 500 MG CAPS Take 500 mg by mouth daily.    [provider]  hydrochlorothiazide (HYDRODIURIL) 25 MG tablet Take 25 mg by mouth daily.    [provider]  ipratropium-albuterol (DUONEB) 0.5-2.5 (3) MG/3ML SOLN Take 3 mLs by nebulization every 4 (four) hours as needed. 07/09/17   Henreitta Leber, MD  metFORMIN (GLUCOPHAGE) 500 MG tablet Take 1 tablet (500 mg total)  by mouth 2 (two) times daily with a meal. 07/09/17   Sainani, Belia Heman, MD  polyethylene glycol (MIRALAX / GLYCOLAX) packet Take 17 g by mouth daily as needed for mild constipation. 07/09/17   Henreitta Leber, MD  vitamin C (ASCORBIC ACID) 500 MG tablet Take 500 mg by mouth daily.    [provider]  VITAMIN E PO Take 30 Units by mouth daily.    [provider]    Allergies Strawberry extract; Ace inhibitors; Cardura [doxazosin mesylate]; and Penicillins  Family History  Problem Relation Age of Onset  . Heart attack Father   . Ovarian cancer Daughter   . Colon cancer Neg Hx   . Stomach cancer Neg Hx     Social History Social History   Tobacco Use  . Smoking status: Never Smoker  . Smokeless tobacco: Never Used  Substance Use Topics  . Alcohol use: No  Alcohol/week: 0.0 oz  . Drug use: No    Review of Systems Constitutional: No fever/chills Eyes: No visual changes. ENT: No sore throat. Ringing in ears bilaterally, initially severe but now mild. Cardiovascular: Denies chest pain. Respiratory: Denies shortness of breath. Gastrointestinal: No abdominal pain.  No nausea, no vomiting.  No diarrhea.  No constipation. Genitourinary: Negative for dysuria. Musculoskeletal: Negative for neck pain.  Negative for back pain. Integumentary: red, raised, itching rash on arms, legs, and torso for 4-5 days Neurological: Mild dizziness. Negative for headaches, focal weakness or numbness.   ____________________________________________   PHYSICAL EXAM:  VITAL SIGNS: ED Triage Vitals  Enc Vitals Group     BP 11/05/17 2132 125/74     Pulse Rate 11/05/17 2132 (!) 58     Resp 11/05/17 2132 20     Temp 11/05/17 2132 97.9 F (36.6 C)     Temp Source 11/05/17 2132 Oral     SpO2 11/05/17 2132 97 %     Weight 11/05/17 2133 90.7 kg (200 lb)     Height 11/05/17 2133 1.753 m (5\' 9" )     Head Circumference --      Peak Flow --      Pain Score --      Pain Loc --       Pain Edu? --      Excl. in [place]? --     Constitutional: Alert and oriented. No acute distress. Eyes: Conjunctivae are normal. PERRL. EOMI. No nystagmus Head: Atraumatic. Ears:  Healthy appearing ear canals and TMs bilaterally Nose: No congestion/rhinnorhea. Mouth/Throat: Mucous membranes are moist.  Oropharynx non-erythematous. No rash in mouth or mucosal involvement to suggest SJS. Neck: No stridor.  No meningeal signs.   Cardiovascular: Normal rate, regular rhythm. AV pacemaker in place. Good peripheral circulation. Grossly normal heart sounds. Respiratory: Normal respiratory effort.  No retractions. Lungs CTAB. Gastrointestinal: Soft and nontender. No distention.  Musculoskeletal: No lower extremity tenderness nor edema. No gross deformities of extremities. Neurologic:  Normal speech and language. No gross focal neurologic deficits are appreciated.  Skin:  Skin is warm, dry and intact. Extensive red, raised rash throughout extremities and on chest and back.  Non-specific, not consistent with erythema multiforme, erythema nodosum, SJS/TEN, contact dermatitis, etc.  No evidence of cellulitis/bacterial infection. Psychiatric: Mood and affect are normal. Speech and behavior are normal.  ____________________________________________   LABS (all labs ordered are listed, but only abnormal results are displayed)  Labs Reviewed  BASIC METABOLIC PANEL - Abnormal; Notable for the following components:      Result Value   Glucose, Bld 131 (*)    GFR calc non Af Amer 58 (*)    All other components within normal limits  CBC - Abnormal; Notable for the following components:   WBC 12.7 (*)    All other components within normal limits  URINALYSIS, COMPLETE (UACMP) WITH MICROSCOPIC - Abnormal; Notable for the following components:   Squamous Epithelial / LPF 0-5 (*)    All other components within normal limits  GLUCOSE, CAPILLARY - Abnormal; Notable for the following components:    Glucose-Capillary 112 (*)    All other components within normal limits  TROPONIN I  SALICYLATE LEVEL  CBG MONITORING, ED   ____________________________________________  EKG  ED ECG REPORT I, Hinda Kehr, the attending physician, personally viewed and interpreted this ECG.  Date: 11/05/2017 EKG Time: 21: 38 Rate: 60 Rhythm: AV dual-paced rhythm QRS Axis: AV dual-paced rhythm Intervals: AV dual-paced rhythm ST/T Wave  abnormalities: AV dual-paced rhythm Narrative Interpretation: no evidence of acute ischemia   ____________________________________________  RADIOLOGY   Ct Head Wo Contrast  Result Date: 11/06/2017 CLINICAL DATA:  77 year old male with ataxia. EXAM: CT HEAD WITHOUT CONTRAST TECHNIQUE: Contiguous axial images were obtained from the base of the skull through the vertex without intravenous contrast. COMPARISON:  Head CT dated 11/24/2016 FINDINGS: Brain: There is moderate to age-related atrophy and chronic microvascular ischemic changes. There is no acute intracranial hemorrhage. No mass effect or midline shift. No extra-axial fluid collection. Vascular: Asymmetric slightly higher attenuation of the right MCA appears similar to prior CT. If there is clinical concern for acute ischemia MRI may provide better evaluation. Skull: Normal. Negative for fracture or focal lesion. Sinuses/Orbits: No acute finding. Other: None IMPRESSION: 1. No acute intracranial hemorrhage. 2. Moderate age-related atrophy and chronic microvascular ischemic changes. 3. Asymmetric high attenuating right MCA similar to prior CT. If there is high clinical concern for acute ischemia, MRI may provide better evaluation. Electronically Signed   By: Anner Crete [name]   On: 11/06/2017 03:10    ____________________________________________   PROCEDURES  Critical Care performed: No   Procedure(s) performed:   Procedures   ____________________________________________   INITIAL IMPRESSION /  ASSESSMENT AND PLAN / ED COURSE  As part of my medical decision making, I reviewed the following data within the [place] notes reviewed and incorporated, Labs reviewed , EKG interpreted  and [place] chart reviewed    Differential diagnosis for dizziness/tinnitus includes, but is not limited to, CVA/TIA, ASA overdose, peripheral vertigo, cranial nerve neuritis, non-specific infectious process, ACS.  Differential for rash includes the rashes specifically mentioned above as well as drug reaction, environmental exposure, etc.    However, the patient is well-appearing and in NAD with reassuring lab work.  Symptoms have improved and patient is no longer dizzy.  ASA level was normal.  I will obtain a CT scan of the head to rule out any acute changes that would suggest CVA; I cannot obtain an MRI given his pacemaker but I also have a low clinical suspicion.  Patient is comfortable with the plan to follow-up as an outpatient if the results are reassuring tonight.  We discussed his rash in detail and I am reluctant to order additional medications including prednisone for his rash given his extensive chronic medical history including diabetes; I am concerned I will cause the problem to be worse and it is been present for for 5 days and he has seen his primary care doctor already.  He has a follow-up appointment scheduled and I encouraged him to do so and he agrees with the plan.  Clinical Course as of Nov 06 602  Fri Nov 06, 2017  9024 Changes on the CT scan are similar to prior CT.  Patient is not eligible for MRI given his pacemaker.  I have a low clinical suspicion for CVA based on his symptoms and he dual antiplatelet therapy already.  No indication for further investigation at this time. CT Head Wo Contrast [CF]  0973 Salicylate Lvl: <5.3 [CF]    Clinical Course User Index [CF] Hinda Kehr, MD    ____________________________________________  FINAL CLINICAL IMPRESSION(S) / ED  DIAGNOSES  Final diagnoses:  Dizziness  Tinnitus of both ears     MEDICATIONS GIVEN DURING THIS VISIT:  Medications - No data to display   ED Discharge Orders    None       Note:  This document was  prepared using Systems analyst and may include unintentional dictation errors.    Hinda Kehr, MD 11/06/17 [phone]

## 2017-11-06 NOTE — ED Notes (Signed)
Pt's ride is here, pt wheeled to lobby and placed in car. Pt in NAD at this time.

## 2017-11-19 ENCOUNTER — Ambulatory Visit: Payer: Medicare Other | Admitting: Podiatry

## 2017-12-01 ENCOUNTER — Encounter: Payer: Self-pay | Admitting: *Deleted

## 2017-12-08 ENCOUNTER — Encounter: Payer: Self-pay | Admitting: Internal Medicine

## 2017-12-08 ENCOUNTER — Ambulatory Visit (INDEPENDENT_AMBULATORY_CARE_PROVIDER_SITE_OTHER): Payer: Medicare Other | Admitting: Internal Medicine

## 2017-12-08 ENCOUNTER — Encounter: Payer: Medicare Other | Admitting: Internal Medicine

## 2017-12-08 VITALS — BP 110/60 | HR 60 | Ht 69.0 in | Wt 198.8 lb

## 2017-12-08 DIAGNOSIS — I442 Atrioventricular block, complete: Secondary | ICD-10-CM | POA: Diagnosis not present

## 2017-12-08 DIAGNOSIS — Z950 Presence of cardiac pacemaker: Secondary | ICD-10-CM

## 2017-12-08 MED ORDER — APIXABAN 5 MG PO TABS: 5.0000 mg | ORAL_TABLET | Freq: Two times a day (BID) | ORAL | 11 refills | Status: DC

## 2017-12-08 NOTE — Patient Instructions (Signed)
Medication Instructions: - Your physician has recommended you make the following change in your medication:  1) STOP aspirin 2) START Eliquis 5 mg -take 1 tablet by mouth TWICE daily  Labwork: - none ordered  Procedures/Testing: - none ordered  Follow-Up: - Your physician wants you to follow-up in: 6 months with the [place] 1 year with Dr. Caryl Comes. You will receive a reminder letter in the mail two months in advance. If you don't receive a letter, please call our office to schedule the follow-up appointment.   Any Additional Special Instructions Will Be Listed Below (If Applicable).     If you need a refill on your cardiac medications before your next appointment, please call your pharmacy.

## 2017-12-08 NOTE — Progress Notes (Signed)
Patient Care Team: Monico Blitz, MD as PCP - General (Internal Medicine)   HPI  [name] is a 77 y.o. male Seen for complete heart block for which he underwent Medtronic pacemaker implantation 10/17 [facility])  He has a history of prior strokes.  He was seen in the ER 1/19 for dizziness 9/18 he had been hospitalized for sepsis without an identified organism.  It was also noted to have pneumonia  No history of palpitations but atrial fibrillation was detected on his device see below  He lives in assisted living.  Denies shortness of breath or chest pain or edema.  Thromboembolic risk factors ( age  -2, HTN-1, TIA/CVA-2, DM-1, Vasc dis-1 ) for a CHADSVASc Score of 7   Records and Results Reviewed hospital records   Past Medical History:  Diagnosis Date  . Anxiety   . Arthritis    "hands" (2016/09/06)  . BPH (benign prostatic hypertrophy)   . BRBPR (bright red blood per rectum) ~ 01/2016; 10'/18/2017  . CAD (coronary artery disease) 09/06/16  . Carotid artery occlusion   . Cerebrovascular disease, unspecified   . Cervicalgia   . CHF (congestive heart failure) ([place])   . Chronic kidney disease (CKD), stage III (moderate) ([place])    [name] 11/24/2016  . Enlarged prostate   . Hypertension   . Inguinal hernia without mention of obstruction or gangrene, unilateral or unspecified, (not specified as recurrent)   . Myocardial infarction [facility]) 2014; 2015; 2016   "told I'd had the 1st 2 while in the hospital in 2016; didn't even know it" (06-Sep-2016)  . Other psoriasis   . Pain in limb   . Presence of permanent cardiac pacemaker   . Situational depression    "after my daughter died in Jan 18, 2015" (09-06-2016)  . Stroke [facility]) 2010; 2014   "right sided weakness since" (11/25/2016)  . Unspecified hemorrhoids without mention of complication   . Unspecified transient cerebral ischemia   . Varicose veins     Past Surgical History:  Procedure Laterality Date  . BACK SURGERY     . CARDIAC CATHETERIZATION N/A 06/06/2015   Procedure: Left Heart Cath and Coronary Angiography;  Surgeon: Wellington Hampshire, MD;  Location: [place] CV LAB;  Service: Cardiovascular;  Laterality: N/A;  . CAROTID ENDARTERECTOMY Right 10-13-13   cea  . COLONOSCOPY    . COLONOSCOPY N/A 08/14/2016   Procedure: COLONOSCOPY;  Surgeon: Manus Gunning, MD;  Location: [facility] ENDOSCOPY;  Service: Gastroenterology;  Laterality: N/A;  . ENDARTERECTOMY Left 07/14/2013   Procedure: ENDARTERECTOMY CAROTID-LEFT;  Surgeon: Serafina Mitchell, MD;  Location: [place];  Service: Vascular;  Laterality: Left;  . ENDARTERECTOMY Right 10/13/2013   Procedure: RIGHT CAROTID ARTERY ENDARTERECTOMY WITH VASCU GUARD PATCH ANGIOPLASTY;  Surgeon: Serafina Mitchell, MD;  Location: [place];  Service: Vascular;  Laterality: Right;  . EP IMPLANTABLE DEVICE N/A 08/01/2016   Procedure: Pacemaker Implant;  Surgeon: Will Meredith Leeds, MD;  Location: [place] CV LAB;  Service: Cardiovascular;  Laterality: N/A;  . INGUINAL HERNIA REPAIR Left   . INSERT / REPLACE / REMOVE PACEMAKER    . [place]   "ruptured disc"; Dr. Amado Coe  . PATCH ANGIOPLASTY Left 07/14/2013   Procedure: PATCH ANGIOPLASTY using Vascu-Guard Vascular Patch;  Surgeon: Serafina Mitchell, MD;  Location: [place];  Service: Vascular;  Laterality: Left;  Marland Kitchen VASECTOMY      Current Outpatient Medications  Medication Sig Dispense Refill  .  amLODipine (NORVASC) 10 MG tablet Take 10 mg by mouth daily.    Marland Kitchen aspirin EC 81 MG tablet Take 1 tablet (81 mg total) by mouth daily. 30 tablet 0  . atorvastatin (LIPITOR) 40 MG tablet Take 1 tablet (40 mg total) by mouth daily. 30 tablet 6  . finasteride (PROSCAR) 5 MG tablet Take 5 mg by mouth daily.    Marland Kitchen glimepiride (AMARYL) 4 MG tablet Take 1 tablet (4 mg total) by mouth every morning. 30 tablet 11  . Glucosamine 500 MG CAPS Take 500 mg by mouth daily.    Marland Kitchen ipratropium-albuterol (DUONEB) 0.5-2.5 (3) MG/3ML SOLN  Take 3 mLs by nebulization every 4 (four) hours as needed. 360 mL   . losartan (COZAAR) 25 MG tablet Take 25 mg by mouth daily.    . metFORMIN (GLUCOPHAGE) 500 MG tablet Take 1 tablet (500 mg total) by mouth 2 (two) times daily with a meal.    . polyethylene glycol (MIRALAX / GLYCOLAX) packet Take 17 g by mouth daily as needed for mild constipation. 14 each 0  . vitamin C (ASCORBIC ACID) 500 MG tablet Take 500 mg by mouth daily.    Marland Kitchen VITAMIN E PO Take 30 Units by mouth daily.     No current facility-administered medications for this visit.     Allergies  Allergen Reactions  . Strawberry Extract Shortness Of Breath and Swelling    Lips and throat swelled  . Ace Inhibitors Other (See Comments)    Possible angioedema  . Cardura [Doxazosin Mesylate] Other (See Comments)    Erythema  . Penicillins Rash    Has patient had a PCN reaction causing immediate rash, facial/tongue/throat swelling, SOB or lightheadedness with hypotension: Yes Has patient had a PCN reaction causing severe rash involving mucus membranes or skin necrosis: No Has patient had a PCN reaction that required hospitalization No Has patient had a PCN reaction occurring within the last 10 years: No If all of the above answers are "NO", then may proceed with Cephalosporin use.      Review of Systems negative except from HPI and PMH  Physical Exam BP 110/60 (BP Location: Left Arm, Patient Position: Sitting, Cuff Size: Normal)   Pulse 60   Ht 5\' 9"  (1.753 m)   Wt 198 lb 12 oz (90.2 kg)   BMI 29.35 kg/m  Well developed and well nourished in no acute distress HENT normal E scleral and icterus clear Neck Supple JVP flat; carotids brisk and full Clear to ausculation Device pocket well healed; without hematoma or erythema.  There is no tethering Regular rate and rhythm, no murmurs gallops or rub Soft with active bowel sounds No clubbing cyanosis  Edema Alert and oriented, grossly normal motor and sensory function Skin  Warm and Dry  ECG demonstrates AV pacing  Assessment and  Plan  Complete heart block  Pacemaker-Medtronic  Atrial fibrillation-paroxysmal  Diabetes  The patient is at high risk for recurrent stroke in the context of atrial fibrillation.  We will discontinue his aspirin and begin him on Eliquis.  Blood pressure is reasonably controlled.  Device function is normal.  We spent more than 50% of our >25 min visit in face to face counseling regarding the above     Current medicines are reviewed at length with the patient today .  The patient does not  have concerns regarding medicines.

## 2017-12-11 LAB — CUP PACEART INCLINIC DEVICE CHECK
Implantable Lead Implant Date: 20171006
Implantable Lead Implant Date: 20171006
Implantable Lead Location: 753859
Implantable Lead Location: 753860
Implantable Pulse Generator Implant Date: 20171006
MDC IDC SESS DTM: 20190215173243

## 2017-12-14 ENCOUNTER — Encounter: Payer: Self-pay | Admitting: *Deleted

## 2017-12-14 ENCOUNTER — Ambulatory Visit: Payer: Medicare Other | Admitting: Podiatry

## 2017-12-14 ENCOUNTER — Encounter: Payer: Self-pay | Admitting: Podiatry

## 2017-12-14 DIAGNOSIS — B351 Tinea unguium: Secondary | ICD-10-CM | POA: Diagnosis not present

## 2017-12-14 DIAGNOSIS — M79675 Pain in left toe(s): Secondary | ICD-10-CM

## 2017-12-14 DIAGNOSIS — E119 Type 2 diabetes mellitus without complications: Secondary | ICD-10-CM

## 2017-12-14 DIAGNOSIS — M79674 Pain in right toe(s): Secondary | ICD-10-CM

## 2017-12-14 NOTE — Progress Notes (Addendum)
Complaint:  Visit Type: Patient returns to my office for continued preventative foot care services. Complaint: Patient states" my nails have grown long and thick and become painful to walk and wear shoes" Patient has been diagnosed with DM with no foot complications. The patient presents for preventative foot care services. No changes to ROS  Podiatric Exam: Vascular: dorsalis pedis and posterior tibial pulses are palpable bilateral. Capillary return is immediate. Temperature gradient is WNL. Skin turgor WNL  Sensorium: Diminished  Semmes Weinstein monofilament test. Normal tactile sensation bilaterally. Nail Exam: Pt has thick disfigured discolored nails with subungual debris noted bilateral entire nail hallux through fifth toenails Ulcer Exam: There is no evidence of ulcer or pre-ulcerative changes or infection. Orthopedic Exam: Muscle tone and strength are WNL. No limitations in general ROM. No crepitus or effusions noted. Foot type and digits show no abnormalities. Severe HAV  B/L   Skin: No Porokeratosis. No infection or ulcers.  Pinch callus  B/L  Diagnosis:  Onychomycosis, , Pain in right toe, pain in left toes  Treatment & Plan Procedures and Treatment: Consent by patient was obtained for treatment procedures.   Debridement of mycotic and hypertrophic toenails, 1 through 5 bilateral and clearing of subungual debris. No ulceration, no infection noted.  Return Visit-Office Procedure: Patient instructed to return to the office for a follow up visit 3 months for continued evaluation and treatment.    Gardiner Barefoot DPM

## 2017-12-16 ENCOUNTER — Ambulatory Visit: Payer: Medicare Other | Admitting: Anesthesiology

## 2017-12-16 ENCOUNTER — Ambulatory Visit
Admission: RE | Admit: 2017-12-16 | Discharge: 2017-12-16 | Disposition: A | Discharge status: Home / Self Care | Payer: Medicare Other | Source: Ambulatory Visit | Attending: Ophthalmology | Admitting: Ophthalmology

## 2017-12-16 ENCOUNTER — Encounter: Payer: Self-pay | Admitting: Emergency Medicine

## 2017-12-16 ENCOUNTER — Encounter
Admission: RE | Disposition: A | Discharge status: Home / Self Care | Payer: Self-pay | Source: Ambulatory Visit | Attending: Ophthalmology

## 2017-12-16 DIAGNOSIS — Z880 Allergy status to penicillin: Secondary | ICD-10-CM | POA: Insufficient documentation

## 2017-12-16 DIAGNOSIS — I252 Old myocardial infarction: Secondary | ICD-10-CM | POA: Diagnosis not present

## 2017-12-16 DIAGNOSIS — F418 Other specified anxiety disorders: Secondary | ICD-10-CM | POA: Diagnosis not present

## 2017-12-16 DIAGNOSIS — I509 Heart failure, unspecified: Secondary | ICD-10-CM | POA: Diagnosis not present

## 2017-12-16 DIAGNOSIS — Z950 Presence of cardiac pacemaker: Secondary | ICD-10-CM | POA: Diagnosis not present

## 2017-12-16 DIAGNOSIS — H2511 Age-related nuclear cataract, right eye: Secondary | ICD-10-CM | POA: Diagnosis present

## 2017-12-16 DIAGNOSIS — M1990 Unspecified osteoarthritis, unspecified site: Secondary | ICD-10-CM | POA: Diagnosis not present

## 2017-12-16 DIAGNOSIS — N289 Disorder of kidney and ureter, unspecified: Secondary | ICD-10-CM | POA: Insufficient documentation

## 2017-12-16 DIAGNOSIS — E7800 Pure hypercholesterolemia, unspecified: Secondary | ICD-10-CM | POA: Insufficient documentation

## 2017-12-16 DIAGNOSIS — R2243 Localized swelling, mass and lump, lower limb, bilateral: Secondary | ICD-10-CM | POA: Diagnosis not present

## 2017-12-16 DIAGNOSIS — E1136 Type 2 diabetes mellitus with diabetic cataract: Secondary | ICD-10-CM | POA: Insufficient documentation

## 2017-12-16 DIAGNOSIS — I110 Hypertensive heart disease with heart failure: Secondary | ICD-10-CM | POA: Insufficient documentation

## 2017-12-16 HISTORY — PX: CATARACT EXTRACTION W/PHACO: SHX586

## 2017-12-16 LAB — GLUCOSE, CAPILLARY: Glucose-Capillary: 135 mg/dL — ABNORMAL HIGH (ref 65–99)

## 2017-12-16 SURGERY — PHACOEMULSIFICATION, CATARACT, WITH IOL INSERTION
Anesthesia: Monitor Anesthesia Care | Site: Eye | Laterality: Right | Wound class: Clean

## 2017-12-16 MED ORDER — ONDANSETRON HCL 4 MG/2ML IJ SOLN: 4.0000 mg | Freq: Once | INTRAMUSCULAR | Status: DC | PRN

## 2017-12-16 MED ORDER — GLYCOPYRROLATE 0.2 MG/ML IJ SOLN
INTRAMUSCULAR | Status: AC
  Filled 2017-12-16: qty 1

## 2017-12-16 MED ORDER — SODIUM CHLORIDE 0.9 % IV SOLN
INTRAVENOUS | Status: DC
  Administered 2017-12-16: 06:00:00 via INTRAVENOUS

## 2017-12-16 MED ORDER — EPINEPHRINE PF 1 MG/ML IJ SOLN
INTRAMUSCULAR | Status: AC
  Filled 2017-12-16: qty 2

## 2017-12-16 MED ORDER — LIDOCAINE HCL (PF) 4 % IJ SOLN
INTRAMUSCULAR | Status: AC
  Filled 2017-12-16: qty 5

## 2017-12-16 MED ORDER — PROPOFOL 10 MG/ML IV BOLUS
INTRAVENOUS | Status: AC
  Filled 2017-12-16: qty 20

## 2017-12-16 MED ORDER — POVIDONE-IODINE 5 % OP SOLN
OPHTHALMIC | Status: AC
  Filled 2017-12-16: qty 30

## 2017-12-16 MED ORDER — MOXIFLOXACIN HCL 0.5 % OP SOLN
OPHTHALMIC | Status: DC | PRN
  Administered 2017-12-16: 0.2 mL via OPHTHALMIC

## 2017-12-16 MED ORDER — BSS IO SOLN
INTRAOCULAR | Status: DC | PRN
  Administered 2017-12-16: 4 mL via OPHTHALMIC

## 2017-12-16 MED ORDER — SODIUM HYALURONATE 10 MG/ML IO SOLN
INTRAOCULAR | Status: DC | PRN
  Administered 2017-12-16: 0.55 mL via INTRAOCULAR

## 2017-12-16 MED ORDER — BSS IO SOLN
INTRAOCULAR | Status: DC | PRN
  Administered 2017-12-16: 200 mL via INTRAOCULAR

## 2017-12-16 MED ORDER — FENTANYL CITRATE (PF) 100 MCG/2ML IJ SOLN: 25.0000 ug | INTRAMUSCULAR | Status: DC | PRN

## 2017-12-16 MED ORDER — MOXIFLOXACIN HCL 0.5 % OP SOLN
OPHTHALMIC | Status: AC
  Filled 2017-12-16: qty 3

## 2017-12-16 MED ORDER — ARMC OPHTHALMIC DILATING DROPS
1.0000 "application " | OPHTHALMIC | Status: AC
  Administered 2017-12-16 (×3): 1 via OPHTHALMIC

## 2017-12-16 MED ORDER — FENTANYL CITRATE (PF) 100 MCG/2ML IJ SOLN
INTRAMUSCULAR | Status: AC
  Filled 2017-12-16: qty 2

## 2017-12-16 MED ORDER — SODIUM HYALURONATE 23 MG/ML IO SOLN
INTRAOCULAR | Status: DC | PRN
  Administered 2017-12-16: 0.6 mL via INTRAOCULAR

## 2017-12-16 MED ORDER — FENTANYL CITRATE (PF) 100 MCG/2ML IJ SOLN
INTRAMUSCULAR | Status: DC | PRN
  Administered 2017-12-16: 50 ug via INTRAVENOUS

## 2017-12-16 MED ORDER — SODIUM HYALURONATE 23 MG/ML IO SOLN
INTRAOCULAR | Status: AC
  Filled 2017-12-16: qty 0.6

## 2017-12-16 MED ORDER — ARMC OPHTHALMIC DILATING DROPS
OPHTHALMIC | Status: AC
  Administered 2017-12-16: 1 via OPHTHALMIC
  Filled 2017-12-16: qty 0.4

## 2017-12-16 MED ORDER — MOXIFLOXACIN HCL 0.5 % OP SOLN: 1.0000 [drp] | OPHTHALMIC | Status: DC | PRN

## 2017-12-16 MED ORDER — POVIDONE-IODINE 5 % OP SOLN
OPHTHALMIC | Status: DC | PRN
  Administered 2017-12-16: 1 via OPHTHALMIC

## 2017-12-16 SURGICAL SUPPLY — 16 items
DISSECTOR HYDRO NUCLEUS 50X22 (MISCELLANEOUS) ×3 IMPLANT
GLOVE BIO SURGEON STRL SZ8 (GLOVE) ×3 IMPLANT
GLOVE BIOGEL M 6.5 STRL (GLOVE) ×3 IMPLANT
GLOVE SURG LX 7.5 STRW (GLOVE) ×2
GLOVE SURG LX STRL 7.5 STRW (GLOVE) ×1 IMPLANT
GOWN STRL REUS W/ TWL LRG LVL3 (GOWN DISPOSABLE) ×2 IMPLANT
GOWN STRL REUS W/TWL LRG LVL3 (GOWN DISPOSABLE) ×6
LABEL CATARACT MEDS ST (LABEL) ×3 IMPLANT
LENS IOL TECNIS ITEC 21.5 (Intraocular Lens) ×2 IMPLANT
PACK CATARACT (MISCELLANEOUS) ×3 IMPLANT
PACK CATARACT KING (MISCELLANEOUS) ×3 IMPLANT
PACK EYE AFTER SURG (MISCELLANEOUS) ×3 IMPLANT
SOL BSS BAG (MISCELLANEOUS) ×3
SOLUTION BSS BAG (MISCELLANEOUS) ×1 IMPLANT
WATER STERILE IRR 250ML POUR (IV SOLUTION) ×3 IMPLANT
WIPE NON LINTING 3.25X3.25 (MISCELLANEOUS) ×3 IMPLANT

## 2017-12-16 NOTE — Anesthesia Postprocedure Evaluation (Signed)
Anesthesia Post Note  Patient: [name]  Procedure(s) Performed: CATARACT EXTRACTION PHACO AND INTRAOCULAR LENS PLACEMENT ([place]) (Right Eye)  Patient location during evaluation: PACU Anesthesia Type: MAC Level of consciousness: awake, awake and alert and oriented Pain management: pain level controlled Vital Signs Assessment: post-procedure vital signs reviewed and stable Respiratory status: spontaneous breathing Cardiovascular status: blood pressure returned to baseline Postop Assessment: no apparent nausea or vomiting Anesthetic complications: no     Last Vitals:  Vitals:   12/16/17 0613 12/16/17 0814  BP: 126/67 117/65  Pulse: 66 60  Resp: 17 17  Temp: 36.6 C 36.5 C  SpO2: 97% 98%    Last Pain:  Vitals:   12/16/17 0814  TempSrc: Oral                 Hedda Slade

## 2017-12-16 NOTE — Anesthesia Post-op Follow-up Note (Signed)
Anesthesia QCDR form completed.        

## 2017-12-16 NOTE — Progress Notes (Signed)
Chaplain provided prayer and ministerial presence.

## 2017-12-16 NOTE — Discharge Instructions (Signed)
Eye Surgery Discharge Instructions  Expect mild scratchy sensation or mild soreness. DO NOT RUB YOUR EYE!  The day of surgery:  Minimal physical activity, but bed rest is not required  No reading, computer work, or close hand work  No bending, lifting, or straining.  May watch TV  For 24 hours:  No driving, legal decisions, or alcoholic beverages  Safety precautions  Eat anything you prefer: It is better to start with liquids, then soup then solid foods.  _____ Eye patch should be worn until postoperative exam tomorrow.  ____ Solar shield eyeglasses should be worn for comfort in the sunlight/patch while sleeping  Resume all regular medications including aspirin or Coumadin if these were discontinued prior to surgery. You may shower, bathe, shave, or wash your hair. Tylenol may be taken for mild discomfort.  Call your doctor if you experience significant pain, nausea, or vomiting, fever > 101 or other signs of infection. [phone] or [phone] Specific instructions:  Follow-up Information    Eulogio Bear, MD Follow up.   Specialty:  Ophthalmology Why:  February 21 at 1:30pm Contact information: [place] [address] [phone]         AMBULATORY SURGERY  DISCHARGE INSTRUCTIONS   1) The drugs that you were given will stay in your system until tomorrow so for the next 24 hours you should not:  A) Drive an automobile B) Make any legal decisions C) Drink any alcoholic beverage   2) You may resume regular meals tomorrow.  Today it is better to start with liquids and gradually work up to solid foods.  You may eat anything you prefer, but it is better to start with liquids, then soup and crackers, and gradually work up to solid foods.   3) Please notify your doctor immediately if you have any unusual bleeding, trouble breathing, redness and pain at the surgery site, drainage, fever, or pain not relieved by  medication.    4) Additional Instructions:        Please contact your physician with any problems or Same Day Surgery at [phone], Monday through Friday 6 am to 4 pm, or [place] at [facility] number at [phone].

## 2017-12-16 NOTE — Transfer of Care (Signed)
Immediate Anesthesia Transfer of Care Note  Patient: [name]  Procedure(s) Performed: CATARACT EXTRACTION PHACO AND INTRAOCULAR LENS PLACEMENT (IOC) (Right Eye)  Patient Location: PACU  Anesthesia Type:MAC  Level of Consciousness: awake, alert  and oriented  Airway & Oxygen Therapy: Patient Spontanous Breathing  Post-op Assessment: Report given to RN and Post -op Vital signs reviewed and stable  Post vital signs: Reviewed and stable  Last Vitals:  Vitals:   12/16/17 0613 12/16/17 0814  BP: 126/67 117/65  Pulse: 66 60  Resp: 17 17  Temp: 36.6 C 36.5 C  SpO2: 97% 98%    Last Pain:  Vitals:   12/16/17 0814  TempSrc: Oral         Complications: No apparent anesthesia complications

## 2017-12-16 NOTE — H&P (Signed)
The History and Physical notes are on paper, have been signed, and are to be scanned.   I have examined the patient and there are no changes to the H&P.   [name] 12/16/2017 7:31 AM

## 2017-12-16 NOTE — Anesthesia Preprocedure Evaluation (Signed)
Anesthesia Evaluation  Patient identified by MRN, date of birth, ID band Patient awake    Reviewed: Allergy & Precautions, H&P , NPO status , Patient's Chart, lab work & pertinent test results, reviewed documented beta blocker date and time   Airway Mallampati: II  TM Distance: >3 FB Neck ROM: full    Dental no notable dental hx. (+) Teeth Intact   Pulmonary neg pulmonary ROS, pneumonia,    Pulmonary exam normal breath sounds clear to auscultation       Cardiovascular Exercise Tolerance: Poor hypertension, On Medications + CAD, + Past MI, + Peripheral Vascular Disease and +CHF  negative cardio ROS  + dysrhythmias + pacemaker  Rhythm:regular Rate:Normal     Neuro/Psych PSYCHIATRIC DISORDERS Anxiety Depression TIACVA negative neurological ROS  negative psych ROS   GI/Hepatic negative GI ROS, Neg liver ROS,   Endo/Other  negative endocrine ROSdiabetes, Well Controlled, Type 2  Renal/GU Renal disease     Musculoskeletal   Abdominal   Peds  Hematology negative hematology ROS (+)   Anesthesia Other Findings   Reproductive/Obstetrics negative OB ROS                             Anesthesia Physical Anesthesia Plan  ASA: III  Anesthesia Plan: MAC   Post-op Pain Management:    Induction:   PONV Risk Score and Plan:   Airway Management Planned:   Additional Equipment:   Intra-op Plan:   Post-operative Plan:   Informed Consent: I have reviewed the patients History and Physical, chart, labs and discussed the procedure including the risks, benefits and alternatives for the proposed anesthesia with the patient or authorized representative who has indicated his/her understanding and acceptance.     Plan Discussed with: CRNA  Anesthesia Plan Comments:         Anesthesia Quick Evaluation

## 2017-12-16 NOTE — Op Note (Signed)
OPERATIVE NOTE  JMARION CHRISTIANO [id number] 12/16/2017   PREOPERATIVE DIAGNOSIS:  Nuclear sclerotic cataract right eye.  H25.11   POSTOPERATIVE DIAGNOSIS:    Nuclear sclerotic cataract right eye.     PROCEDURE:  Phacoemusification with posterior chamber intraocular lens placement of the right eye   LENS:   Implant Name Type Inv. Item Serial No. Manufacturer Lot No. LRB No. Used  LENS IOL DIOP 21.5 - [id number] Intraocular Lens LENS IOL DIOP 21.5 [id number]  Right 1       PCB00 +21.5   ULTRASOUND TIME: 0 minutes 50.8 seconds.  CDE 7.79   SURGEON:  Benay Pillow, MD, MPH  ANESTHESIOLOGIST: Anesthesiologist: Molli Barrows, MD CRNA: Hedda Slade, CRNA   ANESTHESIA:  Topical with tetracaine drops augmented with 1% preservative-free intracameral lidocaine.  ESTIMATED BLOOD LOSS: less than 1 mL.   COMPLICATIONS:  None.   DESCRIPTION OF PROCEDURE:  The patient was identified in the holding room and transported to the operating room and placed in the supine position under the operating microscope.  The right eye was identified as the operative eye and it was prepped and draped in the usual sterile ophthalmic fashion.   A 1.0 millimeter clear-corneal paracentesis was made at the 10:30 position. 0.5 ml of preservative-free 1% lidocaine with epinephrine was injected into the anterior chamber.  The anterior chamber was filled with Healon 5 viscoelastic.  A 2.4 millimeter keratome was used to make a near-clear corneal incision at the 8:00 position.  A curvilinear capsulorrhexis was made with a cystotome and capsulorrhexis forceps.  Balanced salt solution was used to hydrodissect and hydrodelineate the nucleus.   Phacoemulsification was then used in stop and chop fashion to remove the lens nucleus and epinucleus.  The remaining cortex was then removed using the irrigation and aspiration handpiece. Healon was then placed into the capsular bag to distend it for lens placement.  A lens was  then injected into the capsular bag.  The remaining viscoelastic was aspirated.   Wounds were hydrated with balanced salt solution.  The anterior chamber was inflated to a physiologic pressure with balanced salt solution.   Intracameral vigamox 0.1 mL undiluted was injected into the eye and a drop placed onto the ocular surface.  No wound leaks were noted.  The patient was taken to the recovery room in stable condition without complications of anesthesia or surgery  Benay Pillow 12/16/2017, 8:14 AM

## 2018-02-21 ENCOUNTER — Encounter: Payer: Self-pay | Admitting: Emergency Medicine

## 2018-02-21 ENCOUNTER — Emergency Department
Admission: EM | Admit: 2018-02-21 | Discharge: 2018-02-22 | Payer: Medicare Other | Attending: Emergency Medicine | Admitting: Emergency Medicine

## 2018-02-21 ENCOUNTER — Emergency Department: Payer: Medicare Other

## 2018-02-21 DIAGNOSIS — I509 Heart failure, unspecified: Secondary | ICD-10-CM | POA: Diagnosis not present

## 2018-02-21 DIAGNOSIS — M545 Low back pain, unspecified: Secondary | ICD-10-CM

## 2018-02-21 DIAGNOSIS — Z950 Presence of cardiac pacemaker: Secondary | ICD-10-CM | POA: Diagnosis not present

## 2018-02-21 DIAGNOSIS — K649 Unspecified hemorrhoids: Secondary | ICD-10-CM | POA: Diagnosis not present

## 2018-02-21 DIAGNOSIS — N183 Chronic kidney disease, stage 3 (moderate): Secondary | ICD-10-CM | POA: Diagnosis not present

## 2018-02-21 DIAGNOSIS — I130 Hypertensive heart and chronic kidney disease with heart failure and stage 1 through stage 4 chronic kidney disease, or unspecified chronic kidney disease: Secondary | ICD-10-CM | POA: Diagnosis not present

## 2018-02-21 DIAGNOSIS — I252 Old myocardial infarction: Secondary | ICD-10-CM | POA: Diagnosis not present

## 2018-02-21 DIAGNOSIS — E1122 Type 2 diabetes mellitus with diabetic chronic kidney disease: Secondary | ICD-10-CM | POA: Insufficient documentation

## 2018-02-21 DIAGNOSIS — K922 Gastrointestinal hemorrhage, unspecified: Secondary | ICD-10-CM | POA: Diagnosis present

## 2018-02-21 DIAGNOSIS — Z7984 Long term (current) use of oral hypoglycemic drugs: Secondary | ICD-10-CM | POA: Insufficient documentation

## 2018-02-21 DIAGNOSIS — K644 Residual hemorrhoidal skin tags: Secondary | ICD-10-CM

## 2018-02-21 DIAGNOSIS — I249 Acute ischemic heart disease, unspecified: Secondary | ICD-10-CM | POA: Diagnosis not present

## 2018-02-21 DIAGNOSIS — Z79899 Other long term (current) drug therapy: Secondary | ICD-10-CM | POA: Insufficient documentation

## 2018-02-21 LAB — COMPREHENSIVE METABOLIC PANEL
ALT: 14 U/L — AB (ref 17–63)
AST: 22 U/L (ref 15–41)
Albumin: 4 g/dL (ref 3.5–5.0)
Alkaline Phosphatase: 87 U/L (ref 38–126)
Anion gap: 8 (ref 5–15)
BUN: 27 mg/dL — ABNORMAL HIGH (ref 6–20)
CHLORIDE: 103 mmol/L (ref 101–111)
CO2: 26 mmol/L (ref 22–32)
CREATININE: 1.25 mg/dL — AB (ref 0.61–1.24)
Calcium: 9.1 mg/dL (ref 8.9–10.3)
GFR calc non Af Amer: 53 mL/min — ABNORMAL LOW (ref >60)
Glucose, Bld: 151 mg/dL — ABNORMAL HIGH (ref 65–99)
Potassium: 4.2 mmol/L (ref 3.5–5.1)
SODIUM: 137 mmol/L (ref 135–145)
Total Bilirubin: 0.6 mg/dL (ref 0.3–1.2)
Total Protein: 7.1 g/dL (ref 6.5–8.1)

## 2018-02-21 LAB — CBC
HCT: 39.1 % — ABNORMAL LOW (ref 40.0–52.0)
Hemoglobin: 13 g/dL (ref 13.0–18.0)
MCH: 30.9 pg (ref 26.0–34.0)
MCHC: 33.3 g/dL (ref 32.0–36.0)
MCV: 92.7 fL (ref 80.0–100.0)
PLATELETS: 278 10*3/uL (ref 150–440)
RBC: 4.22 MIL/uL — AB (ref 4.40–5.90)
RDW: 13.7 % (ref 11.5–14.5)
WBC: 12.4 10*3/uL — AB (ref 3.8–10.6)

## 2018-02-21 LAB — TYPE AND SCREEN
ABO/RH(D): O POS
Antibody Screen: NEGATIVE

## 2018-02-21 LAB — LIPASE, BLOOD: Lipase: 46 U/L (ref 11–51)

## 2018-02-21 LAB — PROTIME-INR
INR: 1.39
PROTHROMBIN TIME: 16.9 s — AB (ref 11.4–15.2)

## 2018-02-21 MED ORDER — MORPHINE SULFATE (PF) 4 MG/ML IV SOLN
4.0000 mg | Freq: Once | INTRAVENOUS | Status: AC
  Administered 2018-02-21: 4 mg via INTRAVENOUS
  Filled 2018-02-21: qty 1

## 2018-02-21 MED ORDER — IOPAMIDOL (ISOVUE-370) INJECTION 76%
75.0000 mL | Freq: Once | INTRAVENOUS | Status: AC | PRN
  Administered 2018-02-21: 75 mL via INTRAVENOUS

## 2018-02-21 MED ORDER — ONDANSETRON HCL 4 MG/2ML IJ SOLN
4.0000 mg | Freq: Once | INTRAMUSCULAR | Status: AC
  Administered 2018-02-21: 4 mg via INTRAVENOUS
  Filled 2018-02-21: qty 2

## 2018-02-21 NOTE — ED Notes (Signed)
Patient resting quietly with eyes closed

## 2018-02-21 NOTE — ED Triage Notes (Signed)
Patient brought in by ems from [facility]. Patient with complaint of chronic lower back pain that became worse last night. When patient arrived by ems patient had a moderate amount of bright red bleeding from rectum.

## 2018-02-21 NOTE — ED Provider Notes (Signed)
[facility] Emergency Department Provider Note  ____________________________________________   First MD Initiated Contact with Patient 02/21/18 2028     (approximate)  I have reviewed the triage vital signs and the nursing notes.   HISTORY  Chief Complaint Back Pain and GI Bleeding    HPI [name] is a 77 y.o. male previous history of low back surgery, stroke, coronary artery disease, chronic kidney disease  Patient reports that about 20 years ago he had a slipped disc in his back surgery in his lower back.  Yesterday reports while in bed he started having a sharp pain in his lower back, is worsened over the last day and a half to the point that any movement or trying to walk is because severe pain.  He is able to get up and try to use the bathroom, but reports his back is really really hurting severely with 10 out of 10 sharp pain with any movement of the lower back.  Denies any weakness numbness or tingling associated.  No pain in the chest or upper back.  No pain in the neck.  No nausea or vomiting.  Denies trouble urinating.  Does not have any trouble emptying the bladder.  No fevers or chills.  Reports he normally has some low back pain but it seems to be severely worsen.  Patient also reports he started seeing a small amount of blood in his pants after transferring EMS stretcher.  Reports he has a history of hemorrhoids and will occasionally bleed like this.  Reports he thinks likely a small hemorrhoid that has bled  Past Medical History:  Diagnosis Date  . Anxiety   . Arthritis    "hands" (09-10-2016)  . BPH (benign prostatic hypertrophy)   . BRBPR (bright red blood per rectum) ~ 01/2016; 10'/18/2017  . CAD (coronary artery disease) 09/10/16  . Carotid artery occlusion   . Cerebrovascular disease, unspecified   . Cervicalgia   . CHF (congestive heart failure) ([place])   . Chronic kidney disease (CKD), stage III (moderate) ([place])    [name]  11/24/2016  . Enlarged prostate   . Hypertension   . Inguinal hernia without mention of obstruction or gangrene, unilateral or unspecified, (not specified as recurrent)   . Myocardial infarction [facility]) 2014; 2015; 2016   "told I'd had the 1st 2 while in the hospital in 2016; didn't even know it" (09-10-16)  . Other psoriasis   . Pain in limb   . Presence of permanent cardiac pacemaker   . Situational depression    "after my daughter died in 2015-01-22" (09-10-2016)  . Stroke [facility]) 2010; 2014   "right sided weakness since" (11/25/2016)  . Unspecified hemorrhoids without mention of complication   . Unspecified transient cerebral ischemia   . Varicose veins     Patient Active Problem List   Diagnosis Date Noted  . Sepsis ([place]) 06/30/2017  . Community acquired pneumonia   . Diabetic ketoacidosis without coma associated with type 2 diabetes mellitus ([place])   . Cerebrovascular accident (CVA) ([place])   . Middle cerebral artery stenosis, left   . Slurred speech 11/24/2016  . Hypokalemia 11/24/2016  . GI bleed 09-10-16  . BRBPR (bright red blood per rectum) 2016-09-10  . Acute kidney injury ([place]) 09/10/16  . Leukocytosis Sep 10, 2016  . Coronary artery disease due to lipid rich plaque 2016/09/10  . CKD (chronic kidney disease) stage 2, GFR 60-89 ml/min 2016-09-10  . TIA (transient ischemic attack)   . Anxiety   .  Acute GI bleeding   . Essential hypertension   . Complete heart block ([place]) 08/01/2016  . Dizziness 06/02/2015  . LBBB (left bundle branch block) 06/02/2015  . Aftercare following surgery of the circulatory system, [place] 11/10/2013  . Carotid stenosis 10/13/2013  . Occlusion and stenosis of carotid artery without mention of cerebral infarction 07/04/2013    Past Surgical History:  Procedure Laterality Date  . BACK SURGERY    . CARDIAC CATHETERIZATION N/A 06/06/2015   Procedure: Left Heart Cath and Coronary Angiography;  Surgeon: Wellington Hampshire, MD;  Location: [place] CV  LAB;  Service: Cardiovascular;  Laterality: N/A;  . CAROTID ENDARTERECTOMY Right 10-13-13   cea  . CATARACT EXTRACTION W/PHACO Right 12/16/2017   Procedure: CATARACT EXTRACTION PHACO AND INTRAOCULAR LENS PLACEMENT (IOC);  Surgeon: Eulogio Bear, MD;  Location: [facility] ORS;  Service: Ophthalmology;  Laterality: Right;  [place]: 00:50.8 AP% 15.3 CDE 7.79 Fluid pak lot # [id number]  . COLONOSCOPY    . COLONOSCOPY N/A 08/14/2016   Procedure: COLONOSCOPY;  Surgeon: Manus Gunning, MD;  Location: [facility] ENDOSCOPY;  Service: Gastroenterology;  Laterality: N/A;  . ENDARTERECTOMY Left 07/14/2013   Procedure: ENDARTERECTOMY CAROTID-LEFT;  Surgeon: Serafina Mitchell, MD;  Location: [place];  Service: Vascular;  Laterality: Left;  . ENDARTERECTOMY Right 10/13/2013   Procedure: RIGHT CAROTID ARTERY ENDARTERECTOMY WITH VASCU GUARD PATCH ANGIOPLASTY;  Surgeon: Serafina Mitchell, MD;  Location: [place];  Service: Vascular;  Laterality: Right;  . EP IMPLANTABLE DEVICE N/A 08/01/2016   Procedure: Pacemaker Implant;  Surgeon: Will Meredith Leeds, MD;  Location: [place] CV LAB;  Service: Cardiovascular;  Laterality: N/A;  . INGUINAL HERNIA REPAIR Left   . INSERT / REPLACE / REMOVE PACEMAKER    . [place]   "ruptured disc"; Dr. Amado Coe  . PATCH ANGIOPLASTY Left 07/14/2013   Procedure: PATCH ANGIOPLASTY using Vascu-Guard Vascular Patch;  Surgeon: Serafina Mitchell, MD;  Location: [place];  Service: Vascular;  Laterality: Left;  Marland Kitchen VASCULAR SURGERY    . VASECTOMY      Prior to Admission medications   Medication Sig Start Date End Date Taking? Authorizing Provider  amLODipine (NORVASC) 10 MG tablet Take 10 mg by mouth daily.   Yes [provider]  apixaban (ELIQUIS) 5 MG TABS tablet Take 1 tablet (5 mg total) by mouth 2 (two) times daily. 12/08/17  Yes Deboraha Sprang, MD  finasteride (PROSCAR) 5 MG tablet Take 5 mg by mouth daily.   Yes [provider]  Glucosamine 500 MG CAPS Take  500 mg by mouth daily.   Yes [provider]  ipratropium-albuterol (DUONEB) 0.5-2.5 (3) MG/3ML SOLN Take 3 mLs by nebulization every 4 (four) hours as needed. Patient taking differently: Take 3 mLs by nebulization every 4 (four) hours as needed (wheezing or shortness of breath).  07/09/17  Yes Sainani, Belia Heman, MD  losartan (COZAAR) 50 MG tablet Take 50 mg by mouth daily.   Yes [provider]  metFORMIN (GLUCOPHAGE) 500 MG tablet Take 1 tablet (500 mg total) by mouth 2 (two) times daily with a meal. Patient taking differently: Take 500 mg by mouth daily with breakfast.  07/09/17  Yes Sainani, Belia Heman, MD  mineral oil-hydrophilic petrolatum (AQUAPHOR) ointment Apply 1 application topically 2 (two) times daily as needed for dry skin. Apply to affected area on skin twice daily 1 hour after application of triamcinolone for 4 weeks.   Yes [provider]  ondansetron [facility])  4 MG tablet Take 4 mg by mouth every 6 (six) hours as needed for nausea.   Yes [provider]  polyethylene glycol (MIRALAX / GLYCOLAX) packet Take 17 g by mouth daily as needed for mild constipation. 07/09/17  Yes Henreitta Leber, MD  triamcinolone cream (KENALOG) 0.1 % Apply 1 application topically 2 (two) times daily. Apply to affected area externally twice a day for 4 weeks   Yes [provider]  vitamin C (ASCORBIC ACID) 500 MG tablet Take 500 mg by mouth daily.   Yes [provider]  atorvastatin (LIPITOR) 40 MG tablet Take 1 tablet (40 mg total) by mouth daily. Patient not taking: Reported on 02/21/2018 08/02/16   Erma Heritage, PA-C    Allergies Strawberry extract; Ace inhibitors; Cardura [doxazosin mesylate]; and Penicillins  Family History  Problem Relation Age of Onset  . Heart attack Father   . Ovarian cancer Daughter   . Colon cancer Neg Hx   . Stomach cancer Neg Hx     Social History Social History   Tobacco Use  . Smoking status: Never Smoker  .  Smokeless tobacco: Never Used  Substance Use Topics  . Alcohol use: No    Alcohol/week: 0.0 oz  . Drug use: No    Review of Systems Constitutional: No fever/chills Eyes: No visual changes. ENT: No sore throat. Cardiovascular: Denies chest pain. Respiratory: Denies shortness of breath. Gastrointestinal: No abdominal pain.  No nausea, no vomiting.  No diarrhea.  No constipation. Genitourinary: Negative for dysuria. Musculoskeletal: See HPI Skin: Negative for rash. Neurological: Negative for headaches, focal weakness or numbness.    ____________________________________________   PHYSICAL EXAM:  VITAL SIGNS: ED Triage Vitals  Enc Vitals Group     BP 02/21/18 2023 117/66     Pulse Rate 02/21/18 2023 (!) 59     Resp 02/21/18 2023 18     Temp 02/21/18 2023 98 F (36.7 C)     Temp Source 02/21/18 2023 Oral     SpO2 02/21/18 2023 97 %     Weight 02/21/18 2024 195 lb (88.5 kg)     Height 02/21/18 2024 5\' 9"  (1.753 m)     Head Circumference --      Peak Flow --      Pain Score 02/21/18 2023 10     Pain Loc --      Pain Edu? --      Excl. in [place]? --     Constitutional: Alert and oriented.  Patient resting comfortably in bed, but any movement he seems to demonstrate severe pain when he tries to move about, transfer from stretcher to bed.  Sitting up excruciatingly painful his lower back. Eyes: Conjunctivae are normal. Head: Atraumatic. Nose: No congestion/rhinnorhea. Mouth/Throat: Mucous membranes are moist. Neck: No stridor.   Cardiovascular: Normal rate, regular rhythm. Grossly normal heart sounds.  Good peripheral circulation. Respiratory: Normal respiratory effort.  No retractions. Lungs CTAB. Gastrointestinal: Soft and nontender. No distention.  Patient has a small external hemorrhoid.  There is a small amount of bright red blood about the hemorrhoid region.  Stool is brown and formed, does not appear to be bloody. Musculoskeletal:   No cervical thoracic tenderness.   Patient reports severe pain to palpation of the mid to upper lumbar region.    RIGHT Right upper extremity demonstrates normal strength, good use of all muscles. No edema bruising or contusions of the right shoulder/upper arm, right elbow, right forearm / hand. Full range of  motion of the right right upper extremity without pain. No evidence of trauma. Strong radial pulse. Intact median/ulnar/radial neuro-muscular exam.  LEFT Left upper extremity demonstrates normal strength, good use of all muscles. No edema bruising or contusions of the left shoulder/upper arm, left elbow, left forearm / hand. Full range of motion of the left  upper extremity without pain. No evidence of trauma. Strong radial pulse. Intact median/ulnar/radial neuro-muscular exam.  Lower Extremities  No edema. Normal DP/PT pulses bilateral with good cap refill.  Normal neuro-motor function lower extremities bilateral.  RIGHT Right lower extremity demonstrates normal strength but limited due to pain with attempts to flex at the hip. No edema bruising or contusions of the right hip, right knee, right ankle.No pain on axial loading. No evidence of trauma.  LEFT Left lower extremity demonstrates normal strength but limited due to pain with attempts to flex at the hip. No edema bruising or contusions of the hip,  knee, ankle. No pain on axial loading. No evidence of trauma.   Neurologic:  Normal speech and language. No gross focal neurologic deficits are appreciated.  Skin:  Skin is warm, dry and intact. No rash noted. Psychiatric: Mood and affect are normal. Speech and behavior are normal.  ____________________________________________   LABS (all labs ordered are listed, but only abnormal results are displayed)  Labs Reviewed  CBC - Abnormal; Notable for the following components:      Result Value   WBC 12.4 (*)    RBC 4.22 (*)    HCT 39.1 (*)    All other components within normal limits  COMPREHENSIVE METABOLIC  PANEL - Abnormal; Notable for the following components:   Glucose, Bld 151 (*)    BUN 27 (*)    Creatinine, Ser 1.25 (*)    ALT 14 (*)    GFR calc non Af Amer 53 (*)    All other components within normal limits  PROTIME-INR - Abnormal; Notable for the following components:   Prothrombin Time 16.9 (*)    All other components within normal limits  LIPASE, BLOOD  TYPE AND SCREEN   ____________________________________________  EKG  Reviewed and entered by me at 2032 Heart rate 80 Cures 160 QTC 500 Ventricular pacing, no evidence of acute ischemia ____________________________________________  RADIOLOGY  Ct Angio Abd/pel W And/or Wo Contrast  Result Date: 02/21/2018 CLINICAL DATA:  Patient with chronic lower back pain. Rectal bleeding. Evaluate for dissection. EXAM: CTA ABDOMEN AND PELVIS WITH CONTRAST TECHNIQUE: Multidetector CT imaging of the abdomen and pelvis was performed using the standard protocol during bolus administration of intravenous contrast. Multiplanar reconstructed images and MIPs were obtained and reviewed to evaluate the vascular anatomy. CONTRAST:  43mL ISOVUE-370 IOPAMIDOL (ISOVUE-370) INJECTION 76% COMPARISON:  CT abdomen pelvis 12/16/2009 FINDINGS: VASCULAR Aorta: Normal caliber abdominal aorta. Peripheral calcified atherosclerotic plaque. No evidence for dissection. Celiac: There is narrowing along the proximal superior aspect of the celiac axis is demonstrated best on the sagittal view (image 106; series 9). Distal celiac axis is patent. SMA: Patent.  Mild peripheral atherosclerotic plaque. Renals: 2 right renal arteries are demonstrated in patent. Left renal artery is patent. There is atherosclerotic narrowing at the origin of the left renal artery. IMA: Patent. Review of the MIP images confirms the above findings. NON-VASCULAR Lower chest: Heart is mildly enlarged. Small amount of fluid superior pericardial recess. Coronary arterial vascular calcifications. Dependent  atelectasis within the bilateral lower lobes. No pleural effusion. Hepatobiliary: Liver is normal in size and contour. Gallbladder scratch the  small amount of layering stones within the gallbladder lumen. No intrahepatic or extrahepatic biliary ductal dilatation. Pancreas: Unremarkable Spleen: Unremarkable Adrenals/Urinary Tract: Adrenal glands are normal. Kidneys enhance symmetrically with contrast. Ruled bilateral renal cysts are demonstrated. Additional subcentimeter too small to characterize lesions are demonstrated. No hydronephrosis. Urinary bladder is unremarkable. Stomach/Bowel: Sigmoid colonic diverticulosis. No CT evidence for acute diverticulitis. No evidence for small bowel obstruction. No free fluid or free intraperitoneal air. Normal morphology of the stomach. Lymphatic: Normal caliber abdominal aorta. Peripheral calcified atherosclerotic plaque. Retroaortic left renal vein. No retroperitoneal lymphadenopathy. Reproductive: Prostate is unremarkable. Other: Moderate to large fat containing left inguinal hernia. Musculoskeletal: Lumbar spine degenerative changes. No aggressive or acute appearing osseous lesions. IMPRESSION: VASCULAR 1. 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 2. No evidence for abdominal aortic dissection. NON-VASCULAR 1. No acute process within the abdomen or pelvis. Electronically Signed   By: Lovey Newcomer [name]   On: 02/21/2018 22:23   CT abdomen pelvis reviewed.  Comment of possible median arcuate ligament syndrome.  I am not able to correlate as well as the patient's particular symptoms, and I do not believe that this represents a acute finding that explains his symptomatology today at this time.  No acute process denoted within the abdomen or pelvis  ____________________________________________   PROCEDURES  Procedure(s) performed:  None  Procedures  Critical Care performed: No  ____________________________________________   INITIAL IMPRESSION / ASSESSMENT AND PLAN / ED COURSE  Pertinent labs & imaging results that were available during my care of the patient were reviewed by me and considered in my medical decision making (see chart for details).  Patient presents for evaluation of rather severe lower back pain that seems to localize to the mid lumbar region.  Not associate with acute neurologic symptoms or deficits.  Distal vascular exam is normal.  Upon arrival, bedside ultrasound is very limited due to bowel gas but the small segments of the abdominal aorta that I was able to visualize and Doppler do not demonstrate aneurysmal dilatation.  Differential diagnosis includes herniated disc, compression fracture, musculoskeletal or spinal etiology.  Also consider given the patient's pain potential for aortic dissection, aaa, etc.   Plan to check creatinine proceed with CT abdomen and pelvis dissection protocol.  Post void < 200 ml.  No saddle anesthesia on exam.  Good rectal tone.  Clinical Course as of Feb 22 2347  Sun Feb 21, 2018  2227 CT angiogram results reviewed.  No clear explanation given the patient's clinical system is denoted.  There is no evidence of aneurysm or dissection however.  I do not believe median arcuate ligament syndrome would explain his back pain.  At this point the patient is required 8 mg of morphine for pain control.  We will proceed with MRI of the lumbar and thoracic spine for further evaluation   [MQ]    Clinical Course User Index [MQ] Delman Kitten, MD    ----------------------------------------- 11:23 PM on 02/21/2018 -----------------------------------------  Patient reports persistent but moderately improved pain lower back.  He appears to be resting much more comfortably after second dose of morphine.  He reports a severe pain, his CT scan does not appear to explain his  symptomatology well.  Due to the severity of pain, I feel the next step in evaluation reasonably his MRI of his thoracic and lumbar spine.  However, the patient has a pacemaker  that cannot be accommodated our MRI.  ----------------------------------------- 11:43 PM on 02/21/2018 -----------------------------------------  Discussed patient's pacemaker and MRI request with MRI tech, John, at [facility].  There is some care coordination involved in particular getting the device representative available for the study, but it does appear that there is likely doable at [facility] using their MRI device.  I discussed with the patient and notified him there may be some significant wait time, possibly up to 24 hours to get the MRI completed however patient reports that she is fine.  He reports that due to his pain and the symptoms he is actually willing to wait.  I have discussed with Dr. Carmin Muskrat, he is excepted the patient in transfer from our ER to the [facility] emergency room.  Our plan of care will be to order MRI of the thoracic and lumbar spine for further evaluation of his back pain once he arrives to [facility] and coordination through radiology services to obtain appropriate clearances for MRI given his pacemaker.  Patient is alert oriented.  Agreeable and understanding of plan risks and benefits for transfer. ____________________________________________   FINAL CLINICAL IMPRESSION(S) / ED DIAGNOSES  Final diagnoses:  Acute midline low back pain without sciatica  External hemorrhoid, bleeding      NEW MEDICATIONS STARTED DURING THIS VISIT:  New Prescriptions   No medications on file     Note:  This document was prepared using Dragon voice recognition software and may include unintentional dictation errors.     Delman Kitten, MD 02/22/18 0001

## 2018-02-21 NOTE — ED Notes (Signed)
Patient had 100 ml urine output. Post void bladder scan 43 ml.

## 2018-02-21 NOTE — ED Notes (Signed)
Patient transported to CT 

## 2018-02-22 ENCOUNTER — Emergency Department (HOSPITAL_COMMUNITY): Payer: Medicare Other

## 2018-02-22 ENCOUNTER — Emergency Department (HOSPITAL_COMMUNITY)
Admission: EM | Admit: 2018-02-22 | Discharge: 2018-02-22 | Disposition: A | Discharge status: Home / Self Care | Payer: Medicare Other | Attending: Emergency Medicine | Admitting: Emergency Medicine

## 2018-02-22 DIAGNOSIS — Z0189 Encounter for other specified special examinations: Secondary | ICD-10-CM | POA: Insufficient documentation

## 2018-02-22 DIAGNOSIS — M545 Low back pain: Secondary | ICD-10-CM | POA: Insufficient documentation

## 2018-02-22 DIAGNOSIS — Z950 Presence of cardiac pacemaker: Secondary | ICD-10-CM | POA: Insufficient documentation

## 2018-02-22 MED ORDER — MORPHINE SULFATE (PF) 2 MG/ML IV SOLN
2.0000 mg | Freq: Once | INTRAVENOUS | Status: AC
  Administered 2018-02-22: 2 mg via INTRAVENOUS

## 2018-02-22 MED ORDER — GADOBENATE DIMEGLUMINE 529 MG/ML IV SOLN
20.0000 mL | Freq: Once | INTRAVENOUS | Status: AC | PRN
  Administered 2018-02-22: 20 mL via INTRAVENOUS

## 2018-02-22 MED ORDER — MORPHINE SULFATE (PF) 4 MG/ML IV SOLN
4.0000 mg | INTRAVENOUS | Status: DC | PRN
  Administered 2018-02-22: 4 mg via INTRAVENOUS
  Filled 2018-02-22: qty 1

## 2018-02-22 MED ORDER — APIXABAN 5 MG PO TABS
5.0000 mg | ORAL_TABLET | Freq: Two times a day (BID) | ORAL | Status: DC
  Filled 2018-02-22 (×2): qty 1

## 2018-02-22 MED ORDER — MORPHINE SULFATE (PF) 2 MG/ML IV SOLN
INTRAVENOUS | Status: AC
  Filled 2018-02-22: qty 1

## 2018-02-22 MED ORDER — METFORMIN HCL 500 MG PO TABS: 500.0000 mg | ORAL_TABLET | Freq: Two times a day (BID) | ORAL | Status: DC

## 2018-02-22 NOTE — ED Provider Notes (Signed)
7:56 AM Care assumed from Dr. Christy Gentles.  At time of transfer care, patient is awaiting MRI of the T and L-spine.    Plan of care is to consider discharge if patient's imaging is reassuring and patient is able to safely ambulate.  MRI showed no acute bony abnormalities aside from degenerative disease.  No evidence of cord compression or fracture or dislocation.  Patient was feeling much better and was able to ambulate safely without falling or severe pain.  Patient will follow-up with his PCP and spine doctor.  Patient had no other worsens or concerns and was discharged in good condition.   Clinical Impression: 1. Acute low back pain, unspecified back pain laterality, with sciatica presence unspecified     Disposition: Discharge  Condition: Good  I have discussed the results, Dx and Tx plan with the pt(& family if present). He/she/they expressed understanding and agree(s) with the plan. Discharge instructions discussed at great length. Strict return precautions discussed and pt &/or family have verbalized understanding of the instructions. No further questions at time of discharge.    New Prescriptions   No medications on file    Follow Up: Monico Blitz, MD [place] [address] [phone]     [place] [address] [id number] mc [place]       Edie Vallandingham, Gwenyth Allegra, MD 02/22/18 [phone]

## 2018-02-22 NOTE — ED Provider Notes (Signed)
Patient has been transferred from  [facility] to obtain MRI.  He has a pacemaker in place.  He will require to have the pacemaker rep during the MRI.  I discussed with MRI tech, he thinks it would likely be performed between 8 AM and 10 AM He reports continued left lower back pain, but no other new complaints.  His abdominal exam is unremarkable.  He can move his legs without difficulty He understands that his MRI will be later this morning.  PRN medications for pain have been ordered    Ripley Fraise, MD 02/22/18 0301

## 2018-02-22 NOTE — ED Notes (Signed)
PT AVS reviewed with pt and care giver Lorriane Shire. PT verbalized understanding.

## 2018-02-22 NOTE — ED Provider Notes (Signed)
Ongoing ED care assigned to Dr. Clearnce Hasten.  Patient is pending transfer to [facility] for anticipated MRI of the thoracic and lumbar spine for evaluation of back pain.  Patient is resting comfortably with stable hemodynamics now.  May require additional pain control throughout the ED stay tonight, also had some small amount of rectal bleeding which appears to be consistent with hemorrhoidal bleeding.  Patient agreeable and awaiting transfer to [facility], ED   Delman Kitten, MD 02/22/18 [phone]

## 2018-02-22 NOTE — ED Triage Notes (Addendum)
Pt transfer from [facility]. C/o chronic lower back that became worse. CT showed narrowing. Sent here for MRI due to their machine unable to scan with pacemaker. Rates pain @ 10/10 @ this time. Pt A&O x4.

## 2018-02-22 NOTE — ED Notes (Signed)
Pt ambulatory to bathroom without assistance.

## 2018-02-22 NOTE — Discharge Instructions (Signed)
Your MRI showed no acute bony abnormality.  We found some degenerative disease.  Please follow-up with your PCP and your back doctor for further management.  If any symptoms change or worsen, please return to the nearest emergency department.

## 2018-02-22 NOTE — ED Notes (Signed)
Patient transported to MRI 

## 2018-02-22 NOTE — ED Provider Notes (Signed)
7:08 AM MRI pending at this time Signed out to dr tegeler If MRI negative and can ambulate he can be discharged Home meds ordered    Ripley Fraise, MD 02/22/18 [phone]

## 2018-03-15 ENCOUNTER — Ambulatory Visit (INDEPENDENT_AMBULATORY_CARE_PROVIDER_SITE_OTHER): Payer: Medicare Other | Admitting: Podiatry

## 2018-03-15 ENCOUNTER — Encounter: Payer: Self-pay | Admitting: Podiatry

## 2018-03-15 DIAGNOSIS — E119 Type 2 diabetes mellitus without complications: Secondary | ICD-10-CM | POA: Diagnosis not present

## 2018-03-15 DIAGNOSIS — M79675 Pain in left toe(s): Secondary | ICD-10-CM | POA: Diagnosis not present

## 2018-03-15 DIAGNOSIS — B351 Tinea unguium: Secondary | ICD-10-CM

## 2018-03-15 DIAGNOSIS — D689 Coagulation defect, unspecified: Secondary | ICD-10-CM

## 2018-03-15 DIAGNOSIS — M79674 Pain in right toe(s): Secondary | ICD-10-CM | POA: Diagnosis not present

## 2018-03-15 NOTE — Progress Notes (Signed)
Complaint:  Visit Type: Patient returns to my office for continued preventative foot care services. Complaint: Patient states" my nails have grown long and thick and become painful to walk and wear shoes" Patient has been diagnosed with DM with no foot complications. The patient presents for preventative foot care services. No changes to ROS  Podiatric Exam: Vascular: dorsalis pedis and posterior tibial pulses are palpable bilateral. Capillary return is immediate. Temperature gradient is WNL. Skin turgor WNL  Sensorium: Diminished  Semmes Weinstein monofilament test. Normal tactile sensation bilaterally. Nail Exam: Pt has thick disfigured discolored nails with subungual debris noted bilateral entire nail hallux through fifth toenails Ulcer Exam: There is no evidence of ulcer or pre-ulcerative changes or infection. Orthopedic Exam: Muscle tone and strength are WNL. No limitations in general ROM. No crepitus or effusions noted. Foot type and digits show no abnormalities. Severe HAV  B/L   Skin: No Porokeratosis. No infection or ulcers.  Pinch callus  B/L  Diagnosis:  Onychomycosis, , Pain in right toe, pain in left toes  Treatment & Plan Procedures and Treatment: Consent by patient was obtained for treatment procedures.   Debridement of mycotic and hypertrophic toenails, 1 through 5 bilateral and clearing of subungual debris. No ulceration, no infection noted.  Return Visit-Office Procedure: Patient instructed to return to the office for a follow up visit 3 months for continued evaluation and treatment.    Gardiner Barefoot DPM

## 2018-06-21 ENCOUNTER — Ambulatory Visit: Payer: Medicare Other | Admitting: Podiatry

## 2018-10-12 IMAGING — MR MR LUMBAR SPINE WO/W CM
6 of 16 series · 18 of 48 positions shown · IV contrast (multihance)
Comparison: CT abdomen pelvis dated February 21, 2018. CT chest dated
July 03, 2017.

CLINICAL DATA: Chronic back pain, acutely worsening.

EXAM:
MRI THORACIC AND LUMBAR SPINE WITHOUT AND WITH CONTRAST
TECHNIQUE: Multiplanar and multiecho pulse sequences of the thoracic and lumbar
spine were obtained without and with intravenous contrast.
CONTRAST:  20mL MULTIHANCE GADOBENATE DIMEGLUMINE 529 MG/ML IV SOLN

[Series 4: T2 · sagittal · 3.0mm · 0.62mm/px · 1 of 13 slices shown (1 of 4)]
[im 1/13]
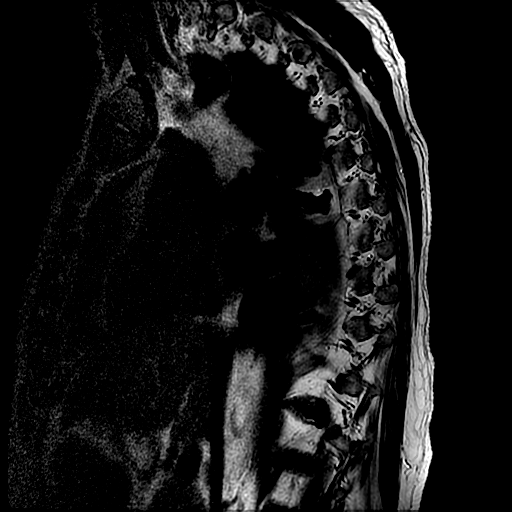

[Series 5: T1 · sagittal · 3.0mm · 0.62mm/px · 1 of 13 slices shown (1 of 2)]
[im 1/13]
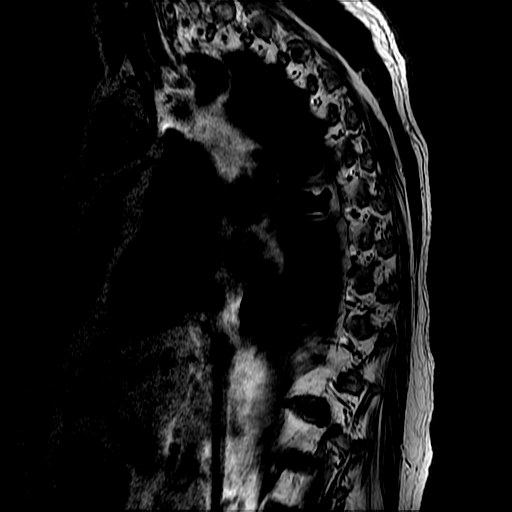

[Series 7: T2 · axial · 4.0mm · 0.43mm/px · z∈[-211,-19]mm · 4 of 39 slices shown (2 of 4)]
[im 1/39]
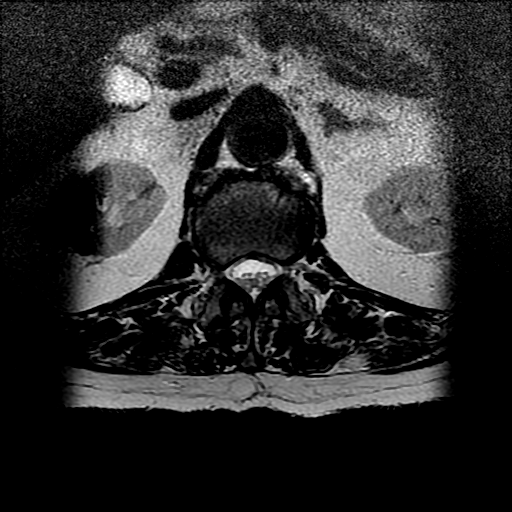
[im 13/39]
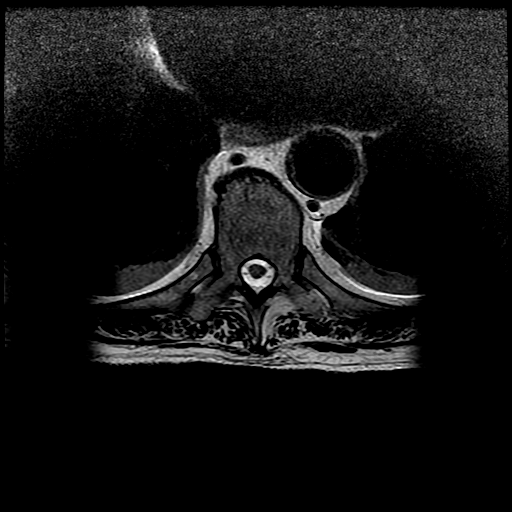
[im 26/39]
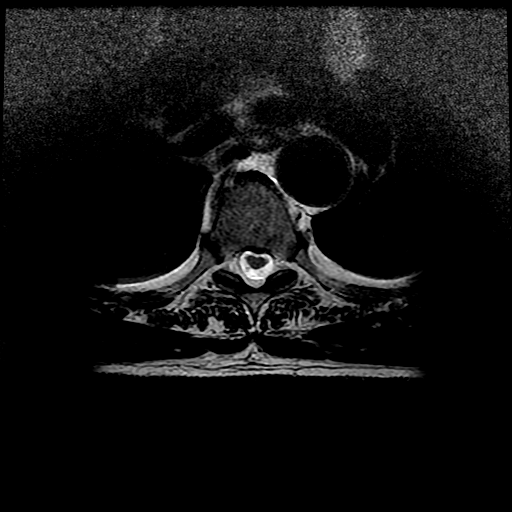
[im 39/39]
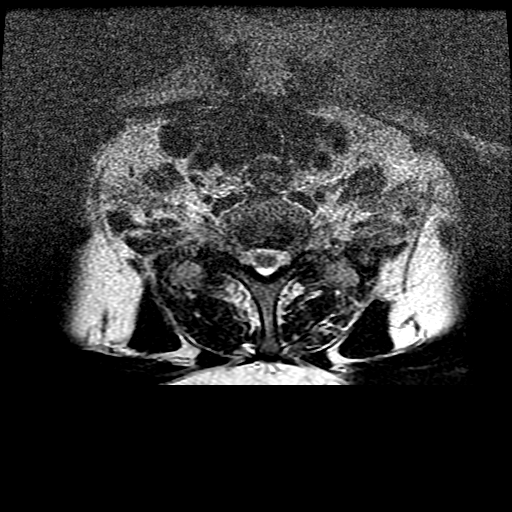

[Series 9: T1 · axial · non-contrast · 4.0mm · 0.43mm/px · z∈[-211,-19]mm · 5 of 39 slices shown (2 of 2)]
[im 1/39]
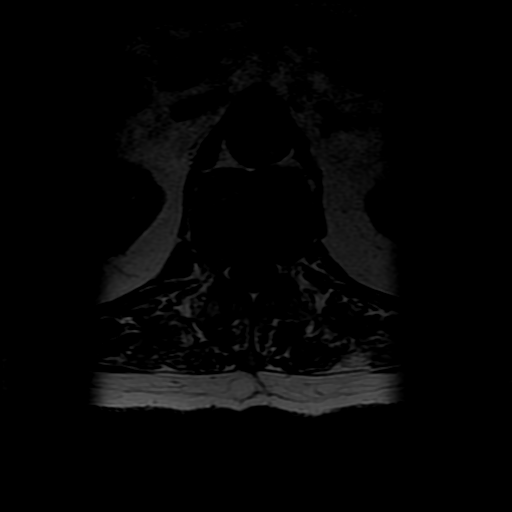
[im 10/39]
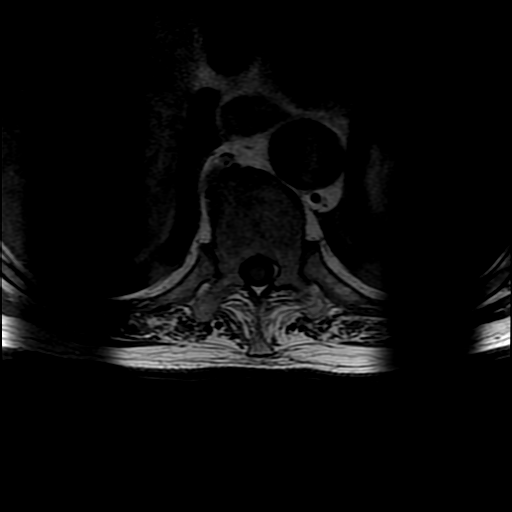
[im 20/39]
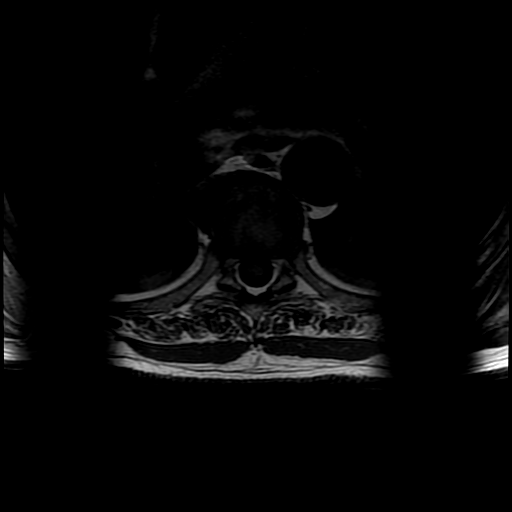
[im 29/39]
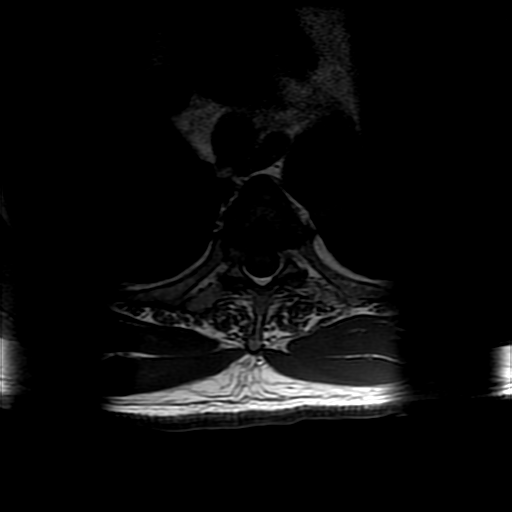
[im 39/39]
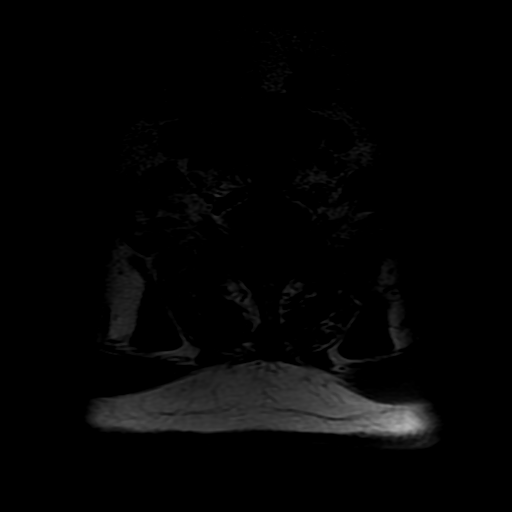

[Series 12: T2 · sagittal · 4.0mm · 0.55mm/px · 2 of 13 slices shown (3 of 4)]
[im 1/13]
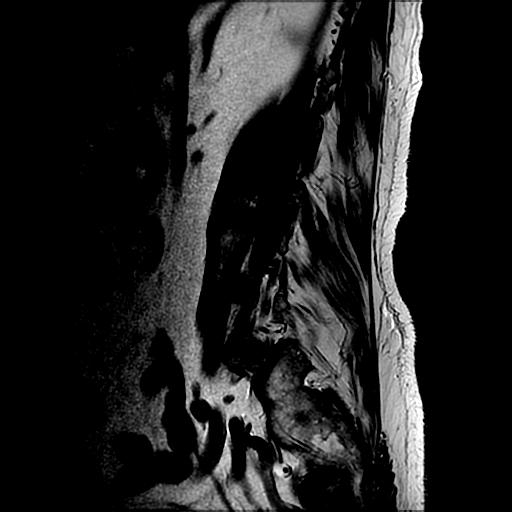
[im 13/13]
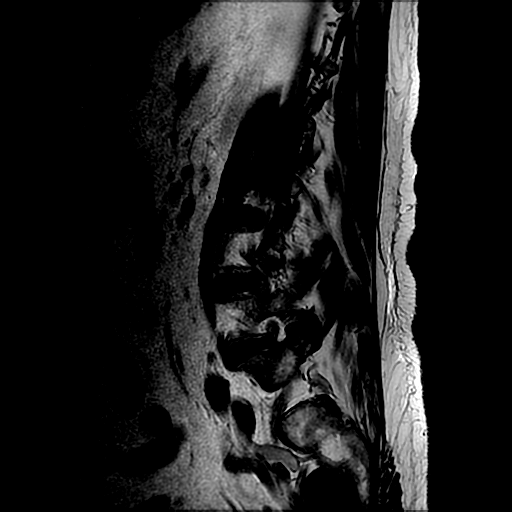

[Series 15: T2 · axial · 4.0mm · 0.51mm/px · z∈[-407,-206]mm · 5 of 36 slices shown (4 of 4)]
[im 1/36]
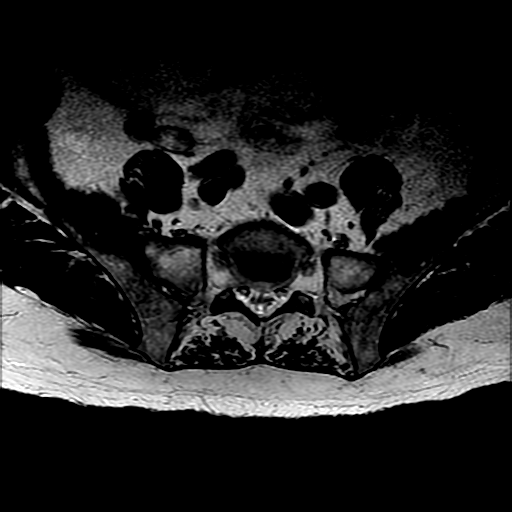
[im 9/36]
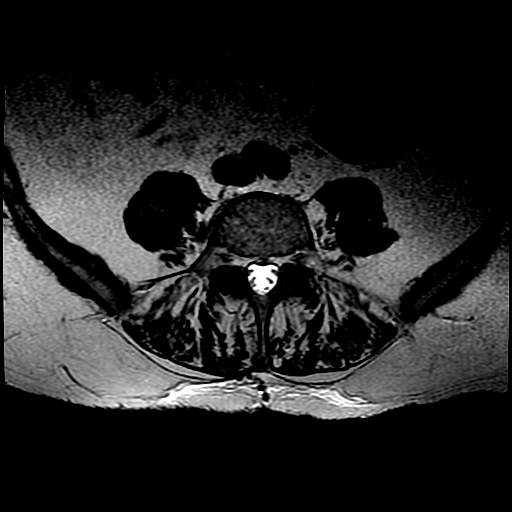
[im 18/36]
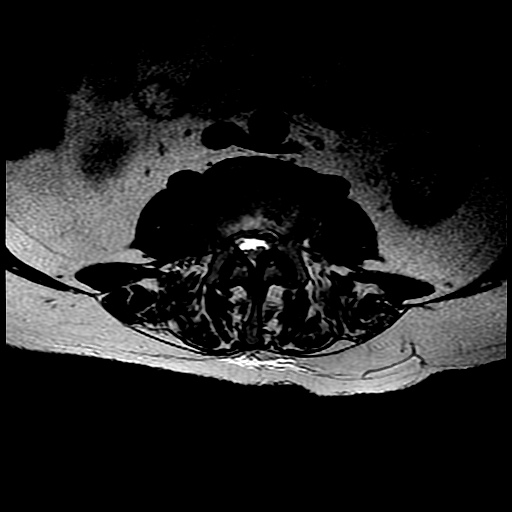
[im 27/36]
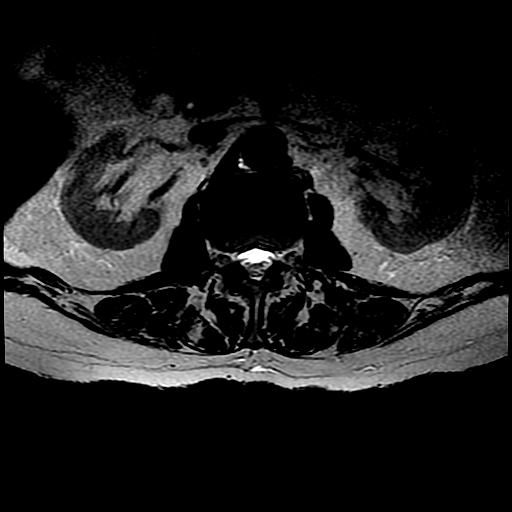
[im 36/36]
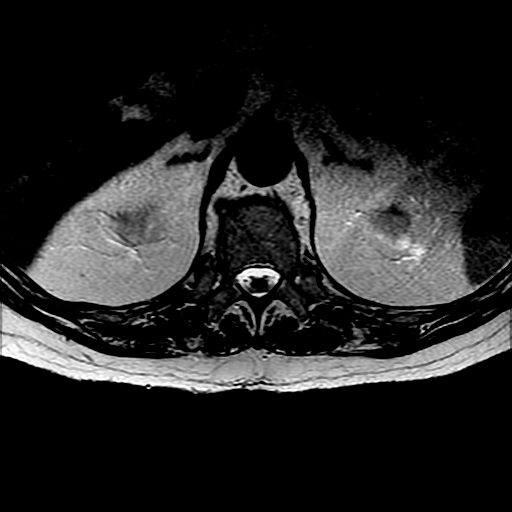

[18 of 48 positions shown; findings below may reference images not displayed]

FINDINGS: MRI THORACIC SPINE FINDINGS

Alignment: Slightly exaggerated thoracic kyphosis. Sagittal
alignment is maintained.

Vertebrae: No fracture, evidence of discitis, or bone lesion.

Cord:  Normal signal and morphology.  No abnormal enhancement.

Paraspinal and other soft tissues: Bibasilar atelectasis. Otherwise
negative.

Disc levels:

Small central disc protrusion at T5-T6. Tiny right paracentral disc
protrusion at T7-T8. No spinal canal or neuroforaminal stenosis at
any level.

MRI LUMBAR SPINE FINDINGS

Segmentation: Transitional lumbosacral anatomy with sacralization of
L5.

Alignment:  Physiologic.

Vertebrae: No fracture, evidence of discitis, or bone lesion.
Minimal degenerative marrow edema at T12-L1 and L4-L5.

Conus medullaris: Extends to the L1 level and appears normal. No
abnormal enhancement.

Paraspinal and other soft tissues: Cholelithiasis. Small bilateral
renal cysts.

Disc levels:

T12-L1:  Trace disc bulge.  No stenosis.

L1-L2: Small disc bulge. Mild bilateral lateral recess stenosis. No
central spinal canal or neuroforaminal stenosis.

L2-L3: Diffuse disc bulge and mild bilateral facet arthropathy
resulting in mild central spinal canal stenosis, mild left lateral
recess stenosis, and mild left greater than right neuroforaminal
stenosis.

L3-L4: Diffuse disc bulge and bilateral facet arthropathy resulting
in mild central spinal canal and bilateral lateral recess stenosis
and mild bilateral neuroforaminal stenosis.

L4-L5: Small diffuse disc bulge, asymmetric to the right. No
stenosis.

L5-S1:  Negative.
IMPRESSION: Thoracic spine:

1. No acute osseous abnormality.
2. Minimal degenerative disc disease. No spinal canal or
neuroforaminal stenosis at any level.

Lumbar spine:

1. No acute osseous abnormality.
2. Transitional lumbosacral anatomy with sacralization of L5.
Correlation with radiographs is recommended prior to any operative
intervention.
3. Mild multilevel degenerative disc disease throughout the lumbar
spine, worst at L2-L3 and L3-L4 where there is mild central spinal
canal and bilateral neuroforaminal stenosis.

## 2018-11-09 ENCOUNTER — Other Ambulatory Visit: Payer: Self-pay | Admitting: *Deleted

## 2018-11-09 MED ORDER — APIXABAN 5 MG PO TABS: 5.0000 mg | ORAL_TABLET | Freq: Two times a day (BID) | ORAL | 5 refills | Status: DC

## 2018-11-09 NOTE — Telephone Encounter (Signed)
Eliquis 5mg  refill request received from [facility]; pt is 78 yrs old, Wt-90.2kg, Crea-1.25 on 02/21/18, last seen by Dr. Caryl Comes on 12/08/17; will send in refill to requested pharmacy. Pt has a recall for February.

## 2019-05-13 ENCOUNTER — Other Ambulatory Visit: Payer: Self-pay

## 2019-05-13 MED ORDER — APIXABAN 5 MG PO TABS: 5.0000 mg | ORAL_TABLET | Freq: Two times a day (BID) | ORAL | 2 refills | Status: DC

## 2019-05-13 NOTE — Telephone Encounter (Signed)
Patient is overdue for labs and OV Left message with [facility] that patient will need appointment. Will give 3 month supply now

## 2019-05-13 NOTE — Telephone Encounter (Signed)
Pharmacy requesting refill eliquis 5 mg twice a day.

## 2019-06-20 ENCOUNTER — Telehealth: Payer: Self-pay | Admitting: Internal Medicine

## 2019-06-20 NOTE — Telephone Encounter (Addendum)
Reviewed the patient's chart.  He was last seen in 11/2017 by Dr. Caryl Comes. He has a history of documented a-fib and stroke per Dr. Olin Pia last office note dated 12/08/17.  At that time, Dr. Caryl Comes stopped his ASA and put him on Eliquis 5 mg BID.   Attempted to call Jacob Moores back at [facility].  No answer- I left a message to please call back.

## 2019-06-20 NOTE — Telephone Encounter (Signed)
  Kathleen Argue, RN with [facility] is calling to find out why patient is on Eliquis and if he needs to continue on it. They want to prescribe him Plavix and Aspirin but are concerned with him being on Eliquis. Please advise.

## 2019-06-20 NOTE — Telephone Encounter (Signed)
I spoke with [name] at [facility].  She states the patient is there for recurrent stroke and they are wanting to start ASA and plavix. They are unclear of his indication for eliquis.  I advised the patient is overdue for follow up, but has documented a-fib on his device.   Kelly voices understanding.

## 2019-08-16 ENCOUNTER — Other Ambulatory Visit: Payer: Self-pay

## 2019-08-16 MED ORDER — APIXABAN 5 MG PO TABS: 5.0000 mg | ORAL_TABLET | Freq: Two times a day (BID) | ORAL | 0 refills | Status: DC

## 2019-08-16 NOTE — Addendum Note (Signed)
Addended by: Derrel Nip B on: 08/16/2019 01:01 PM   Modules accepted: Orders

## 2019-08-16 NOTE — Telephone Encounter (Signed)
Pt has an appt set with Dr. Caryl Comes on 09/13/2019, will send a refill at this time and reevaluate at [place] with Caryl Comes.

## 2019-08-16 NOTE — Telephone Encounter (Addendum)
Eliquis refill request received. Pt is on Eliquis 5mg  78 yrs old, wt-88.6kg from [facility] on 08/16/2019 at [facility], Crea-1.25 on 02/21/2018-needs updated labs, last seen by Dr. Caryl Comes on 12/08/2017-overdue and needs an appt, Diagnosis-Afib and CVA. Called the pt using the 2 numbers listed due to him needing an appt with the Cardiologist and updated labs, both numbers are invalid. Per notes pt is now living in a group home per [facility] notes versus assisted living per last OV note with Cardiologist so will try to find out which one so we can contact the correct office.  Called the Pharmacy and spoke with Janett Billow who was able to provide me with some phone numbers to try to reach the pt/family. 1st number was [phone] and states invalid, 2nd number [phone] and Rolm Bookbinder and she states she is his Caregiver and she sets all his appts. Advised since I don't see a DPR we will need one signed to get further information and she verbalized understanding. She states that he has been trying to see how to get his bills down and that to hold on cause he is right here. She put the pt on the phone and I was able to speak with the pt and he was able to provide date of birt and update me on the living situation since he moved to [facility] to be closer to his son and living under the same care as his son who is disabled. Pt states that he is taking Eliquis and has been caregiver able to verify the dose and that he takes twice a day. Pt states his only blood living relative is his son and he has a sister in law, which is his wives sister.  Pt now living in [place] in a [facility] Family Living and asked me to update address and phone number which I did to [place] [address] and [phone]. Advised that a [employer] form needs to be filled out as well if he needs caregiver Elmo Putt to get info and make appt. They are both aware he needs an appt with Dr. Caryl Comes and labs. Transferred to th  main line to schedule an appt. Elmo Putt gave me the number for his PCP and that is Dr. Dallie Piles [phone] and the pt states he had labs done there recently. The number was incorrect so looked it up and it is [phone] and they stated he had labs done 08/04/2019 and will fax over to me. Will await.

## 2019-08-16 NOTE — Telephone Encounter (Signed)
*  STAT* If patient is at the pharmacy, call can be transferred to refill team.   1. Which medications need to be refilled? (please list name of each medication and dose if known) Eliquis  2. Which pharmacy/location (including street and city if local pharmacy) is medication to be sent to? [facility]   3. Do they need a 30 day or 90 day supply? [place]

## 2019-09-13 ENCOUNTER — Encounter: Payer: Medicare Other | Admitting: Internal Medicine

## 2019-09-14 ENCOUNTER — Other Ambulatory Visit: Payer: Self-pay | Admitting: Internal Medicine

## 2019-09-14 MED ORDER — APIXABAN 5 MG PO TABS: 5.0000 mg | ORAL_TABLET | Freq: Two times a day (BID) | ORAL | 1 refills | Status: DC

## 2019-09-14 MED ORDER — APIXABAN 5 MG PO TABS: 5.0000 mg | ORAL_TABLET | Freq: Two times a day (BID) | ORAL | 0 refills | Status: DC

## 2019-09-14 NOTE — Telephone Encounter (Signed)
16m 90.2kg Scr 1.2 06/20/19 Overdue for ov scheduled in jan

## 2019-09-14 NOTE — Telephone Encounter (Signed)
*  STAT* If patient is at the pharmacy, call can be transferred to refill team.   1. Which medications need to be refilled? (please list name of each medication and dose if known) Eliquis  2. Which pharmacy/location (including street and city if local pharmacy) is medication to be sent to? [facility]   3. Do they need a 30 day or 90 day supply? 30  Patient out of meds .  Group home care giver had an emergency and was unable to make appt.  Rescheduled next available .  Patient will not have meds to get to ov.

## 2019-11-10 ENCOUNTER — Encounter: Payer: Medicare Other | Admitting: Internal Medicine

## 2019-11-16 ENCOUNTER — Other Ambulatory Visit: Payer: Self-pay | Admitting: Internal Medicine

## 2019-11-16 NOTE — Telephone Encounter (Signed)
Pt last saw Dr Caryl Comes 12/08/17, pt is overdue for follow-up, pt has appt scheduled for 12/14/19.  Last labs 06/20/19 Creat 1.20 at [facility] per care everywhere, age 79, weight 88.5kg, based on specified criteria pt is on appropriate dosage of Eliquis 5mg  BID.  Will refill rx x 1 to get to upcoming appt with Dr Caryl Comes on 12/14/19.

## 2019-12-02 DIAGNOSIS — K641 Second degree hemorrhoids: Secondary | ICD-10-CM | POA: Diagnosis not present

## 2019-12-02 DIAGNOSIS — Z743 Need for continuous supervision: Secondary | ICD-10-CM | POA: Diagnosis not present

## 2019-12-02 DIAGNOSIS — K5900 Constipation, unspecified: Secondary | ICD-10-CM | POA: Diagnosis not present

## 2019-12-02 DIAGNOSIS — R58 Hemorrhage, not elsewhere classified: Secondary | ICD-10-CM | POA: Diagnosis not present

## 2019-12-02 DIAGNOSIS — I10 Essential (primary) hypertension: Secondary | ICD-10-CM | POA: Diagnosis not present

## 2019-12-12 ENCOUNTER — Other Ambulatory Visit: Payer: Self-pay | Admitting: Internal Medicine

## 2019-12-12 NOTE — Telephone Encounter (Signed)
Eliquis 5mg  refill request received, pt is 79yrs old, weight-88.5kg, Crea-1.20 on 06/20/2019 via [facility], Diagnosis-Afib, CVA, and last seen by Dr. Caryl Comes on 12/08/2017 and pending an appt on 12/14/2019. Dose is appropriate based on dosing criteria. Will send in refill to requested pharmacy.

## 2019-12-13 DIAGNOSIS — Z950 Presence of cardiac pacemaker: Secondary | ICD-10-CM | POA: Insufficient documentation

## 2019-12-13 DIAGNOSIS — I4891 Unspecified atrial fibrillation: Secondary | ICD-10-CM | POA: Insufficient documentation

## 2019-12-14 ENCOUNTER — Ambulatory Visit (INDEPENDENT_AMBULATORY_CARE_PROVIDER_SITE_OTHER): Payer: Medicare Other | Admitting: Internal Medicine

## 2019-12-14 ENCOUNTER — Encounter: Payer: Self-pay | Admitting: Internal Medicine

## 2019-12-14 ENCOUNTER — Other Ambulatory Visit: Payer: Self-pay

## 2019-12-14 VITALS — BP 124/68 | HR 75 | Ht 69.0 in | Wt 213.8 lb

## 2019-12-14 DIAGNOSIS — I480 Paroxysmal atrial fibrillation: Secondary | ICD-10-CM

## 2019-12-14 DIAGNOSIS — I442 Atrioventricular block, complete: Secondary | ICD-10-CM | POA: Diagnosis not present

## 2019-12-14 DIAGNOSIS — Z950 Presence of cardiac pacemaker: Secondary | ICD-10-CM

## 2019-12-14 NOTE — Patient Instructions (Signed)
Medication Instructions:  Your physician recommends that you continue on your current medications as directed. Please refer to the Current Medication list given to you today.  Labwork: None ordered.  Testing/Procedures: None ordered.  Follow-Up: Your physician wants you to follow-up in:  One year with Dr Caryl Comes. You will receive a reminder letter in the mail two months in advance. If you don't receive a letter, please call our office to schedule the follow-up appointment.  Remote monitoring is used to monitor your Pacemaker of ICD from home. This monitoring reduces the number of office visits required to check your device to one time per year. It allows [place] to keep an eye on the functioning of your device to ensure it is working properly.  Any Other Special Instructions Will Be Listed Below (If Applicable).  If you need a refill on your cardiac medications before your next appointment, please call your pharmacy.

## 2019-12-14 NOTE — Progress Notes (Signed)
Patient Care Team: Monico Blitz, MD as PCP - General (Internal Medicine)   HPI  [name] is a 79 y.o. 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 anticoagulation w apixoban   He was seen in the ER 1/19 for dizziness 9/18 he had been hospitalized for sepsis without an identified organism.  It was also noted to have pneumonia  No interval palpitations.  No atrial fibrillation.  No clinical bleeding.  Denies chest pain or shortness of breath.  He lives in assisted living.  Date Cr K Hgb  4/19 1.25 4.2 13.0   8/20 1.2 4.2 14.8       Thromboembolic risk factors ( age  -2, HTN-1, TIA/CVA-2, DM-1, Vasc dis-1 ) for a CHADSVASc Score of 7   Records and Results Reviewed hospital records   Past Medical History:  Diagnosis Date  . Anxiety   . Arthritis    "hands" (09-01-2016)  . BPH (benign prostatic hypertrophy)   . BRBPR (bright red blood per rectum) ~ 01/2016; 10'/18/2017  . CAD (coronary artery disease) 01-Sep-2016  . Carotid artery occlusion   . Cerebrovascular disease, unspecified   . Cervicalgia   . CHF (congestive heart failure) ([place])   . Chronic kidney disease (CKD), stage III (moderate)    [name] 11/24/2016  . Enlarged prostate   . Hypertension   . Inguinal hernia without mention of obstruction or gangrene, unilateral or unspecified, (not specified as recurrent)   . Myocardial infarction [facility]) 2014; 2015; 2016   "told I'd had the 1st 2 while in the hospital in 2016; didn't even know it" (09/01/2016)  . Other psoriasis   . Pain in limb   . Presence of permanent cardiac pacemaker   . Situational depression    "after my daughter died in 01-09-15" (Sep 01, 2016)  . Stroke [facility]) 2010; 2014   "right sided weakness since" (11/25/2016)  . Unspecified hemorrhoids without mention of complication   . Unspecified transient cerebral ischemia   . Varicose veins     Past  Surgical History:  Procedure Laterality Date  . BACK SURGERY    . CARDIAC CATHETERIZATION N/A 06/06/2015   Procedure: Left Heart Cath and Coronary Angiography;  Surgeon: Wellington Hampshire, MD;  Location: [place] CV LAB;  Service: Cardiovascular;  Laterality: N/A;  . CAROTID ENDARTERECTOMY Right 10-13-13   cea  . CATARACT EXTRACTION W/PHACO Right 12/16/2017   Procedure: CATARACT EXTRACTION PHACO AND INTRAOCULAR LENS PLACEMENT (IOC);  Surgeon: Eulogio Bear, MD;  Location: [facility] ORS;  Service: Ophthalmology;  Laterality: Right;  [place]: 00:50.8 AP% 15.3 CDE 7.79 Fluid pak lot # [id number]  . COLONOSCOPY    . COLONOSCOPY N/A 08/14/2016   Procedure: COLONOSCOPY;  Surgeon: Manus Gunning, MD;  Location: [facility] ENDOSCOPY;  Service: Gastroenterology;  Laterality: N/A;  . ENDARTERECTOMY Left 07/14/2013   Procedure: ENDARTERECTOMY CAROTID-LEFT;  Surgeon: Serafina Mitchell, MD;  Location: [place];  Service: Vascular;  Laterality: Left;  . ENDARTERECTOMY Right 10/13/2013   Procedure: RIGHT CAROTID ARTERY ENDARTERECTOMY WITH VASCU GUARD PATCH ANGIOPLASTY;  Surgeon: Serafina Mitchell, MD;  Location: [place];  Service: Vascular;  Laterality: Right;  . EP IMPLANTABLE DEVICE N/A 08/01/2016   Procedure: Pacemaker Implant;  Surgeon: Will Meredith Leeds, MD;  Location: [place] CV LAB;  Service: Cardiovascular;  Laterality: N/A;  . INGUINAL HERNIA REPAIR Left   . INSERT / REPLACE / REMOVE  PACEMAKER    . [place]   "ruptured disc"; Dr. Amado Coe  . PATCH ANGIOPLASTY Left 07/14/2013   Procedure: PATCH ANGIOPLASTY using Vascu-Guard Vascular Patch;  Surgeon: Serafina Mitchell, MD;  Location: [place];  Service: Vascular;  Laterality: Left;  Marland Kitchen VASCULAR SURGERY    . VASECTOMY      Current Outpatient Medications  Medication Sig Dispense Refill  . amLODipine (NORVASC) 10 MG tablet Take 10 mg by mouth daily.    Marland Kitchen atorvastatin (LIPITOR) 40 MG tablet Take 1 tablet (40 mg total) by mouth daily. 30 tablet  6  . ELIQUIS 5 MG TABS tablet TAKE 1 TABLET BY MOUTH TWICE DAILY. 60 tablet 0  . finasteride (PROSCAR) 5 MG tablet Take 5 mg by mouth daily.    . Glucosamine 500 MG CAPS Take 500 mg by mouth daily.    Marland Kitchen ipratropium-albuterol (DUONEB) 0.5-2.5 (3) MG/3ML SOLN Take 3 mLs by nebulization every 4 (four) hours as needed. 360 mL   . losartan (COZAAR) 50 MG tablet Take 50 mg by mouth daily.    . metFORMIN (GLUCOPHAGE) 500 MG tablet Take 1 tablet (500 mg total) by mouth 2 (two) times daily with a meal.    . mineral oil-hydrophilic petrolatum (AQUAPHOR) ointment Apply 1 application topically 2 (two) times daily as needed for dry skin. Apply to affected area on skin twice daily 1 hour after application of triamcinolone for 4 weeks.    . ondansetron (ZOFRAN) 4 MG tablet Take 4 mg by mouth every 6 (six) hours as needed for nausea.    . polyethylene glycol (MIRALAX / GLYCOLAX) packet Take 17 g by mouth daily as needed for mild constipation. 14 each 0  . triamcinolone cream (KENALOG) 0.1 % Apply 1 application topically 2 (two) times daily. Apply to affected area externally twice a day for 4 weeks    . vitamin C (ASCORBIC ACID) 500 MG tablet Take 500 mg by mouth daily.     No current facility-administered medications for this visit.    Allergies  Allergen Reactions  . Strawberry Extract Shortness Of Breath and Swelling    Lips and throat swelled  . Ace Inhibitors Other (See Comments)    Possible angioedema  . Cardura [Doxazosin Mesylate] Other (See Comments)    Erythema  . Penicillins Rash    Has patient had a PCN reaction causing immediate rash, facial/tongue/throat swelling, SOB or lightheadedness with hypotension: Yes Has patient had a PCN reaction causing severe rash involving mucus membranes or skin necrosis: No Has patient had a PCN reaction that required hospitalization No Has patient had a PCN reaction occurring within the last 10 years: No If all of the above answers are "NO", then may proceed  with Cephalosporin use.      Review of Systems negative except from HPI and PMH  Physical Exam BP 124/68   Pulse 75   Ht 5\' 9"  (1.753 m)   Wt 213 lb 12.8 oz (97 kg)   SpO2 95%   BMI 31.57 kg/m  Well developed and well nourished in no acute distress HENT normal Neck supple with JVP-flat Clear Device pocket well healed; without hematoma or erythema.  There is no tethering  Regular rate and rhythm, no   murmur Abd-soft with active BS No Clubbing cyanosis  edema Skin-warm and dry A & Oriented  Grossly normal sensory and motor function  ECG sinus with P-synchronous/ AV  pacing    Assessment and  Plan  Complete heart  block  Pacemaker-Medtronic  Atrial fibrillation-paroxysmal  Diabetes  VT nonsustained    PACs frequent have reprogrammed his device to track .  Reprogrammed the device also to keep the output below the voltage doubler  No interval atrial fibrillation  No clinical bleeding   We will try to establish pacemaker follow-up in [facility] We spent more than 50% of our >25 min visit in face to face counseling regarding the above     Current medicines are reviewed at length with the patient today .  The patient does not  have concerns regarding medicines.

## 2019-12-26 LAB — CUP PACEART INCLINIC DEVICE CHECK
Battery Remaining Longevity: 41 mo
Battery Voltage: 2.98 V
Brady Statistic AP VP Percent: 51.69 %
Brady Statistic AP VS Percent: 0.01 %
Brady Statistic AS VP Percent: 48.24 %
Brady Statistic AS VS Percent: 0.06 %
Brady Statistic RA Percent Paced: 51.3 %
Brady Statistic RV Percent Paced: 99.83 %
Date Time Interrogation Session: 20210217135000
Implantable Lead Implant Date: 20171006
Implantable Lead Implant Date: 20171006
Implantable Lead Location: 753859
Implantable Lead Location: 753860
Implantable Lead Model: 5076
Implantable Lead Model: 5076
Implantable Pulse Generator Implant Date: 20171006
Lead Channel Impedance Value: 361 Ohm
Lead Channel Impedance Value: 418 Ohm
Lead Channel Impedance Value: 551 Ohm
Lead Channel Impedance Value: 627 Ohm
Lead Channel Pacing Threshold Amplitude: 1.5 V
Lead Channel Pacing Threshold Amplitude: 2.5 V
Lead Channel Pacing Threshold Pulse Width: 0.4 ms
Lead Channel Pacing Threshold Pulse Width: 1.2 ms
Lead Channel Sensing Intrinsic Amplitude: 4.6 mV
Lead Channel Sensing Intrinsic Amplitude: 6.3 mV
Lead Channel Setting Pacing Amplitude: 1.5 V
Lead Channel Setting Pacing Amplitude: 2.5 V
Lead Channel Setting Pacing Pulse Width: 1.2 ms
Lead Channel Setting Sensing Sensitivity: 0.9 mV

## 2019-12-29 DIAGNOSIS — K30 Functional dyspepsia: Secondary | ICD-10-CM | POA: Diagnosis not present

## 2019-12-29 DIAGNOSIS — I69359 Hemiplegia and hemiparesis following cerebral infarction affecting unspecified side: Secondary | ICD-10-CM | POA: Diagnosis not present

## 2020-01-20 ENCOUNTER — Other Ambulatory Visit: Payer: Self-pay | Admitting: Internal Medicine

## 2020-01-20 NOTE — Telephone Encounter (Addendum)
Prescription refill request for Eliquis received.  Last office visit: 12/14/2019, Caryl Comes Scr: 1.20, 06/20/2019 Age: 79 y.o. Weight: 97 kg   Prescription refill sent.

## 2020-03-15 ENCOUNTER — Telehealth: Payer: Self-pay

## 2020-03-15 NOTE — Telephone Encounter (Signed)
Spoke with patient to remind of missed remote transmission 

## 2020-03-21 ENCOUNTER — Telehealth: Payer: Self-pay

## 2020-03-21 NOTE — Telephone Encounter (Signed)
Spoke with pt, he has not called [employer] to request new home remote monitor.  Provided patient with phone number to call, attempted to call number to conference patient on line, was on hold for >10 mionutes.  Pt agreed to call on his own.

## 2020-05-09 DIAGNOSIS — N1831 Chronic kidney disease, stage 3a: Secondary | ICD-10-CM | POA: Diagnosis not present

## 2020-05-09 DIAGNOSIS — G20 Parkinson's disease: Secondary | ICD-10-CM | POA: Diagnosis not present

## 2020-05-09 DIAGNOSIS — I4891 Unspecified atrial fibrillation: Secondary | ICD-10-CM | POA: Diagnosis not present

## 2020-05-09 DIAGNOSIS — Z0000 Encounter for general adult medical examination without abnormal findings: Secondary | ICD-10-CM | POA: Diagnosis not present

## 2020-05-09 DIAGNOSIS — I10 Essential (primary) hypertension: Secondary | ICD-10-CM | POA: Diagnosis not present

## 2020-07-10 DIAGNOSIS — K644 Residual hemorrhoidal skin tags: Secondary | ICD-10-CM | POA: Diagnosis not present

## 2020-07-10 DIAGNOSIS — M179 Osteoarthritis of knee, unspecified: Secondary | ICD-10-CM | POA: Diagnosis not present

## 2020-07-18 ENCOUNTER — Other Ambulatory Visit: Payer: Self-pay | Admitting: Internal Medicine

## 2020-07-30 DIAGNOSIS — M12869 Other specific arthropathies, not elsewhere classified, unspecified knee: Secondary | ICD-10-CM | POA: Diagnosis not present

## 2020-07-30 DIAGNOSIS — Z23 Encounter for immunization: Secondary | ICD-10-CM | POA: Diagnosis not present

## 2020-07-30 DIAGNOSIS — I10 Essential (primary) hypertension: Secondary | ICD-10-CM | POA: Diagnosis not present
